# Patient Record
Sex: Female | Born: 1935 | Race: White | Hispanic: No | Smoking: Never smoker
Health system: Southern US, Community
[De-identification: ages and names within clinical notes are randomized; demographics above are authoritative.]

## PROBLEM LIST (undated history)

## (undated) DIAGNOSIS — R42 Dizziness and giddiness: Secondary | ICD-10-CM

## (undated) DIAGNOSIS — I2510 Atherosclerotic heart disease of native coronary artery without angina pectoris: Secondary | ICD-10-CM

## (undated) DIAGNOSIS — D649 Anemia, unspecified: Secondary | ICD-10-CM

## (undated) DIAGNOSIS — F32A Depression, unspecified: Secondary | ICD-10-CM

## (undated) DIAGNOSIS — I10 Essential (primary) hypertension: Secondary | ICD-10-CM

## (undated) DIAGNOSIS — I4891 Unspecified atrial fibrillation: Secondary | ICD-10-CM

## (undated) DIAGNOSIS — E785 Hyperlipidemia, unspecified: Secondary | ICD-10-CM

## (undated) DIAGNOSIS — R569 Unspecified convulsions: Secondary | ICD-10-CM

## (undated) DIAGNOSIS — F0390 Unspecified dementia without behavioral disturbance: Secondary | ICD-10-CM

## (undated) DIAGNOSIS — I639 Cerebral infarction, unspecified: Secondary | ICD-10-CM

## (undated) DIAGNOSIS — F329 Major depressive disorder, single episode, unspecified: Secondary | ICD-10-CM

## (undated) DIAGNOSIS — M1990 Unspecified osteoarthritis, unspecified site: Secondary | ICD-10-CM

## (undated) DIAGNOSIS — G459 Transient cerebral ischemic attack, unspecified: Secondary | ICD-10-CM

## (undated) HISTORY — DX: Major depressive disorder, single episode, unspecified: F32.9

## (undated) HISTORY — DX: Depression, unspecified: F32.A

## (undated) HISTORY — DX: Transient cerebral ischemic attack, unspecified: G45.9

## (undated) HISTORY — PX: APPENDECTOMY: SHX54

## (undated) HISTORY — PX: BACK SURGERY: SHX140

## (undated) HISTORY — DX: Unspecified convulsions: R56.9

## (undated) HISTORY — DX: Unspecified osteoarthritis, unspecified site: M19.90

## (undated) HISTORY — PX: ABDOMINAL HYSTERECTOMY: SHX81

---

## 2004-01-29 HISTORY — PX: COLONOSCOPY: SHX174

## 2011-02-08 DIAGNOSIS — I639 Cerebral infarction, unspecified: Secondary | ICD-10-CM

## 2011-02-08 HISTORY — DX: Cerebral infarction, unspecified: I63.9

## 2011-05-23 ENCOUNTER — Encounter (HOSPITAL_COMMUNITY): Payer: Self-pay | Admitting: *Deleted

## 2011-05-23 ENCOUNTER — Emergency Department (HOSPITAL_COMMUNITY): Payer: Medicare Other

## 2011-05-23 ENCOUNTER — Inpatient Hospital Stay (HOSPITAL_COMMUNITY)
Admission: EM | Admit: 2011-05-23 | Discharge: 2011-05-26 | DRG: 065 | Disposition: A | Discharge status: Skilled Nursing Facility | Payer: Medicare Other | Attending: Internal Medicine | Admitting: Internal Medicine

## 2011-05-23 DIAGNOSIS — Z7901 Long term (current) use of anticoagulants: Secondary | ICD-10-CM

## 2011-05-23 DIAGNOSIS — I10 Essential (primary) hypertension: Secondary | ICD-10-CM | POA: Diagnosis present

## 2011-05-23 DIAGNOSIS — E119 Type 2 diabetes mellitus without complications: Secondary | ICD-10-CM | POA: Diagnosis present

## 2011-05-23 DIAGNOSIS — I6340 Cerebral infarction due to embolism of unspecified cerebral artery: Principal | ICD-10-CM | POA: Diagnosis present

## 2011-05-23 DIAGNOSIS — D649 Anemia, unspecified: Secondary | ICD-10-CM | POA: Diagnosis present

## 2011-05-23 DIAGNOSIS — F0390 Unspecified dementia without behavioral disturbance: Secondary | ICD-10-CM | POA: Diagnosis present

## 2011-05-23 DIAGNOSIS — R29810 Facial weakness: Secondary | ICD-10-CM | POA: Diagnosis present

## 2011-05-23 DIAGNOSIS — E785 Hyperlipidemia, unspecified: Secondary | ICD-10-CM | POA: Diagnosis present

## 2011-05-23 DIAGNOSIS — I639 Cerebral infarction, unspecified: Secondary | ICD-10-CM | POA: Diagnosis present

## 2011-05-23 DIAGNOSIS — I2510 Atherosclerotic heart disease of native coronary artery without angina pectoris: Secondary | ICD-10-CM | POA: Diagnosis present

## 2011-05-23 DIAGNOSIS — Z79899 Other long term (current) drug therapy: Secondary | ICD-10-CM

## 2011-05-23 DIAGNOSIS — I4891 Unspecified atrial fibrillation: Secondary | ICD-10-CM | POA: Diagnosis present

## 2011-05-23 DIAGNOSIS — G459 Transient cerebral ischemic attack, unspecified: Secondary | ICD-10-CM

## 2011-05-23 DIAGNOSIS — E871 Hypo-osmolality and hyponatremia: Secondary | ICD-10-CM | POA: Diagnosis present

## 2011-05-23 DIAGNOSIS — I480 Paroxysmal atrial fibrillation: Secondary | ICD-10-CM | POA: Diagnosis present

## 2011-05-23 DIAGNOSIS — Z794 Long term (current) use of insulin: Secondary | ICD-10-CM

## 2011-05-23 DIAGNOSIS — Z7982 Long term (current) use of aspirin: Secondary | ICD-10-CM

## 2011-05-23 DIAGNOSIS — Z87440 Personal history of urinary (tract) infections: Secondary | ICD-10-CM

## 2011-05-23 DIAGNOSIS — I69959 Hemiplegia and hemiparesis following unspecified cerebrovascular disease affecting unspecified side: Secondary | ICD-10-CM

## 2011-05-23 HISTORY — DX: Unspecified atrial fibrillation: I48.91

## 2011-05-23 HISTORY — DX: Cerebral infarction, unspecified: I63.9

## 2011-05-23 HISTORY — DX: Anemia, unspecified: D64.9

## 2011-05-23 HISTORY — DX: Essential (primary) hypertension: I10

## 2011-05-23 HISTORY — DX: Hyperlipidemia, unspecified: E78.5

## 2011-05-23 HISTORY — DX: Atherosclerotic heart disease of native coronary artery without angina pectoris: I25.10

## 2011-05-23 LAB — COMPREHENSIVE METABOLIC PANEL
Alkaline Phosphatase: 85 U/L (ref 39–117)
CO2: 19 mEq/L (ref 19–32)
Chloride: 101 mEq/L (ref 96–112)
GFR calc Af Amer: 55 mL/min — ABNORMAL LOW (ref >90)
Potassium: 4 mEq/L (ref 3.5–5.1)
Sodium: 134 mEq/L — ABNORMAL LOW (ref 135–145)

## 2011-05-23 LAB — DIFFERENTIAL
Basophils Absolute: 0 10*3/uL (ref 0.0–0.1)
Basophils Relative: 0 % (ref 0–1)
Lymphocytes Relative: 22 % (ref 12–46)
Neutro Abs: 5.2 10*3/uL (ref 1.7–7.7)
Neutrophils Relative %: 71 % (ref 43–77)

## 2011-05-23 LAB — URINALYSIS, ROUTINE W REFLEX MICROSCOPIC
Glucose, UA: NEGATIVE mg/dL
Leukocytes, UA: NEGATIVE
Protein, ur: NEGATIVE mg/dL
Urobilinogen, UA: 0.2 mg/dL (ref 0.0–1.0)

## 2011-05-23 LAB — APTT: aPTT: 33 seconds (ref 24–37)

## 2011-05-23 LAB — CBC
HCT: 31.6 % — ABNORMAL LOW (ref 36.0–46.0)
Platelets: 222 10*3/uL (ref 150–400)
RDW: 14.5 % (ref 11.5–15.5)
WBC: 7.3 10*3/uL (ref 4.0–10.5)

## 2011-05-23 LAB — CK TOTAL AND CKMB (NOT AT ARMC)
CK, MB: 1.8 ng/mL (ref 0.3–4.0)
Total CK: 74 U/L (ref 7–177)

## 2011-05-23 LAB — PROTIME-INR
INR: 1.61 — ABNORMAL HIGH (ref 0.00–1.49)
Prothrombin Time: 19.4 seconds — ABNORMAL HIGH (ref 11.6–15.2)

## 2011-05-23 LAB — RAPID URINE DRUG SCREEN, HOSP PERFORMED: Amphetamines: NOT DETECTED

## 2011-05-23 LAB — GLUCOSE, CAPILLARY: Glucose-Capillary: 137 mg/dL — ABNORMAL HIGH (ref 70–99)

## 2011-05-23 LAB — TROPONIN I: Troponin I: <0.3 ng/mL (ref <0.30)

## 2011-05-23 MED ORDER — ONDANSETRON HCL 4 MG/2ML IJ SOLN
4.0000 mg | Freq: Once | INTRAMUSCULAR | Status: AC
  Administered 2011-05-23: 4 mg via INTRAVENOUS
  Filled 2011-05-23: qty 2

## 2011-05-23 NOTE — ED Notes (Signed)
Patient transported to CT 

## 2011-05-23 NOTE — ED Notes (Signed)
Patient returned from CT scan via stretcher.

## 2011-05-23 NOTE — ED Notes (Signed)
Difficulty with  Speech at home.  Onset 12 n

## 2011-05-23 NOTE — ED Provider Notes (Signed)
History    This chart was scribed for Hilario Quarry, MD, MD by Smitty Pluck. The patient was seen in room APA02 and the patient's care was started at 6:46PM.   CSN: [id number]  Arrival date & time 05/23/11  1758   First MD Initiated Contact with Patient 05/23/11 1834      No chief complaint on file.   (Consider location/radiation/quality/duration/timing/severity/associated sxs/prior treatment) The history is provided by the patient.   Helen [name] is a 78 y.o. female who presents to the Emergency Department complaining of possible TIA onset today around 2:00PM. Family reports that her right hand started to get numb and she had difficulty speaking. She had stroke in January 2013 (left side). Was at [facility]. She was just released from rehab 2 days ago. Home health nurse came today around 4:30PM and recommended that she should come to ED. She was diagnosed with UTI last week. She has hx of UTI. Pt is diabetic. Pt was normal until today. She reports that she has mild headache. Pt was ambulatory today.   Past Medical History  Diagnosis Date  . Stroke   . Atrial fibrillation   . Diabetes mellitus   . Hypertension   . Coronary artery disease     Past Surgical History  Procedure Date  . Appendectomy   . Abdominal hysterectomy     History reviewed. No pertinent family history.  History  Substance Use Topics  . Smoking status: Never Smoker   . Smokeless tobacco: Not on file  . Alcohol Use: No    OB History    Grav Para Term Preterm Abortions TAB SAB Ect Mult Living                  Review of Systems  All other systems reviewed and are negative.  10 Systems reviewed and all are negative for acute change except as noted in the HPI.    Allergies  Ciprofloxacin and Niacin and related  Home Medications  No current outpatient prescriptions on file.  BP 167/92  Pulse 70  Temp(Src) 98.9 F (37.2 C) (Oral)  Resp 20  Ht 5\' 4"  (1.626 m)  Wt 162 lb (73.483 kg)  BMI  27.81 kg/m2  SpO2 94%  Physical Exam  Nursing note and vitals reviewed. Constitutional: She is oriented to person, place, and time. She appears well-developed and well-nourished. No distress.  HENT:  Head: Normocephalic and atraumatic.  Cardiovascular: Normal rate, regular rhythm and normal heart sounds.   Pulmonary/Chest: Effort normal and breath sounds normal. No respiratory distress.  Abdominal: Soft. She exhibits no distension.  Neurological: She is alert and oriented to person, place, and time. No cranial nerve deficit.       No palmar drift   Skin: Skin is warm and dry.  Psychiatric: She has a normal mood and affect. Her behavior is normal.    ED Course  Procedures (including critical care time) DIAGNOSTIC STUDIES: Oxygen Saturation is 94% on [place], adequate by my interpretation.    COORDINATION OF CARE: 6:50PM EDP discusses pt ED treatment with pt and pt family  9:10PM EDP ordered medication: zofran 4 mg    Labs Reviewed  PROTIME-INR - Abnormal; Notable for the following:    Prothrombin Time 19.4 (*)    INR 1.61 (*)    All other components within normal limits  CBC - Abnormal; Notable for the following:    RBC 3.73 (*)    Hemoglobin 10.7 (*)    HCT 31.6 (*)  All other components within normal limits  COMPREHENSIVE METABOLIC PANEL - Abnormal; Notable for the following:    Sodium 134 (*)    Glucose, Bld 181 (*)    Total Bilirubin 0.2 (*)    GFR calc non Af Amer 47 (*)    GFR calc Af Amer 55 (*)    All other components within normal limits  GLUCOSE, CAPILLARY - Abnormal; Notable for the following:    Glucose-Capillary 137 (*)    All other components within normal limits  APTT  DIFFERENTIAL  CK TOTAL AND CKMB  TROPONIN I  URINE RAPID DRUG SCREEN (HOSP PERFORMED)  URINALYSIS, ROUTINE W REFLEX MICROSCOPIC   Ct Head Wo Contrast  05/23/2011  *RADIOLOGY REPORT*  Clinical Data: Right hand numbness.  Difficulty speaking. Confusion.  CT HEAD WITHOUT CONTRAST   Technique:  Contiguous axial images were obtained from the base of the skull through the vertex without contrast.  Comparison: None.  Findings: There is cortical atrophy with extensive hypoattenuation in the subcortical and periventricular deep white matter.  No evidence of acute infarction, hemorrhage, mass lesion, mass effect, midline shift or abnormal extra-axial fluid collection.  No hydrocephalus or pneumocephalus.  The calvarium is intact.  IMPRESSION: No acute finding.  Atrophy and chronic microvascular ischemic change.  Original Report Authenticated By: Bernadene Bell. Maricela Curet, M.D.     No diagnosis found.    MDM  Patient with normal exam here. CT of the head is negative. Patient is reported had a recent previous workup at outlying hospital for stroke. I discussed the patient's care with Dr. Rito Ehrlich on. She'll be admitted tonight for further evaluation an MRI in the morning.  Hilario Quarry, MD 05/23/11 2236

## 2011-05-23 NOTE — ED Notes (Signed)
Patient medicated for continued nausea and dry heaves.  Patient states she has a mild headache.

## 2011-05-23 NOTE — ED Notes (Signed)
Patient vomited small amount of clear liquid

## 2011-05-23 NOTE — ED Notes (Signed)
Patient given ice water per request and ok'd by Dr. Rosalia Hammers.

## 2011-05-24 ENCOUNTER — Inpatient Hospital Stay (HOSPITAL_COMMUNITY): Payer: Medicare Other

## 2011-05-24 ENCOUNTER — Encounter (HOSPITAL_COMMUNITY): Payer: Self-pay | Admitting: Internal Medicine

## 2011-05-24 ENCOUNTER — Inpatient Hospital Stay (HOSPITAL_COMMUNITY)
Admit: 2011-05-24 | Discharge: 2011-05-24 | Disposition: A | Discharge status: Home / Self Care | Payer: Medicare Other | Attending: Internal Medicine | Admitting: Internal Medicine

## 2011-05-24 DIAGNOSIS — Z7901 Long term (current) use of anticoagulants: Secondary | ICD-10-CM

## 2011-05-24 DIAGNOSIS — E119 Type 2 diabetes mellitus without complications: Secondary | ICD-10-CM | POA: Diagnosis present

## 2011-05-24 DIAGNOSIS — G459 Transient cerebral ischemic attack, unspecified: Secondary | ICD-10-CM

## 2011-05-24 DIAGNOSIS — I480 Paroxysmal atrial fibrillation: Secondary | ICD-10-CM | POA: Diagnosis present

## 2011-05-24 DIAGNOSIS — F0390 Unspecified dementia without behavioral disturbance: Secondary | ICD-10-CM | POA: Diagnosis present

## 2011-05-24 DIAGNOSIS — I10 Essential (primary) hypertension: Secondary | ICD-10-CM | POA: Diagnosis present

## 2011-05-24 DIAGNOSIS — I359 Nonrheumatic aortic valve disorder, unspecified: Secondary | ICD-10-CM

## 2011-05-24 DIAGNOSIS — D649 Anemia, unspecified: Secondary | ICD-10-CM | POA: Diagnosis present

## 2011-05-24 HISTORY — DX: Transient cerebral ischemic attack, unspecified: G45.9

## 2011-05-24 LAB — RETICULOCYTES
RBC.: 3.33 MIL/uL — ABNORMAL LOW (ref 3.87–5.11)
Retic Ct Pct: 1.2 % (ref 0.4–3.1)

## 2011-05-24 LAB — GLUCOSE, CAPILLARY
Glucose-Capillary: 119 mg/dL — ABNORMAL HIGH (ref 70–99)
Glucose-Capillary: 137 mg/dL — ABNORMAL HIGH (ref 70–99)
Glucose-Capillary: 149 mg/dL — ABNORMAL HIGH (ref 70–99)

## 2011-05-24 LAB — LIPID PANEL
Cholesterol: 223 mg/dL — ABNORMAL HIGH (ref 0–200)
Triglycerides: 156 mg/dL — ABNORMAL HIGH (ref <150)

## 2011-05-24 LAB — IRON AND TIBC
Saturation Ratios: 29 % (ref 20–55)
UIBC: 219 ug/dL (ref 125–400)

## 2011-05-24 LAB — HEMOGLOBIN A1C: Hgb A1c MFr Bld: 6.5 % — ABNORMAL HIGH (ref <5.7)

## 2011-05-24 MED ORDER — INSULIN ASPART 100 UNIT/ML ~~LOC~~ SOLN
0.0000 [IU] | Freq: Three times a day (TID) | SUBCUTANEOUS | Status: DC
  Administered 2011-05-24 – 2011-05-25 (×2): 2 [IU] via SUBCUTANEOUS
  Administered 2011-05-26: 3 [IU] via SUBCUTANEOUS

## 2011-05-24 MED ORDER — FUROSEMIDE 20 MG PO TABS
20.0000 mg | ORAL_TABLET | Freq: Every day | ORAL | Status: DC
  Administered 2011-05-24 – 2011-05-26 (×3): 20 mg via ORAL
  Filled 2011-05-24 (×3): qty 1

## 2011-05-24 MED ORDER — SODIUM CHLORIDE 0.9 % IJ SOLN
3.0000 mL | Freq: Two times a day (BID) | INTRAMUSCULAR | Status: DC
  Administered 2011-05-24 – 2011-05-26 (×5): 3 mL via INTRAVENOUS
  Filled 2011-05-24 (×6): qty 3

## 2011-05-24 MED ORDER — LEVETIRACETAM 500 MG PO TABS
500.0000 mg | ORAL_TABLET | Freq: Two times a day (BID) | ORAL | Status: DC
  Administered 2011-05-24 – 2011-05-26 (×6): 500 mg via ORAL
  Filled 2011-05-24 (×6): qty 1

## 2011-05-24 MED ORDER — INSULIN ASPART 100 UNIT/ML ~~LOC~~ SOLN: 0.0000 [IU] | Freq: Every day | SUBCUTANEOUS | Status: DC

## 2011-05-24 MED ORDER — ATORVASTATIN CALCIUM 10 MG PO TABS
10.0000 mg | ORAL_TABLET | Freq: Every day | ORAL | Status: DC
  Administered 2011-05-24 – 2011-05-25 (×2): 10 mg via ORAL
  Filled 2011-05-24 (×2): qty 1

## 2011-05-24 MED ORDER — HYDRALAZINE HCL 25 MG PO TABS
50.0000 mg | ORAL_TABLET | Freq: Three times a day (TID) | ORAL | Status: DC
  Administered 2011-05-24: 50 mg via ORAL
  Filled 2011-05-24 (×2): qty 2

## 2011-05-24 MED ORDER — WARFARIN - PHARMACIST DOSING INPATIENT
Freq: Every day | Status: DC
  Administered 2011-05-24: 18:00:00

## 2011-05-24 MED ORDER — WARFARIN SODIUM 7.5 MG PO TABS: 7.5000 mg | ORAL_TABLET | Freq: Once | ORAL | Status: DC

## 2011-05-24 MED ORDER — METOPROLOL TARTRATE 25 MG PO TABS
25.0000 mg | ORAL_TABLET | Freq: Two times a day (BID) | ORAL | Status: DC
  Administered 2011-05-24: 25 mg via ORAL
  Filled 2011-05-24 (×2): qty 1

## 2011-05-24 MED ORDER — WARFARIN SODIUM 5 MG PO TABS
5.0000 mg | ORAL_TABLET | Freq: Once | ORAL | Status: AC
  Administered 2011-05-24: 5 mg via ORAL
  Filled 2011-05-24: qty 1

## 2011-05-24 MED ORDER — ASPIRIN 325 MG PO TABS
325.0000 mg | ORAL_TABLET | Freq: Every day | ORAL | Status: DC
  Administered 2011-05-24 – 2011-05-25 (×2): 325 mg via ORAL
  Filled 2011-05-24 (×2): qty 1

## 2011-05-24 MED ORDER — ESCITALOPRAM OXALATE 10 MG PO TABS
5.0000 mg | ORAL_TABLET | Freq: Every day | ORAL | Status: DC
  Administered 2011-05-24 – 2011-05-26 (×3): 5 mg via ORAL
  Filled 2011-05-24 (×3): qty 1

## 2011-05-24 MED ORDER — DONEPEZIL HCL 5 MG PO TABS
10.0000 mg | ORAL_TABLET | Freq: Every day | ORAL | Status: DC
  Administered 2011-05-24 – 2011-05-25 (×2): 10 mg via ORAL
  Filled 2011-05-24 (×2): qty 2

## 2011-05-24 MED ORDER — INSULIN GLARGINE 100 UNIT/ML ~~LOC~~ SOLN
10.0000 [IU] | Freq: Every day | SUBCUTANEOUS | Status: DC
  Administered 2011-05-24 – 2011-05-25 (×2): 10 [IU] via SUBCUTANEOUS

## 2011-05-24 MED ORDER — LISINOPRIL 10 MG PO TABS
40.0000 mg | ORAL_TABLET | Freq: Every day | ORAL | Status: DC
Start: 2011-05-24 — End: 2011-05-24
  Filled 2011-05-24: qty 4

## 2011-05-24 MED ORDER — POTASSIUM CHLORIDE CRYS ER 20 MEQ PO TBCR
20.0000 meq | EXTENDED_RELEASE_TABLET | Freq: Every day | ORAL | Status: DC
  Administered 2011-05-24 – 2011-05-26 (×3): 20 meq via ORAL
  Filled 2011-05-24 (×3): qty 1

## 2011-05-24 MED ORDER — FOLIC ACID 1 MG PO TABS
1.0000 mg | ORAL_TABLET | Freq: Every day | ORAL | Status: DC
  Administered 2011-05-24 – 2011-05-26 (×3): 1 mg via ORAL
  Filled 2011-05-24 (×3): qty 1

## 2011-05-24 MED ORDER — ACETAMINOPHEN 325 MG PO TABS: 650.0000 mg | ORAL_TABLET | ORAL | Status: DC | PRN

## 2011-05-24 NOTE — Evaluation (Signed)
Occupational Therapy Evaluation Patient Details Name: Helen [name] MRN: [id number] DOB: December 07, 1924 Today's Date: 05/24/2011 OT Eval [id number] Problem List:  Patient Active Problem List  Diagnoses  . TIA (transient ischemic attack)  . Dementia  . DM type 2 (diabetes mellitus, type 2)  . HTN (hypertension)  . Paroxysmal atrial fibrillation  . Anticoagulant long-term use  . Anemia    Past Medical History:  Past Medical History  Diagnosis Date  . Stroke   . Atrial fibrillation   . Diabetes mellitus   . Hypertension   . Coronary artery disease    Past Surgical History:  Past Surgical History  Procedure Date  . Appendectomy   . Abdominal hysterectomy     OT Assessment/Plan/Recommendation OT Assessment Clinical Impression Statement: A:  Patient presents with decreased ability to process information, complete BADLs, and decreased safety due to CVA vs TIA. OT Recommendation/Assessment: Patient will need skilled OT in the acute care venue OT Problem List: Decreased activity tolerance;Impaired balance (sitting and/or standing);Decreased coordination;Decreased cognition;Decreased knowledge of precautions;Impaired UE functional use;Impaired tone OT Therapy Diagnosis : Apraxia OT Plan OT Frequency: Min 2X/week OT Treatment/Interventions: Self-care/ADL training;Therapeutic exercise;Neuromuscular education;Therapeutic activities;Patient/family education;Balance training OT Recommendation Recommendations for Other Services: Speech consult Follow Up Recommendations: Inpatient Rehab Equipment Recommended: Defer to next venue Individuals Consulted Consulted and Agree with Results and Recommendations: Patient;Family member/caregiver Family Member Consulted: son present for beginning of treatment session OT Goals Acute Rehab OT Goals OT Goal Formulation: With patient Time For Goal Achievement: 2 weeks ADL Goals Pt Will Perform Grooming: with set-up;Sitting at sink ADL Goal: Grooming -  Progress: Goal set today Pt Will Perform Upper Body Dressing: with supervision;Sitting, chair ADL Goal: Upper Body Dressing - Progress: Goal set today Pt Will Perform Lower Body Dressing: with set-up ADL Goal: Lower Body Dressing - Progress: Goal set today Miscellaneous OT Goals Miscellaneous OT Goal #1: Patient will improve GMC in BUE from fair to good for increased ability to complete BADLs independently and safely. OT Goal: Miscellaneous Goal #1 - Progress: Goal set today  OT Evaluation Precautions/Restrictions  Precautions Precautions: Fall Restrictions Weight Bearing Restrictions: No Prior Functioning Home Living Lives With: Son Available Help at Discharge: Family Type of Home: House Home Access: Stairs to enter Secretary/administrator of Steps: 5 Entrance Stairs-Rails: Right Home Layout: One level Firefighter: Standard Home Adaptive Equipment: Walker - rolling Prior Function Level of Independence: Needs assistance Able to Take Stairs?: Yes Driving: No Vocation: Retired Comments: Helen [name] was discharged from rehabilitation at [facility] on Saturday at [facility] level for ADLs, according to their records.    ADL ADL Grooming: Performed;Supervision/safety Grooming Details (indicate cue type and reason): OTR/L handed comb to patient.  She was able to comb hair with vg to remain on task.  She repeately closed her eyes and drifted off to sleep while combing her hair. Where Assessed - Grooming: Sitting, chair Lower Body Dressing: Performed;Supervision/safety Lower Body Dressing Details (indicate cue type and reason): Patient doffed R slipper sock with SBA for safety.  When prompted to don the sock, she began to put it on her left foot, which already had a sock on it.  I cued her to place the sock on her right foot and she was then able to do so with SBA for safety. Where Assessed - Lower Body Dressing: Sitting, chair ADL Comments: Patient very lethargic during evaluation.  I  requested that she wash her face, however she declined doing so. Vision/Perception  Vision - History Baseline Vision: Wears  glasses all the time Cognition Cognition Arousal/Alertness: Awake/alert Overall Cognitive Status: Appears within functional limits for tasks assessed Orientation Level: Oriented X4 Cognition - Other Comments: history of dementia.  When asked to state her name and the current month she was able to do so.  When asked our current location, her answer came out very jumbled.  I gave her 2 choices ([place] or [facility]), she was then able to clearly state [place] [facility]. Sensation/Coordination Sensation Light Touch: Appears Intact Coordination Gross Motor Movements are Fluid and Coordinated: No (Helen [name] had difficulty smoothly reaching out in front of) Fine Motor Movements are Fluid and Coordinated: No Coordination and Movement Description: Patient had difficulty smoothly reaching out in front of her body and touching various body parts when directed by therapist.  Dysdiadochokinesia present L>R with rapid supination-pronation assessment. Finger Nose Finger Test: very slow, unable to correctly reach to therapist's finger, as her eyes were closed.  When prompted to keep eyes open and look for target of therapists fingers, she was still unable to do so accurately. Extremity Assessment RUE Assessment RUE Assessment: Exceptions to [facility] RUE AROM (degrees) RUE Overall AROM Comments: Bilateral AROM at times was [facility] and at times decreased to 50%, may be due to fatigue or difficulty processing information, exact reason unclear. RUE Strength RUE Overall Strength Comments: bilateral grip is equal and WFL. RUE Tone RUE Tone Comments: BUE Brunnstrom stage V. LUE Assessment LUE Assessment: Not tested Mobility  Bed Mobility Bed Mobility: Yes Rolling Right: 7: Independent Rolling Left: 7: Independent Sitting - Scoot to Edge of Bed: 6: Modified independent  (Device/Increase time) Transfers Sit to Stand: 6: Modified independent (Device/Increase time) Stand to Sit: 7: Independent Exercises   End of Session OT - End of Session Activity Tolerance: Patient limited by fatigue Patient left: in chair Nurse Communication: Other (comment) (Notified RN that patient was in chair and drifting to sleep.) General Behavior During Session: Lethargic Cognition: Impaired, at baseline   Shirlean Mylar, OTR/L  05/24/2011, 12:02 PM

## 2011-05-24 NOTE — H&P (Signed)
Helen [name] is an 78 y.o. female.    PCP: Arelia Sneddon, MD, MD   Chief Complaint: Difficulty in speech  HPI: This is 78 year old, white female with a past medical history of dementia, hypertension, diabetes, atrial fibrillation, recent stroke, who was in her usual state of health earlier today when she apparently had the numbness in the right hand and had difficulty speaking. Patient apparently, had stroke in January and was admitted at [facility]. She went to rehabilitation and was released from rehabilitation about 2 days ago. Home health nurse came to see the patient today, and when the symptoms were reported to her she recommended that the patient come in to the emergency department. Patient was apparently treated for a urinary tract infection last week. She reported to the emergency department that she had a mild headache. Currently, patient does not answer many questions. She is cooperative with examination but doesn't want to speak. It is could this could be because of her previous stroke, although not completely aware of her baseline. Hence history is limited as no family is available either. Pt was ambulatory today.  Home Medications: Prior to Admission medications   Medication Sig Start Date End Date Taking? Authorizing Provider  aspirin EC 81 MG tablet Take 81 mg by mouth every morning. At 700   Yes Historical Provider, MD  B Complex-C (SUPER B COMPLEX) TABS Take 1 tablet by mouth every morning. At 700   Yes Historical Provider, MD  calcium carbonate (OS-CAL) 600 MG TABS Take 600 mg by mouth 2 (two) times daily with a meal.   Yes Historical Provider, MD  donepezil (ARICEPT) 10 MG tablet Take 10 mg by mouth.   Yes Historical Provider, MD  escitalopram (LEXAPRO) 5 MG tablet Take 5 mg by mouth every morning.   Yes Historical Provider, MD  folic acid (FOLVITE) 1 MG tablet Take 1 mg by mouth every morning. At 700   Yes Historical Provider, MD  furosemide (LASIX) 20 MG  tablet Take 20 mg by mouth every morning. At 700   Yes Historical Provider, MD  hydrALAZINE (APRESOLINE) 50 MG tablet Take 50 mg by mouth every 8 (eight) hours.   Yes Historical Provider, MD  insulin glargine (LANTUS SOLOSTAR) 100 UNIT/ML injection Inject 20 Units into the skin at bedtime as needed. **If blood sugars are less than 200--DO NOT GIVE INJECTION**   Yes Historical Provider, MD  levETIRAcetam (KEPPRA) 500 MG tablet Take 500 mg by mouth 2 (two) times daily.   Yes Historical Provider, MD  lisinopril (PRINIVIL,ZESTRIL) 40 MG tablet Take 40 mg by mouth every morning. At 700   Yes Historical Provider, MD  metoprolol tartrate (LOPRESSOR) 25 MG tablet Take 25 mg by mouth 2 (two) times daily.   Yes Historical Provider, MD  Multiple Vitamins-Minerals (CENTRUM SILVER ULTRA WOMENS PO) Take 1 tablet by mouth every morning. At 700   Yes Historical Provider, MD  oxybutynin (DITROPAN-XL) 10 MG 24 hr tablet Take 10 mg by mouth every morning. At 700   Yes Historical Provider, MD  potassium chloride SA (K-DUR,KLOR-CON) 20 MEQ tablet Take 20 mEq by mouth every morning. At 700   Yes Historical Provider, MD  sulfamethoxazole-trimethoprim (BACTRIM DS,SEPTRA DS) 800-160 MG per tablet Take 1 tablet by mouth 2 (two) times daily.   Yes Historical Provider, MD  warfarin (COUMADIN) 2.5 MG tablet Take 2.5 mg by mouth daily. Take one tablet in addition to Coumadin 4mg  on Thursdays and Saturdays   Yes Historical Provider, MD  warfarin (COUMADIN) 3 MG tablet Take 3 mg by mouth See admin instructions. Take one tablet in addition to Coumadin 4mg  for  A total of 7mg  on Mondays, Tuesdays, Wednesdays, Fridays, and Sundays of each week.   Yes Historical Provider, MD  warfarin (COUMADIN) 4 MG tablet Take 4 mg by mouth See admin instructions. Take one tablet by mouth in addition to Coumadin 2.5mg  for a total of 6.5 mg on Thursdays and Saturdays.   Yes Historical Provider, MD  zolpidem (AMBIEN) 5 MG tablet Take 5 mg by mouth at  bedtime.   Yes Historical Provider, MD    Allergies:  Allergies  Allergen Reactions  . Ciprofloxacin     Patient states that she may have had a stroke due to this medication  . Niacin And Related Hives    Past Medical History: Past Medical History  Diagnosis Date  . Stroke   . Atrial fibrillation   . Diabetes mellitus   . Hypertension   . Coronary artery disease     Past Surgical History  Procedure Date  . Appendectomy   . Abdominal hysterectomy     Social History:  reports that she has never smoked. She does not have any smokeless tobacco history on file. She reports that she does not drink alcohol or use illicit drugs.  Family History: History reviewed. No pertinent family history.  Review of Systems - unobtainable from patient due to mental status  Physical Examination Blood pressure 149/70, pulse 80, temperature 99.3 F (37.4 C), temperature source Oral, resp. rate 18, height 5\' 5"  (1.651 m), weight 73.3 kg (161 lb 9.6 oz), SpO2 96.00%.  General appearance: alert, cooperative and no distress Head: Normocephalic, without obvious abnormality, atraumatic Eyes: conjunctivae/corneas clear. PERRL, EOM's intact. Fundi benign. Throat: lips, mucosa, and tongue normal; teeth and gums normal Neck: no adenopathy, no carotid bruit, no JVD, supple, symmetrical, trachea midline and thyroid not enlarged, symmetric, no tenderness/mass/nodules Resp: clear to auscultation bilaterally Cardio: regular rate and rhythm, S1, S2 normal, no murmur, click, rub or gallop GI: soft, non-tender; bowel sounds normal; no masses,  no organomegaly Extremities: extremities normal, atraumatic, no cyanosis or edema Pulses: 2+ and symmetric Skin: Skin color, texture, turgor normal. No rashes or lesions Lymph nodes: Cervical, supraclavicular, and axillary nodes normal. Neurologic: Crania nerves appear to be intact. However, the patient refused to cooperate with part of the examination. She does have  equal strength bilaterally. Reflexes were brisk on the left side. No obvious pronator drift was identified. Exam is limited.  Laboratory Data: Results for orders placed during the hospital encounter of 05/23/11 (from the past 48 hour(s))  GLUCOSE, CAPILLARY     Status: Abnormal   Collection Time   05/23/11  7:06 PM      Component Value Range Comment   Glucose-Capillary 137 (*) 70 - 99 (mg/dL)   URINE RAPID DRUG SCREEN (HOSP PERFORMED)     Status: Normal   Collection Time   05/23/11  7:24 PM      Component Value Range Comment   Opiates NONE DETECTED  NONE DETECTED     Cocaine NONE DETECTED  NONE DETECTED     Benzodiazepines NONE DETECTED  NONE DETECTED     Amphetamines NONE DETECTED  NONE DETECTED     Tetrahydrocannabinol NONE DETECTED  NONE DETECTED     Barbiturates NONE DETECTED  NONE DETECTED    URINALYSIS, ROUTINE W REFLEX MICROSCOPIC     Status: Normal   Collection Time   05/23/11  7:24 PM      Component Value Range Comment   Color, Urine YELLOW  YELLOW     APPearance CLEAR  CLEAR     Specific Gravity, Urine 1.010  1.005 - 1.030     pH 7.0  5.0 - 8.0     Glucose, UA NEGATIVE  NEGATIVE (mg/dL)    Hgb urine dipstick NEGATIVE  NEGATIVE     Bilirubin Urine NEGATIVE  NEGATIVE     Ketones, ur NEGATIVE  NEGATIVE (mg/dL)    Protein, ur NEGATIVE  NEGATIVE (mg/dL)    Urobilinogen, UA 0.2  0.0 - 1.0 (mg/dL)    Nitrite NEGATIVE  NEGATIVE     Leukocytes, UA NEGATIVE  NEGATIVE  MICROSCOPIC NOT DONE ON URINES WITH NEGATIVE PROTEIN, BLOOD, LEUKOCYTES, NITRITE, OR GLUCOSE <1000 mg/dL.  PROTIME-INR     Status: Abnormal   Collection Time   05/23/11  7:55 PM      Component Value Range Comment   Prothrombin Time 19.4 (*) 11.6 - 15.2 (seconds)    INR 1.61 (*) 0.00 - 1.49    APTT     Status: Normal   Collection Time   05/23/11  7:55 PM      Component Value Range Comment   aPTT 33  24 - 37 (seconds)   CBC     Status: Abnormal   Collection Time   05/23/11  7:55 PM      Component Value Range  Comment   WBC 7.3  4.0 - 10.5 (K/uL)    RBC 3.73 (*) 3.87 - 5.11 (MIL/uL)    Hemoglobin 10.7 (*) 12.0 - 15.0 (g/dL)    HCT 16.1 (*) 09.6 - 46.0 (%)    MCV 84.7  78.0 - 100.0 (fL)    MCH 28.7  26.0 - 34.0 (pg)    MCHC 33.9  30.0 - 36.0 (g/dL)    RDW 04.5  40.9 - 81.1 (%)    Platelets 222  150 - 400 (K/uL)   DIFFERENTIAL     Status: Normal   Collection Time   05/23/11  7:55 PM      Component Value Range Comment   Neutrophils Relative 71  43 - 77 (%)    Neutro Abs 5.2  1.7 - 7.7 (K/uL)    Lymphocytes Relative 22  12 - 46 (%)    Lymphs Abs 1.6  0.7 - 4.0 (K/uL)    Monocytes Relative 6  3 - 12 (%)    Monocytes Absolute 0.5  0.1 - 1.0 (K/uL)    Eosinophils Relative 1  0 - 5 (%)    Eosinophils Absolute 0.1  0.0 - 0.7 (K/uL)    Basophils Relative 0  0 - 1 (%)    Basophils Absolute 0.0  0.0 - 0.1 (K/uL)   COMPREHENSIVE METABOLIC PANEL     Status: Abnormal   Collection Time   05/23/11  7:55 PM      Component Value Range Comment   Sodium 134 (*) 135 - 145 (mEq/L)    Potassium 4.0  3.5 - 5.1 (mEq/L)    Chloride 101  96 - 112 (mEq/L)    CO2 19  19 - 32 (mEq/L)    Glucose, Bld 181 (*) 70 - 99 (mg/dL)    BUN 19  6 - 23 (mg/dL)    Creatinine, Ser 9.14  0.50 - 1.10 (mg/dL)    Calcium 9.6  8.4 - 10.5 (mg/dL)    Total Protein 7.1  6.0 - 8.3 (g/dL)  Albumin 3.6  3.5 - 5.2 (g/dL)    AST 18  0 - 37 (U/L)    ALT 13  0 - 35 (U/L)    Alkaline Phosphatase 85  39 - 117 (U/L)    Total Bilirubin 0.2 (*) 0.3 - 1.2 (mg/dL)    GFR calc non Af Amer 47 (*) >90 (mL/min)    GFR calc Af Amer 55 (*) >90 (mL/min)   CK TOTAL AND CKMB     Status: Normal   Collection Time   05/23/11  7:55 PM      Component Value Range Comment   Total CK 74  7 - 177 (U/L)    CK, MB 1.8  0.3 - 4.0 (ng/mL)    Relative Index RELATIVE INDEX IS INVALID  0.0 - 2.5    TROPONIN I     Status: Normal   Collection Time   05/23/11  7:55 PM      Component Value Range Comment   Troponin I <0.30  <0.30 (ng/mL)     Radiology  Reports: Ct Head Wo Contrast  05/23/2011  *RADIOLOGY REPORT*  Clinical Data: Right hand numbness.  Difficulty speaking. Confusion.  CT HEAD WITHOUT CONTRAST  Technique:  Contiguous axial images were obtained from the base of the skull through the vertex without contrast.  Comparison: None.  Findings: There is cortical atrophy with extensive hypoattenuation in the subcortical and periventricular deep white matter.  No evidence of acute infarction, hemorrhage, mass lesion, mass effect, midline shift or abnormal extra-axial fluid collection.  No hydrocephalus or pneumocephalus.  The calvarium is intact.  IMPRESSION: No acute finding.  Atrophy and chronic microvascular ischemic change.  Original Report Authenticated By: Bernadene Bell. Maricela Curet, M.D.    Electrocardiogram: Sinus rhythm at 75 beats per minute. Left axis deviation. Normal Intervals. No definite Q waves. No concerning ST changes. Poor R wave progression. Nonspecific T wave, changes.  Assessment/Plan  Active Problems:  TIA (transient ischemic attack)  Dementia  DM type 2 (diabetes mellitus, type 2)  HTN (hypertension)  Paroxysmal atrial fibrillation  Anticoagulant long-term use  Anemia   #1 TIA versus stroke: We'll get MRI of her brain tomorrow. We'll get records from [facility]. For now continue with warfarin, as well as aspirin. Physical therapy and occupational therapy will be asked to see the patient. Speech therapy will also be involved. Swallow screen will be obtained.  #2 diabetes, type II: We'll get HbA1c. We'll put her on a sliding scale. It is unclear how frequently she takes Lantus.  #3 hypertension: Monitor blood pressures closely.  #4 history of paroxysmal A. fib: She is currently in sinus rhythm. We'll continue with warfarin for now.  #5 anemia: We'll check an anemia panel.  #6 dementia: Continue with Aricept.  #7 possible history of seizures as she is on Keppra: Will get EEG. Continue with Keppra.  CODE  STATUS to be clarified with family. She's a full code for now.  Further management decisions will depend on results of further testing and patient's response to treatment  [facility]  [facility] Pager [phone]  05/24/2011, 12:26 AM

## 2011-05-24 NOTE — Progress Notes (Signed)
ANTICOAGULATION CONSULT NOTE - Initial Consult  Pharmacy Consult for warfarin Indication: afib anticoagulant prophylaxis  Allergies  Allergen Reactions  . Ciprofloxacin     Patient states that she may have had a stroke due to this medication  . Niacin And Related Hives    Patient Measurements: Height: 5\' 5"  (165.1 cm) Weight: 161 lb 9.6 oz (73.3 kg) IBW/kg (Calculated) : 57   Vital Signs: Temp: 99.3 F (37.4 C) (04/15 2330) Temp src: Oral (04/15 2330) BP: 149/70 mmHg (04/15 2330) Pulse Rate: 80  (04/15 2330)  Labs:  [facility] 05/23/11 1955  HGB 10.7*  HCT 31.6*  PLT 222  APTT 33  LABPROT 19.4*  INR 1.61*  HEPARINUNFRC --  CREATININE 1.05  CKTOTAL 74  CKMB 1.8  TROPONINI <0.30   Estimated Creatinine Clearance: 40 ml/min (by C-G formula based on Cr of 1.05).  Medical History: Past Medical History  Diagnosis Date  . Stroke   . Atrial fibrillation   . Diabetes mellitus   . Hypertension   . Coronary artery disease     Medications:     By history, patient takes warfarin 7mg  daily on Mon/Tue/Wed/Fri/Sun  And 6.5mg  daily on Thur/Sat.  Patient's last dose was Sunday.  Assessment:    Anticoagulation coverage for afib  Goal of Therapy:     Continue with initial dose warfarin in-house tonight.  Will follow daily INR/PT to keep INR range 2-3.  Note that with advanced age, INR changes may be delayed until 2nd-3rd day post dose.   Plan:  1.  One time dose 7.5mg  now. 2.  Daily INR   Helen [name], [name] 05/24/2011,12:56 AM

## 2011-05-24 NOTE — Progress Notes (Signed)
SLP Cancellation Note  Treatment cancelled today due to patient receiving procedure or test.  Helen [name] 05/24/2011, 7:28 PM

## 2011-05-24 NOTE — Progress Notes (Signed)
*  PRELIMINARY RESULTS* Echocardiogram 2D Echocardiogram has been performed.  Helen [name] 05/24/2011, 5:17 PM

## 2011-05-24 NOTE — Consult Note (Signed)
ANTICOAGULATION CONSULT NOTE - Initial Consult  Pharmacy Consult for Warfarin Indication: atrial fibrillation  Allergies  Allergen Reactions  . Ciprofloxacin     Patient states that she may have had a stroke due to this medication  . Niacin And Related Hives   Patient Measurements: Height: 5\' 5"  (165.1 cm) Weight: 161 lb 9.6 oz (73.3 kg) IBW/kg (Calculated) : 57   Vital Signs: Temp: 98.9 F (37.2 C) (04/16 0500) Temp src: Oral (04/16 0500) BP: 102/64 mmHg (04/16 0956) Pulse Rate: 58  (04/16 0956)  Labs:  Basename 05/24/11 0500 05/23/11 1955  HGB -- 10.7*  HCT -- 31.6*  PLT -- 222  APTT -- 33  LABPROT 20.8* 19.4*  INR 1.76* 1.61*  HEPARINUNFRC -- --  CREATININE -- 1.05  CKTOTAL -- 74  CKMB -- 1.8  TROPONINI -- <0.30   Estimated Creatinine Clearance: 40 ml/min (by C-G formula based on Cr of 1.05).  Medical History: Past Medical History  Diagnosis Date  . Stroke   . Atrial fibrillation   . Diabetes mellitus   . Hypertension   . Coronary artery disease    Medications:  Prescriptions prior to admission  Medication Sig Dispense Refill  . aspirin EC 81 MG tablet Take 81 mg by mouth every morning. At 700      . B Complex-C (SUPER B COMPLEX) TABS Take 1 tablet by mouth every morning. At 700      . calcium carbonate (OS-CAL) 600 MG TABS Take 600 mg by mouth 2 (two) times daily with a meal.      . donepezil (ARICEPT) 10 MG tablet Take 10 mg by mouth.      . escitalopram (LEXAPRO) 5 MG tablet Take 5 mg by mouth every morning.      . folic acid (FOLVITE) 1 MG tablet Take 1 mg by mouth every morning. At 700      . furosemide (LASIX) 20 MG tablet Take 20 mg by mouth every morning. At 700      . hydrALAZINE (APRESOLINE) 50 MG tablet Take 50 mg by mouth every 8 (eight) hours.      . insulin glargine (LANTUS SOLOSTAR) 100 UNIT/ML injection Inject 20 Units into the skin at bedtime as needed. **If blood sugars are less than 200--DO NOT GIVE INJECTION**      . levETIRAcetam  (KEPPRA) 500 MG tablet Take 500 mg by mouth 2 (two) times daily.      Marland Kitchen lisinopril (PRINIVIL,ZESTRIL) 40 MG tablet Take 40 mg by mouth every morning. At 700      . metoprolol tartrate (LOPRESSOR) 25 MG tablet Take 25 mg by mouth 2 (two) times daily.      . Multiple Vitamins-Minerals (CENTRUM SILVER ULTRA WOMENS PO) Take 1 tablet by mouth every morning. At 700      . oxybutynin (DITROPAN-XL) 10 MG 24 hr tablet Take 10 mg by mouth every morning. At 700      . potassium chloride SA (K-DUR,KLOR-CON) 20 MEQ tablet Take 20 mEq by mouth every morning. At 700      . sulfamethoxazole-trimethoprim (BACTRIM DS,SEPTRA DS) 800-160 MG per tablet Take 1 tablet by mouth 2 (two) times daily.      Marland Kitchen warfarin (COUMADIN) 2.5 MG tablet Take 2.5 mg by mouth daily. Take one tablet in addition to Coumadin 4mg  on Thursdays and Saturdays      . warfarin (COUMADIN) 3 MG tablet Take 3 mg by mouth See admin instructions. Take one tablet in addition to Coumadin  4mg  for  A total of 7mg  on Mondays, Tuesdays, Wednesdays, Fridays, and Sundays of each week.      . warfarin (COUMADIN) 4 MG tablet Take 4 mg by mouth See admin instructions. Take one tablet by mouth in addition to Coumadin 2.5mg  for a total of 6.5 mg on Thursdays and Saturdays.      Marland Kitchen zolpidem (AMBIEN) 5 MG tablet Take 5 mg by mouth at bedtime.       Assessment: INR below target Complicated home dosing regimen  Goal of Therapy:  INR 2-3   Plan: Coumadin 5mg  today x 1 INR daily Simplify regimen if possible  Valrie Hart A 05/24/2011,11:11 AM

## 2011-05-24 NOTE — Progress Notes (Signed)
Subjective: Admitted last night with stroke like symptoms.    Patient recently was released from rehabilitation after being at [facility] in [place] with a stroke. Patient was living with a son who works during the day. Son states they had been unpacking boxes when the patient started complaining of right hand numbness. He attributed this to her carpal tunnel syndrome. He became more concerned when she started developing slurred speech and right sided facial weakness. He checked her blood sugar that time and it was 128. Home health nurse came and checked her INR is 1.5. Patient was then brought to the ER for further evaluation.  Patient is currently sitting in chair with no complaints. She answers questions appropriately denies chest pain denies fever denies shortness of breath. Eating lunch with no problems  Objective: Vital signs in last 24 hours: Filed Vitals:   05/23/11 2330 05/24/11 0130 05/24/11 0500 05/24/11 0956  BP: 149/70 133/67 95/60 102/64  Pulse: 80 88 73 58  Temp: 99.3 F (37.4 C) 98.5 F (36.9 C) 98.9 F (37.2 C)   TempSrc: Oral Oral Oral   Resp: 18 18 18    Height: 5\' 5"  (1.651 m)     Weight: 73.3 kg (161 lb 9.6 oz)     SpO2: 96% 98% 98% 96%   Weight change:   Intake/Output Summary (Last 24 hours) at 05/24/11 1153 Last data filed at 05/24/11 0900  Gross per 24 hour  Intake    180 ml  Output      0 ml  Net    180 ml    Physical Exam: General: Awake,sleepy, No acute distress. HEENT: EOMI. Neck: Supple CV: appeared to be NSR Lungs: Clear to ascultation bilaterally, no wheezing Abdomen: Soft, Nontender, Nondistended, +bowel sounds. Ext: Good pulses. Trace edema, moves all 4 extremities with no focal deficit, generalized weakness   Lab Results:  [facility] 05/23/11 1955  NA 134*  K 4.0  CL 101  CO2 19  GLUCOSE 181*  BUN 19  CREATININE 1.05  CALCIUM 9.6  MG --  PHOS --    Basename 05/23/11 1955  AST 18  ALT 13  ALKPHOS 85  BILITOT 0.2*   PROT 7.1  ALBUMIN 3.6   No results found for this basename: LIPASE:2,AMYLASE:2 in the last 72 hours  Basename 05/23/11 1955  WBC 7.3  NEUTROABS 5.2  HGB 10.7*  HCT 31.6*  MCV 84.7  PLT 222    Basename 05/23/11 1955  CKTOTAL 74  CKMB 1.8  CKMBINDEX --  TROPONINI <0.30   No components found with this basename: POCBNP:3 No results found for this basename: DDIMER:2 in the last 72 hours No results found for this basename: HGBA1C:2 in the last 72 hours  Basename 05/24/11 0500  CHOL 223*  HDL 44  LDLCALC 148*  TRIG 156*  CHOLHDL 5.1  LDLDIRECT --   No results found for this basename: TSH,T4TOTAL,FREET3,T3FREE,THYROIDAB in the last 72 hours  Basename 05/24/11 0500  VITAMINB12 --  FOLATE --  FERRITIN --  TIBC --  IRON --  RETICCTPCT 1.2    Micro Results: No results found for this or any previous visit (from the past 240 hour(s)).  Studies/Results: Ct Head Wo Contrast  05/23/2011  *RADIOLOGY REPORT*  Clinical Data: Right hand numbness.  Difficulty speaking. Confusion.  CT HEAD WITHOUT CONTRAST  Technique:  Contiguous axial images were obtained from the base of the skull through the vertex without contrast.  Comparison: None.  Findings: There is cortical atrophy with extensive hypoattenuation  in the subcortical and periventricular deep white matter.  No evidence of acute infarction, hemorrhage, mass lesion, mass effect, midline shift or abnormal extra-axial fluid collection.  No hydrocephalus or pneumocephalus.  The calvarium is intact.  IMPRESSION: No acute finding.  Atrophy and chronic microvascular ischemic change.  Original Report Authenticated By: Bernadene Bell. Maricela Curet, M.D.    Medications: I have reviewed the patient's current medications. Scheduled Meds:   . aspirin  325 mg Oral Daily  . donepezil  10 mg Oral QHS  . escitalopram  5 mg Oral Daily  . folic acid  1 mg Oral Daily  . furosemide  20 mg Oral Daily  . insulin aspart  0-15 Units Subcutaneous TID WC  .  insulin aspart  0-5 Units Subcutaneous QHS  . levETIRAcetam  500 mg Oral BID  . ondansetron  4 mg Intravenous Once  . potassium chloride SA  20 mEq Oral Q breakfast  . sodium chloride  3 mL Intravenous Q12H  . warfarin  5 mg Oral ONCE-1800  . warfarin  7.5 mg Oral Once  . Warfarin - Pharmacist Dosing Inpatient   Does not apply q1800  . DISCONTD: hydrALAZINE  50 mg Oral Q8H  . DISCONTD: lisinopril  40 mg Oral Daily  . DISCONTD: metoprolol tartrate  25 mg Oral BID   Continuous Infusions:  PRN Meds:.acetaminophen  Assessment/Plan:   TIA (transient ischemic attack) with recent R frontal and occipital lob strokes-  ASA/coumdain, swallow eval ok, await MRI, PT eval completed Recent UTI but U/A clean here  ? History of seizure- continue with keppra   Dementia- ? Baseline, lives with son who works during the day   DM type 2 (diabetes mellitus, type 2)- SSI plus lantus (start at a lower dose)- patient is eating well   HTN (hypertension)- currently hypotensive, holding medications- watch for BP increase  HLD- statin started   Paroxysmal atrial fibrillation- sinus, on coumdain   Anticoagulant long-term use- pharmacy for coumadin goal of 2-3   Anemia- stable  Hyponatremia- corrects with BS  Family updated via phone Code status to be address when they arrive at hospital.    LOS: 1 day  Tarique Loveall, DO 05/24/2011, 11:53 AM

## 2011-05-24 NOTE — Evaluation (Signed)
Physical Therapy Evaluation Patient Details Name: Helen [name] MRN: [id number] DOB: 10-May-1924 Today's Date: 05/24/2011  Problem List:  Patient Active Problem List  Diagnoses  . TIA (transient ischemic attack)  . Dementia  . DM type 2 (diabetes mellitus, type 2)  . HTN (hypertension)  . Paroxysmal atrial fibrillation  . Anticoagulant long-term use  . Anemia    Past Medical History:  Past Medical History  Diagnosis Date  . Stroke   . Atrial fibrillation   . Diabetes mellitus   . Hypertension   . Coronary artery disease    Past Surgical History:  Past Surgical History  Procedure Date  . Appendectomy   . Abdominal hysterectomy     PT Assessment/Plan/Recommendation PT Assessment Clinical Impression Statement: Pt with B LE weakness and decreased activity tolerance who will benefit from acute skilled therapy and SNF for short period of stay. PT Recommendation/Assessment: Patient will need skilled PT in the acute care venue PT Problem List: Decreased strength;Decreased activity tolerance;Decreased balance PT Therapy Diagnosis : Difficulty walking;Generalized weakness PT Plan PT Frequency: Min 3X/week PT Treatment/Interventions: Gait training;Therapeutic activities;Therapeutic exercise PT Recommendation Follow Up Recommendations: Skilled nursing facility Equipment Recommended: Defer to next venue PT Goals  Acute Rehab PT Goals PT Goal Formulation: With patient Time For Goal Achievement: 7 days Pt will go Sit to Stand: Independently PT Goal: Sit to Stand - Progress: Goal set today Pt will go Stand to Sit: Independently PT Goal: Stand to Sit - Progress: Goal set today Pt will Transfer Bed to Chair/Chair to Bed: with modified independence PT Transfer Goal: Bed to Chair/Chair to Bed - Progress: Goal set today Pt will Ambulate: 51 - 150 feet;with modified independence;with rolling walker PT Goal: Ambulate - Progress: Goal set today Pt will Go Up / Down Stairs: 3-5  stairs;with min assist PT Goal: Up/Down Stairs - Progress: Goal set today  PT Evaluation Precautions/Restrictions  Precautions Precautions: Fall Restrictions Weight Bearing Restrictions: No Prior Functioning  Home Living Lives With: Son Available Help at Discharge: Family Type of Home: House Home Access: Stairs to enter Secretary/administrator of Steps: 5 Entrance Stairs-Rails: Right Home Layout: One level Firefighter: Standard Home Adaptive Equipment: Walker - rolling Prior Function Level of Independence: Independent with assistive device(s) Able to Take Stairs?: Yes Driving: No Vocation: Retired Producer, television/film/video: Awake/alert Overall Cognitive Status: Appears within functional limits for tasks assessed Orientation Level: Oriented X4 Sensation/Coordination Sensation Light Touch: Appears Intact Coordination Gross Motor Movements are Fluid and Coordinated: Yes Fine Motor Movements are Fluid and Coordinated: Not tested Extremity Assessment RUE Assessment RUE Assessment: Not tested LUE Assessment LUE Assessment: Not tested RLE Assessment RLE Assessment: Exceptions to [facility] RLE Strength RLE Overall Strength: Deficits (3/5) LLE Assessment LLE Assessment: Exceptions to [facility] LLE Strength LLE Overall Strength: Deficits (3/5) LLE Overall Strength Comments: LE are weak B   Mobility (including Balance) Bed Mobility Bed Mobility: Yes Rolling Right: 7: Independent Rolling Left: 7: Independent Sitting - Scoot to Edge of Bed: 6: Modified independent (Device/Increase time) Transfers Transfers: Yes Sit to Stand: 6: Modified independent (Device/Increase time) Stand to Sit: 7: Independent Stand Pivot Transfers: 5: Supervision Ambulation/Gait Ambulation/Gait: Yes Ambulation/Gait Assistance: 5: Supervision Ambulation Distance (Feet): 60 Feet Assistive device: Rolling walker Gait Pattern: Trunk flexed Gait velocity: slow Stairs: No  Balance Balance  Assessed: No Exercise    End of Session PT - End of Session Equipment Utilized During Treatment: Gait belt Activity Tolerance: Patient tolerated treatment well Patient left: in chair;with family/visitor present Nurse Communication:  Mobility status for transfers;Mobility status for ambulation General Behavior During Session: [facility] for tasks performed Cognition: [facility] for tasks performed  Lash Matulich,CINDY 05/24/2011, 9:37 AM

## 2011-05-25 ENCOUNTER — Encounter (HOSPITAL_COMMUNITY): Payer: Self-pay | Admitting: Internal Medicine

## 2011-05-25 DIAGNOSIS — I639 Cerebral infarction, unspecified: Secondary | ICD-10-CM | POA: Diagnosis present

## 2011-05-25 DIAGNOSIS — E785 Hyperlipidemia, unspecified: Secondary | ICD-10-CM

## 2011-05-25 DIAGNOSIS — E871 Hypo-osmolality and hyponatremia: Secondary | ICD-10-CM | POA: Diagnosis present

## 2011-05-25 DIAGNOSIS — D649 Anemia, unspecified: Secondary | ICD-10-CM

## 2011-05-25 HISTORY — DX: Anemia, unspecified: D64.9

## 2011-05-25 HISTORY — DX: Hyperlipidemia, unspecified: E78.5

## 2011-05-25 LAB — BASIC METABOLIC PANEL
BUN: 19 mg/dL (ref 6–23)
Calcium: 9.2 mg/dL (ref 8.4–10.5)
Chloride: 104 mEq/L (ref 96–112)
Creatinine, Ser: 1.08 mg/dL (ref 0.50–1.10)
GFR calc Af Amer: 53 mL/min — ABNORMAL LOW (ref >90)
GFR calc non Af Amer: 46 mL/min — ABNORMAL LOW (ref >90)

## 2011-05-25 LAB — CBC
MCHC: 32.7 g/dL (ref 30.0–36.0)
Platelets: 192 10*3/uL (ref 150–400)
RDW: 14.6 % (ref 11.5–15.5)
WBC: 5.4 10*3/uL (ref 4.0–10.5)

## 2011-05-25 LAB — GLUCOSE, CAPILLARY
Glucose-Capillary: 98 mg/dL (ref 70–99)
Glucose-Capillary: 109 mg/dL — ABNORMAL HIGH (ref 70–99)

## 2011-05-25 LAB — PROTIME-INR: INR: 1.74 — ABNORMAL HIGH (ref 0.00–1.49)

## 2011-05-25 MED ORDER — PANTOPRAZOLE SODIUM 40 MG PO TBEC: 40.0000 mg | DELAYED_RELEASE_TABLET | Freq: Every day | ORAL | Status: DC

## 2011-05-25 MED ORDER — WARFARIN SODIUM 7.5 MG PO TABS
7.5000 mg | ORAL_TABLET | Freq: Once | ORAL | Status: AC
  Administered 2011-05-25: 7.5 mg via ORAL
  Filled 2011-05-25: qty 1

## 2011-05-25 MED ORDER — PANTOPRAZOLE SODIUM 40 MG PO TBEC
40.0000 mg | DELAYED_RELEASE_TABLET | Freq: Every day | ORAL | Status: DC
  Administered 2011-05-26: 40 mg via ORAL
  Filled 2011-05-25 (×2): qty 1

## 2011-05-25 NOTE — Clinical Social Work Psychosocial (Signed)
Clinical Social Work Department BRIEF PSYCHOSOCIAL ASSESSMENT 05/25/2011  Patient:  Helen [name],Helen [name]     Account Number:  [id number]     Admit date:  05/23/2011  Clinical Social Worker:  Andres Shad  Date/Time:  05/25/2011 02:41 PM  Referred by:  Physician  Date Referred:  05/25/2011 Referred for  SNF Placement   Other Referral:   Family reporting they cannot care for patient at home, thus needs placement   Interview type:  Family Other interview type:   Physican report  Care managment  Chart Review  Patient unable to participate at this time due to confusion    PSYCHOSOCIAL DATA Living Status:  FAMILY Admitted from facility:   Level of care:   Primary support name:  Ronnie Primary support relationship to patient:  CHILD, ADULT Degree of support available:   Very supportive, lives with patient    CURRENT CONCERNS Current Concerns  Post-Acute Placement   Other Concerns:    SOCIAL WORK ASSESSMENT / PLAN Received referral from physician.  Reports due to medical needs and level of care, family at this time cannot care of patient in the home, thus are seeking placement in [facility].  With permission, referred patient to SNF locally and will follow up with bed offers   Assessment/plan status:  Psychosocial Support/Ongoing Assessment of Needs Other assessment/ plan:   none at this time   Information/referral to community resources:   Referral for SNF    PATIENT'S/FAMILY'S RESPONSE TO PLAN OF CARE: All agreeable to SNF placement at time of DC.  Choices being [facility] and [facility].    Ashley Jacobs, MSW LCSW [phone]

## 2011-05-25 NOTE — Progress Notes (Signed)
Occupational Therapy Treatment Patient Details Name: Helen [name] MRN: [id number] DOB: Feb 03, 1925 Today's Date: 05/25/2011  OT Assessment/Plan OT Assessment/Plan OT Frequency: Min 2X/week OT Goals Miscellaneous OT Goals Miscellaneous OT Goal #1: Patient will improve GMC in BUE from fair to good for increased ability to complete BADLs independently and safely. OT Goal: Miscellaneous Goal #1 - Progress: Progressing toward goals  OT Treatment Precautions/Restrictions  Precautions Precautions: Fall Restrictions Weight Bearing Restrictions: No Coordination Gross Motor Movements are Fluid and Coordinated: No Fine Motor Movements are Fluid and Coordinated: No Coordination and Movement Description: Patient had difficulty grasping covers and pulling them back when in bed. ADL ADL Grooming: Performed;Brushing hair;Teeth care;Wash/dry hands;Set up;Supervision/safety Where Assessed - Grooming: Standing at sink Toilet Transfer: Modified independent Toilet Transfer Method: Stand pivot Acupuncturist: Doctor, general practice - Hygiene: Minimal assistance Where Assessed - Toileting Hygiene: Standing Mobility  Bed Mobility Rolling Right: 7: Independent Rolling Left: 7: Independent Right Sidelying to Sit: 7: Independent Sitting - Scoot to Edge of Bed: 7: Independent Transfers Sit to Stand: 5: Supervision Stand to Sit: 4: Min assist Exercises    End of Session OT - End of Session Equipment Utilized During Treatment: Gait belt Activity Tolerance: Patient limited by fatigue Patient left: in chair;with call bell in reach;with family/visitor present Nurse Communication: Other (comment) (patient up in chair) General Behavior During Session: Lethargic Cognition: WFL for tasks performed  Helen [name], COTA/L  05/25/2011, 4:33 PM

## 2011-05-25 NOTE — Procedures (Signed)
[name], [name]                ACCOUNT NO.:  [id number]  MEDICAL RECORD NO.:  [id number]  LOCATION:  A214                          FACILITY:  [facility]  PHYSICIAN:  Sinclair Alligood A. Gerilyn Pilgrim, M.D. DATE OF BIRTH:  [birth year]  DATE OF PROCEDURE:  05/24/2011 DATE OF DISCHARGE:                             EEG INTERPRETATION   INDICATION:  This is a lady who presents with a spell suspicious for seizure activity.  She is 78 year old.  MEDICATIONS:  Tylenol, aspirin, donepezil, Lexapro, Lipitor, Lasix, folic acid, insulin, Coumadin.  ANALYSIS:  A 16-channel recording is conducted for approximately 20 minutes.  There is a well-formed posterior dominant rhythm of 8 Hz with attenuates with eye opening.  Throughout most of the recording, sleep activities are observed.  Both K complexes, sleep spindles, and slow wave sleep are seen.  There is no focal or lateralized slowing.  Photic stimulation does not result any significant changes in the background activity.  IMPRESSION:  Normal recording for age.  There is no epileptiform activity observed.     Macgregor Aeschliman A. Gerilyn Pilgrim, M.D.     KAD/MEDQ  D:  05/25/2011  T:  05/25/2011  Job:  [id number]

## 2011-05-25 NOTE — Clinical Social Work Placement (Signed)
Clinical Social Work Department CLINICAL SOCIAL WORK PLACEMENT NOTE 05/25/2011  Patient:  Helen [name],Helen [name]  Account Number:  [id number] Admit date:  05/23/2011  Clinical Social Worker:  Ashley Jacobs, LCSW  Date/time:  05/25/2011 02:45 PM  Clinical Social Work is seeking post-discharge placement for this patient at the following level of care:   SKILLED NURSING   (*CSW will update this form in Epic as items are completed)   05/25/2011  Patient/family provided with [facility] Department of Clinical Social Work's list of facilities offering this level of care within the geographic area requested by the patient (or if unable, by the patient's family).  05/25/2011  Patient/family informed of their freedom to choose among providers that offer the needed level of care, that participate in Medicare, Medicaid or managed care program needed by the patient, have an available bed and are willing to accept the patient.  05/25/2011  Patient/family informed of [facility]' ownership interest in [facility], as well as of the fact that they are under no obligation to receive care at this facility.  PASARR submitted to [employer] on 05/25/2011 PASARR number received from [employer] on   FL2 transmitted to all facilities in geographic area requested by pt/family on  05/25/2011 FL2 transmitted to all facilities within larger geographic area on   Patient informed that his/her managed care company has contracts with or will negotiate with  certain facilities, including the following:     Patient/family informed of bed offers received:   Patient chooses bed at  Physician recommends and patient chooses bed at    Patient to be transferred to  on   Patient to be transferred to facility by   The following physician request were entered in Epic:   Additional Comments:  Ashley Jacobs, MSW LCSW [phone]

## 2011-05-25 NOTE — Progress Notes (Signed)
Physical Therapy Treatment Patient Details Name: Helen [name] MRN: [id number] DOB: Jul 17, 1924 Today's Date: 05/25/2011  PT Assessment/Plan  Patient able to complete bed and EOB exercises without fatiguing and good strength,however during gait and sit<>stands patient became fatigued easily. Stairs not appropriate today will attempt tomorrow PT Goals     PT Treatment Precautions/Restrictions  Precautions Precautions: Fall Restrictions Weight Bearing Restrictions: No Mobility (including Balance) Bed Mobility Right Sidelying to Sit: 6: Modified independent (Device/Increase time) Sitting - Scoot to Edge of Bed: 6: Modified independent (Device/Increase time) Transfers Transfers: Yes Sit to Stand: 6: Modified independent (Device/Increase time) Stand to Sit: 5: Supervision Stand to Sit Details: verbal cues to reach back before sitting Ambulation/Gait Ambulation/Gait: Yes Ambulation/Gait Assistance: 5: Supervision Ambulation/Gait Assistance Details (indicate cue type and reason): verbal cues to keep RW closer Ambulation Distance (Feet): 30 Feet (patient became fatigued) Assistive device: Rolling walker Gait Pattern: Trunk flexed;Decreased stance time - right;Decreased step length - left Gait velocity: slow Stairs: No Wheelchair Mobility Wheelchair Mobility: No    Exercise  General Exercises - Lower Extremity Ankle Circles/Pumps: Both;15 reps Quad Sets: Both;10 reps Gluteal Sets: 10 reps Long Arc Quad: Both;10 reps Heel Slides: 10 reps;Both Hip ABduction/ADduction: Both;10 reps Straight Leg Raises: 10 reps;Both Heel Raises: Both;10 reps Mini-Sqauts: 10 reps;Both Other Exercises Other Exercises: sit <>stand x5 End of Session    Lewis Keats ATKINSO 05/25/2011, 11:18 AM

## 2011-05-25 NOTE — Consult Note (Signed)
ANTICOAGULATION CONSULT NOTE   Pharmacy Consult for Warfarin Indication: atrial fibrillation  Allergies  Allergen Reactions  . Ciprofloxacin     Patient states that she may have had a stroke due to this medication  . Niacin And Related Hives   Patient Measurements: Height: 5\' 5"  (165.1 cm) Weight: 161 lb 9.6 oz (73.3 kg) IBW/kg (Calculated) : 57   Vital Signs: Temp: 97.8 F (36.6 C) (04/17 0554) BP: 147/73 mmHg (04/17 0554) Pulse Rate: 53  (04/17 0554)  Labs:  Basename 05/25/11 0510 05/24/11 0500 05/23/11 1955  HGB -- -- 10.7*  HCT -- -- 31.6*  PLT -- -- 222  APTT -- -- 33  LABPROT 20.7* 20.8* 19.4*  INR 1.74* 1.76* 1.61*  HEPARINUNFRC -- -- --  CREATININE -- -- 1.05  CKTOTAL -- -- 74  CKMB -- -- 1.8  TROPONINI -- -- <0.30   Estimated Creatinine Clearance: 40 ml/min (by C-G formula based on Cr of 1.05).  Medical History: Past Medical History  Diagnosis Date  . Stroke   . Atrial fibrillation   . Diabetes mellitus   . Hypertension   . Coronary artery disease    Medications:  Prescriptions prior to admission  Medication Sig Dispense Refill  . aspirin EC 81 MG tablet Take 81 mg by mouth every morning. At 700      . B Complex-C (SUPER B COMPLEX) TABS Take 1 tablet by mouth every morning. At 700      . calcium carbonate (OS-CAL) 600 MG TABS Take 600 mg by mouth 2 (two) times daily with a meal.      . donepezil (ARICEPT) 10 MG tablet Take 10 mg by mouth.      . escitalopram (LEXAPRO) 5 MG tablet Take 5 mg by mouth every morning.      . folic acid (FOLVITE) 1 MG tablet Take 1 mg by mouth every morning. At 700      . furosemide (LASIX) 20 MG tablet Take 20 mg by mouth every morning. At 700      . hydrALAZINE (APRESOLINE) 50 MG tablet Take 50 mg by mouth every 8 (eight) hours.      . insulin glargine (LANTUS SOLOSTAR) 100 UNIT/ML injection Inject 20 Units into the skin at bedtime as needed. **If blood sugars are less than 200--DO NOT GIVE INJECTION**      .  levETIRAcetam (KEPPRA) 500 MG tablet Take 500 mg by mouth 2 (two) times daily.      Marland Kitchen lisinopril (PRINIVIL,ZESTRIL) 40 MG tablet Take 40 mg by mouth every morning. At 700      . metoprolol tartrate (LOPRESSOR) 25 MG tablet Take 25 mg by mouth 2 (two) times daily.      . Multiple Vitamins-Minerals (CENTRUM SILVER ULTRA WOMENS PO) Take 1 tablet by mouth every morning. At 700      . oxybutynin (DITROPAN-XL) 10 MG 24 hr tablet Take 10 mg by mouth every morning. At 700      . potassium chloride SA (K-DUR,KLOR-CON) 20 MEQ tablet Take 20 mEq by mouth every morning. At 700      . sulfamethoxazole-trimethoprim (BACTRIM DS,SEPTRA DS) 800-160 MG per tablet Take 1 tablet by mouth 2 (two) times daily.      Marland Kitchen warfarin (COUMADIN) 2.5 MG tablet Take 2.5 mg by mouth daily. Take one tablet in addition to Coumadin 4mg  on Thursdays and Saturdays      . warfarin (COUMADIN) 3 MG tablet Take 3 mg by mouth See admin instructions. Take  one tablet in addition to Coumadin 4mg  for  A total of 7mg  on Mondays, Tuesdays, Wednesdays, Fridays, and Sundays of each week.      . warfarin (COUMADIN) 4 MG tablet Take 4 mg by mouth See admin instructions. Take one tablet by mouth in addition to Coumadin 2.5mg  for a total of 6.5 mg on Thursdays and Saturdays.      Marland Kitchen zolpidem (AMBIEN) 5 MG tablet Take 5 mg by mouth at bedtime.       Assessment: INR below target Complicated home dosing regimen  Goal of Therapy:  INR 2-3   Plan: Coumadin 7.5mg  today x 1 INR daily Simplify regimen if possible  Margo Aye, Carlissa Pesola A 05/25/2011,10:25 AM

## 2011-05-25 NOTE — Progress Notes (Signed)
Subjective: The patient has no complaints of headaches, difficulty speaking, or difficulty swallowing. She acknowledges some weakness of her left hand but says that it is no worse than usual.  Objective: Vital signs in last 24 hours: Filed Vitals:   05/25/11 0554 05/25/11 0830 05/25/11 1500 05/25/11 1600  BP: 147/73  125/65 112/67  Pulse: 53  60 62  Temp: 97.8 F (36.6 C)  98.3 F (36.8 C) 97.8 F (36.6 C)  TempSrc:   Oral Oral  Resp: 20  20 20   Height:      Weight:      SpO2: 100% 97% 99% 98%    Intake/Output Summary (Last 24 hours) at 05/25/11 1750 Last data filed at 05/25/11 1730  Gross per 24 hour  Intake   1080 ml  Output   2575 ml  Net  -1495 ml    Weight change:   Physical exam: Lungs: Clear to auscultation bilaterally. Heart: S1, S2, with a soft systolic murmur. Abdomen: Positive bowel sounds, soft, nontender, nondistended. Extremities: No pedal edema. Neurologic: There is a mild left-sided facial droop. No dysarthria or aphasia. Handgrip is 5 over 5 symmetric. Sensation is grossly intact.  Lab Results: Basic Metabolic Panel:  Basename 05/25/11 0510 05/23/11 1955  NA 136 134*  K 4.2 4.0  CL 104 101  CO2 24 19  GLUCOSE 99 181*  BUN 19 19  CREATININE 1.08 1.05  CALCIUM 9.2 9.6  MG -- --  PHOS -- --   Liver Function Tests:  Basename 05/23/11 1955  AST 18  ALT 13  ALKPHOS 85  BILITOT 0.2*  PROT 7.1  ALBUMIN 3.6   No results found for this basename: LIPASE:2,AMYLASE:2 in the last 72 hours No results found for this basename: AMMONIA:2 in the last 72 hours CBC:  Basename 05/25/11 0510 05/23/11 1955  WBC 5.4 7.3  NEUTROABS -- 5.2  HGB 9.9* 10.7*  HCT 30.3* 31.6*  MCV 88.1 84.7  PLT 192 222   Cardiac Enzymes:  Basename 05/23/11 1955  CKTOTAL 74  CKMB 1.8  CKMBINDEX --  TROPONINI <0.30   BNP: No results found for this basename: PROBNP:3 in the last 72 hours D-Dimer: No results found for this basename: DDIMER:2 in the last 72  hours CBG:  Basename 05/25/11 1151 05/25/11 0807 05/24/11 2105 05/24/11 1715 05/24/11 1122 05/24/11 0758  GLUCAP 109* 98 149* 120* 137* 119*   Hemoglobin A1C:  Basename 05/24/11 0037  HGBA1C 6.5*   Fasting Lipid Panel:  Basename 05/24/11 0500  CHOL 223*  HDL 44  LDLCALC 148*  TRIG 156*  CHOLHDL 5.1  LDLDIRECT --   Thyroid Function Tests: No results found for this basename: TSH,T4TOTAL,FREET4,T3FREE,THYROIDAB in the last 72 hours Anemia Panel:  Basename 05/24/11 0500  VITAMINB12 388  FOLATE >20.0  FERRITIN 15  TIBC 307  IRON 88  RETICCTPCT 1.2   Coagulation:  Basename 05/25/11 0510 05/24/11 0500  LABPROT 20.7* 20.8*  INR 1.74* 1.76*   Urine Drug Screen: Drugs of Abuse     Component Value Date/Time   LABOPIA NONE DETECTED 05/23/2011 1924   COCAINSCRNUR NONE DETECTED 05/23/2011 1924   LABBENZ NONE DETECTED 05/23/2011 1924   AMPHETMU NONE DETECTED 05/23/2011 1924   THCU NONE DETECTED 05/23/2011 1924   LABBARB NONE DETECTED 05/23/2011 1924    Alcohol Level: No results found for this basename: ETH:2 in the last 72 hours Urinalysis:  Basename 05/23/11 1924  COLORURINE YELLOW  LABSPEC 1.010  PHURINE 7.0  GLUCOSEU NEGATIVE  HGBUR NEGATIVE  BILIRUBINUR NEGATIVE  KETONESUR NEGATIVE  PROTEINUR NEGATIVE  UROBILINOGEN 0.2  NITRITE NEGATIVE  LEUKOCYTESUR NEGATIVE   Misc. Labs:   Micro: No results found for this or any previous visit (from the past 240 hour(s)).  Studies/Results: Ct Head Wo Contrast  05/23/2011  *RADIOLOGY REPORT*  Clinical Data: Right hand numbness.  Difficulty speaking. Confusion.  CT HEAD WITHOUT CONTRAST  Technique:  Contiguous axial images were obtained from the base of the skull through the vertex without contrast.  Comparison: None.  Findings: There is cortical atrophy with extensive hypoattenuation in the subcortical and periventricular deep white matter.  No evidence of acute infarction, hemorrhage, mass lesion, mass effect, midline shift  or abnormal extra-axial fluid collection.  No hydrocephalus or pneumocephalus.  The calvarium is intact.  IMPRESSION: No acute finding.  Atrophy and chronic microvascular ischemic change.  Original Report Authenticated By: Bernadene Bell. Maricela Curet, M.D.   Mr Maxine Glenn Head Wo Contrast  05/24/2011  *RADIOLOGY REPORT*  Clinical Data:  78 year old female with right hand numbness, confusion, difficulty speaking.  Comparison: Head CT 05/23/2011.  MRI HEAD WITHOUT CONTRAST  Technique: Multiplanar, multiecho pulse sequences of the brain and surrounding structures were obtained according to standard protocol without intravenous contrast.  Findings: Subtle cortical restricted diffusion in the right superior frontal gyrus pre motor area (series 3 image 19, 18). Associated T2 and FLAIR hyperintensity, which is then inseparable from confluent cerebral white matter signal hyperintensity, also present in the left hemisphere.  No mass effect or definite hemorrhage at this site.  Diffusion elsewhere is within normal limits. Major intracranial vascular flow voids are preserved.  No ventriculomegaly. No midline shift, mass effect, or evidence of mass lesion.  Negative pituitary.  Negative cervicomedullary junction and visualized cervical spine except for bulky degenerative ligamentous hypertrophy about the odontoid.  Widespread cerebral white matter signal abnormality as previously described.  Additional widespread pontine white matter signal hyperintensity.  Moderate for age deep gray matter nuclei T2 heterogeneity.  The cerebellum remains within normal limits.  Visualized bone marrow signal is within normal limits.  Visualized orbit soft tissues are within normal limits.  Visualized paranasal sinuses and mastoids are clear.  Negative scalp soft tissues.  IMPRESSION: 1.  Small cortical acute to subacute infarct in the right frontal lobe premotor area.  No mass effect or definite hemorrhage. 2.  Underlying advanced white matter disease,  nonspecific but most commonly due to small vessel disease. 3.  MRA findings are below.  MRA HEAD WITHOUT CONTRAST  Technique: Angiographic images of the Circle of Willis were obtained using MRA technique without  intravenous contrast.  Findings: Study is intermittently degraded by motion artifact despite repeated imaging attempts.  Antegrade flow in codominant and mildly ectatic distal vertebral arteries.  Patent vertebrobasilar junction.  Normal PICA vessels. No basilar artery stenosis.  Superior cerebellar artery and PCA origins are within normal limits.  Posterior communicating arteries are diminutive or absent.  Bilateral PCA branches are within normal limits.  Antegrade flow in both ICA siphons.  Tortuous distal cervical left ICA.  No ICA siphon stenosis.  Patent carotid termini.  Dominant left ACA A1 segment.  Normal left ACA and bilateral MCA origins. Normal anterior communicating artery.  Grossly normal visualized ACA branches.  Bilateral MCA M1 branches are patent without stenosis.  Grossly normal visualized bilateral MCA branches.  IMPRESSION: Degraded by motion despite repeated imaging attempts. No intracranial stenosis or major circle of Willis branch occlusion identified.  Original Report Authenticated By: Harley Hallmark, M.D.  Mri Brain Without Contrast  05/24/2011  *RADIOLOGY REPORT*  Clinical Data:  78 year old female with right hand numbness, confusion, difficulty speaking.  Comparison: Head CT 05/23/2011.  MRI HEAD WITHOUT CONTRAST  Technique: Multiplanar, multiecho pulse sequences of the brain and surrounding structures were obtained according to standard protocol without intravenous contrast.  Findings: Subtle cortical restricted diffusion in the right superior frontal gyrus pre motor area (series 3 image 19, 18). Associated T2 and FLAIR hyperintensity, which is then inseparable from confluent cerebral white matter signal hyperintensity, also present in the left hemisphere.  No mass effect or  definite hemorrhage at this site.  Diffusion elsewhere is within normal limits. Major intracranial vascular flow voids are preserved.  No ventriculomegaly. No midline shift, mass effect, or evidence of mass lesion.  Negative pituitary.  Negative cervicomedullary junction and visualized cervical spine except for bulky degenerative ligamentous hypertrophy about the odontoid.  Widespread cerebral white matter signal abnormality as previously described.  Additional widespread pontine white matter signal hyperintensity.  Moderate for age deep gray matter nuclei T2 heterogeneity.  The cerebellum remains within normal limits.  Visualized bone marrow signal is within normal limits.  Visualized orbit soft tissues are within normal limits.  Visualized paranasal sinuses and mastoids are clear.  Negative scalp soft tissues.  IMPRESSION: 1.  Small cortical acute to subacute infarct in the right frontal lobe premotor area.  No mass effect or definite hemorrhage. 2.  Underlying advanced white matter disease, nonspecific but most commonly due to small vessel disease. 3.  MRA findings are below.  MRA HEAD WITHOUT CONTRAST  Technique: Angiographic images of the Circle of Willis were obtained using MRA technique without  intravenous contrast.  Findings: Study is intermittently degraded by motion artifact despite repeated imaging attempts.  Antegrade flow in codominant and mildly ectatic distal vertebral arteries.  Patent vertebrobasilar junction.  Normal PICA vessels. No basilar artery stenosis.  Superior cerebellar artery and PCA origins are within normal limits.  Posterior communicating arteries are diminutive or absent.  Bilateral PCA branches are within normal limits.  Antegrade flow in both ICA siphons.  Tortuous distal cervical left ICA.  No ICA siphon stenosis.  Patent carotid termini.  Dominant left ACA A1 segment.  Normal left ACA and bilateral MCA origins. Normal anterior communicating artery.  Grossly normal visualized ACA  branches.  Bilateral MCA M1 branches are patent without stenosis.  Grossly normal visualized bilateral MCA branches.  IMPRESSION: Degraded by motion despite repeated imaging attempts. No intracranial stenosis or major circle of Willis branch occlusion identified.  Original Report Authenticated By: Harley Hallmark, M.D.   US Carotid Duplex Bilateral  05/24/2011  *RADIOLOGY REPORT*  Clinical Data: TIA, history of hypertension and diabetes, now with left-sided weakness  BILATERAL CAROTID DUPLEX ULTRASOUND  Technique: Wallace Cullens scale imaging, color Doppler and duplex ultrasound was performed of bilateral carotid and vertebral arteries in the neck.  Comparison:  Head CT - 05/23/2011; Brain MRI - earlier same day  Criteria:  Quantification of carotid stenosis is based on velocity parameters that correlate the residual internal carotid diameter with NASCET-based stenosis levels, using the diameter of the distal internal carotid lumen as the denominator for stenosis measurement.  The following velocity measurements were obtained:                   PEAK SYSTOLIC/END DIASTOLIC RIGHT ICA:                        104/20cm/sec CCA:  92/13cm/sec SYSTOLIC ICA/CCA RATIO:     1.14 DIASTOLIC ICA/CCA RATIO:    1.55 ECA:                        87cm/sec  LEFT ICA:                        81/14cm/sec CCA:                        104/17cm/sec SYSTOLIC ICA/CCA RATIO:     0.87 DIASTOLIC ICA/CCA RATIO:    1.49 ECA:                        70cm/sec  Findings:  RIGHT CAROTID ARTERY: There is a minimal amount of eccentric echogenic plaque within the right carotid bulb extending to involve the origin of the right internal carotid artery which does not result in elevated peak systolic velocities to suggest hemodynamic significance.  RIGHT VERTEBRAL ARTERY:  Antegrade flow  LEFT CAROTID ARTERY: There is a minimal amount of eccentric echogenic plaque within the carotid bulb extending to involve the origin of the left internal carotid  artery, not resulting in elevated peak systolic velocities to suggest hemodynamic significance.  LEFT VERTEBRAL ARTERY:  Antegrade flow.  IMPRESSION: Minimal amount of echogenic atherosclerotic plaque within the bilateral carotid bulbs extending to the origin of the bilateral internal carotid arteries, not resulting in hemodynamically significant stenosis.  Original Report Authenticated By: Waynard Reeds, M.D.    Echo: Study Conclusions  - Left ventricle: The cavity size was normal. There was mild asymmetric hypertrophy of the septum. Systolic function was normal. The estimated ejection fraction was in the range of 55% to 60%. Wall motion was normal; there were no regional wall motion abnormalities. - Aortic valve: Mildly calcified annulus. Trileaflet; mildly thickened leaflets. Mild regurgitation. - Left atrium: The atrium was mildly dilated. - Atrial septum: No defect or patent foramen ovale was identified. - Pulmonary arteries: PA peak pressure: 33mm Hg (S). Transthoracic echocardiography. M-mode, complete 2D, spectral Doppler, and color Doppler. Height: Height: 165.1cm. Height: 65in. Weight: Weight: 73kg. Weight: 160.7lb. Body mass index: BMI: 26.8kg/m^2. Body surface area: BSA: 1.87m^2. Patient status: Inpatient. Location: Bedside.     Medications: I have reviewed the patient's current medications.  Assessment: Active Problems:  Dementia  DM type 2 (diabetes mellitus, type 2)  HTN (hypertension)  Paroxysmal atrial fibrillation  Anticoagulant long-term use  Acute embolic stroke  Hyperlipidemia  Anemia  Hyponatremia   1. Small cortical acute to subacute infarct/stroke per MRI of the brain. This is in the setting of a recent right frontal stroke a few months ago. This may be a new stroke or the previous stroke. She has no significant dysarthria, dysphagia, or unilateral weakness currently. She may have had a TIA yesterday. EEG negative for epileptiform activity. She does  have somewhat of an unsteady gait and would benefit from short-term skilled nursing facility for rehabilitation again. The 2-D echocardiogram reveals preserved LV function and no significant valvular abnormalities. Carotid ultrasound reveals no ICA stenosis.  Chronic paroxysmal atrial fibrillation. Her rate is controlled. Her INR is still subtherapeutic but better. We'll continue adjustments in warfarin therapy per pharmacy.  Hyponatremia, resolved with gentle IV fluids.  Anemia. Likely in part dilutional from IV fluids, but cannot rule out subtle GI bleeding. Iron studies noted to be normal While she is on Coumadin, we'll  add Protonix and stop aspirin.  Hyperlipidemia. A statin has been started.  Type 2 diabetes mellitus. Currently stable.  Hypertension. Currently stable off of antihypertensive medications.    Plan:  1. Add Protonix prophylactically. 2. Continue aspirin because of the worsening anemia. 3. Continue Coumadin dosing per pharmacy. 4. Skilled nursing facility placement requested as discussed with her son. The patient is in agreement.    LOS: 2 days   Melvin Marmo 05/25/2011, 5:50 PM

## 2011-05-26 ENCOUNTER — Inpatient Hospital Stay
Admission: RE | Admit: 2011-05-26 | Discharge: 2011-07-06 | Disposition: A | Discharge status: Home / Self Care | Payer: Self-pay | Source: Ambulatory Visit | Attending: Internal Medicine | Admitting: Internal Medicine

## 2011-05-26 DIAGNOSIS — M25569 Pain in unspecified knee: Secondary | ICD-10-CM

## 2011-05-26 DIAGNOSIS — R52 Pain, unspecified: Secondary | ICD-10-CM

## 2011-05-26 DIAGNOSIS — M79606 Pain in leg, unspecified: Secondary | ICD-10-CM

## 2011-05-26 DIAGNOSIS — T148XXA Other injury of unspecified body region, initial encounter: Principal | ICD-10-CM

## 2011-05-26 LAB — BASIC METABOLIC PANEL
BUN: 19 mg/dL (ref 6–23)
CO2: 23 mEq/L (ref 19–32)
Calcium: 9.3 mg/dL (ref 8.4–10.5)
GFR calc non Af Amer: 55 mL/min — ABNORMAL LOW (ref >90)
Glucose, Bld: 103 mg/dL — ABNORMAL HIGH (ref 70–99)

## 2011-05-26 LAB — CBC
HCT: 31 % — ABNORMAL LOW (ref 36.0–46.0)
MCHC: 33.2 g/dL (ref 30.0–36.0)
MCV: 86.6 fL (ref 78.0–100.0)
Platelets: 201 10*3/uL (ref 150–400)
RDW: 14.2 % (ref 11.5–15.5)
WBC: 5.3 10*3/uL (ref 4.0–10.5)

## 2011-05-26 LAB — GLUCOSE, CAPILLARY: Glucose-Capillary: 112 mg/dL — ABNORMAL HIGH (ref 70–99)

## 2011-05-26 MED ORDER — ENOXAPARIN SODIUM 120 MG/0.8ML ~~LOC~~ SOLN: 120.0000 mg | SUBCUTANEOUS | Status: DC

## 2011-05-26 MED ORDER — FERROUS SULFATE 325 (65 FE) MG PO TABS: 325.0000 mg | ORAL_TABLET | Freq: Every day | ORAL | Status: DC

## 2011-05-26 MED ORDER — ENOXAPARIN SODIUM 120 MG/0.8ML ~~LOC~~ SOLN
120.0000 mg | SUBCUTANEOUS | Status: DC
  Administered 2011-05-26: 120 mg via SUBCUTANEOUS
  Filled 2011-05-26 (×2): qty 0.8

## 2011-05-26 MED ORDER — PANTOPRAZOLE SODIUM 40 MG PO TBEC: 40.0000 mg | DELAYED_RELEASE_TABLET | Freq: Every day | ORAL | Status: DC

## 2011-05-26 MED ORDER — WARFARIN SODIUM 7.5 MG PO TABS: 7.5000 mg | ORAL_TABLET | Freq: Every day | ORAL | Status: DC

## 2011-05-26 MED ORDER — WARFARIN SODIUM 7.5 MG PO TABS: 7.5000 mg | ORAL_TABLET | Freq: Once | ORAL | Status: DC

## 2011-05-26 MED ORDER — FLEET ENEMA 7-19 GM/118ML RE ENEM: 1.0000 | ENEMA | Freq: Once | RECTAL | Status: DC

## 2011-05-26 MED ORDER — METOPROLOL TARTRATE 12.5 MG HALF TABLET: 12.5000 mg | ORAL_TABLET | Freq: Two times a day (BID) | ORAL | Status: DC

## 2011-05-26 MED ORDER — LISINOPRIL 10 MG PO TABS: 10.0000 mg | ORAL_TABLET | ORAL | Status: DC

## 2011-05-26 MED ORDER — ATORVASTATIN CALCIUM 10 MG PO TABS: 10.0000 mg | ORAL_TABLET | Freq: Every day | ORAL | Status: DC

## 2011-05-26 NOTE — Progress Notes (Signed)
Clinical Social Work:    Following for patient to be placed in SNF.  [facility] has a bed they can offer and also take patient today if medically stable.  Will discuss with MD with regards to patient medical status.  Will speak with son Christen Bame about the bed offer, with [facility] being his first choice.  Will follow.  Ashley Jacobs, MSW LCSW [phone]

## 2011-05-26 NOTE — Progress Notes (Signed)
Clinical Social Work:  Patient is medically stable for discharge with bed at [facility].  Family was contacted via phone and is agreeable to dc to SNF.  Patient will be transporting by hospital staff.  DC paperwork completed and faxed to facility.  No other needs at this time.  DC patient to SNF.  CSW signed off.  Honest Vanleer Nail, MSW LCSW

## 2011-05-26 NOTE — Progress Notes (Signed)
Discharge SUmmary: pt a/o.vss. Saline lock removed. Up with assistance. No complaints. Report called to nurse at [facility]. Nurse verbalized understanding of report. Pt to be transferred via wheelchair.

## 2011-05-26 NOTE — Progress Notes (Signed)
Pt impacted orders given to disimpact pt an give Fleets enema. Pt requested to take enema at later time.

## 2011-05-26 NOTE — Progress Notes (Signed)
Pt does not want fleets enema at this time.

## 2011-05-26 NOTE — Discharge Summary (Signed)
Physician Discharge Summary  Helen [name] MRN: [id number] DOB/AGE: 09-Feb-1924 78 y.o.  PCP: Arelia Sneddon, MD, MD   Admit date: 05/23/2011 Discharge date: 05/26/2011  Discharge Diagnoses:  1. Dysarthria or expressive aphasia, possibly secondary to a TIA. 2. Small cortical acute to subacute stroke in the right frontal lobe per MRI of the brain in the setting of recent frontal lobe strokes in January 2013. Thought to be embolic in nature. 3. Chronic paroxysmal atrial fibrillation, on Coumadin therapy. The patient's INR was subtherapeutic on admission and actually decreased during the hospitalization to 1.49 at the time of discharge. She was discharged on bridging Lovenox. 4. Mild residual left-sided hemiparesis. 5. Type 2 diabetes mellitus, insulin requiring. Her hemoglobin A1c was 6.5. 6. Hypertension with low normal blood pressures during hospitalizations. Her hypertensive medication doses have been decreased. 7. Hyperlipidemia. Statin therapy started. Her fasting lipid profile revealed a total cholesterol of 223, triglycerides of 156, HDL cholesterol 44, and LDL cholesterol 148. 8. Normocytic anemia. The patient's anemia profile revealed a total iron of 88, TIBC of 307, low-normal ferritin of 15, folate of greater than 20, and vitamin B12 of 388. 9. Chronic dementia. 10. Recent treatment of urinary tract infection.    Medication List  As of 05/26/2011  1:02 PM   STOP taking these medications         aspirin EC 81 MG tablet      hydrALAZINE 50 MG tablet      sulfamethoxazole-trimethoprim 800-160 MG per tablet      warfarin 3 MG tablet      warfarin 4 MG tablet      zolpidem 5 MG tablet         TAKE these medications         atorvastatin 10 MG tablet   Commonly known as: LIPITOR   Take 1 tablet (10 mg total) by mouth daily at 6 PM.      calcium carbonate 600 MG Tabs   Commonly known as: OS-CAL   Take 600 mg by mouth 2 (two) times daily with a meal.      CENTRUM  SILVER ULTRA WOMENS PO   Take 1 tablet by mouth every morning. At 700      donepezil 10 MG tablet   Commonly known as: ARICEPT   Take 10 mg by mouth.      enoxaparin 120 MG/0.8ML injection   Commonly known as: LOVENOX   Inject 0.8 mLs (120 mg total) into the skin daily. GIVE UNTIL INR IS AT LEAST 1.8-2.0; THEN STOP.      escitalopram 5 MG tablet   Commonly known as: LEXAPRO   Take 5 mg by mouth every morning.      ferrous sulfate 325 (65 FE) MG tablet   Take 1 tablet (325 mg total) by mouth daily with breakfast.      folic acid 1 MG tablet   Commonly known as: FOLVITE   Take 1 mg by mouth every morning. At 700      furosemide 20 MG tablet   Commonly known as: LASIX   Take 20 mg by mouth every morning. At 700      LANTUS SOLOSTAR 100 UNIT/ML injection   Generic drug: insulin glargine   Inject 20 Units into the skin at bedtime as needed. **If blood sugars are less than 200--DO NOT GIVE INJECTION**      levETIRAcetam 500 MG tablet   Commonly known as: KEPPRA   Take 500 mg by mouth 2 (  two) times daily.      lisinopril 10 MG tablet   Commonly known as: PRINIVIL,ZESTRIL   Take 1 tablet (10 mg total) by mouth every morning. At 700      metoprolol tartrate 12.5 mg Tabs   Commonly known as: LOPRESSOR   Take 0.5 tablets (12.5 mg total) by mouth 2 (two) times daily.      oxybutynin 10 MG 24 hr tablet   Commonly known as: DITROPAN-XL   Take 10 mg by mouth every morning. At 700      pantoprazole 40 MG tablet   Commonly known as: PROTONIX   Take 1 tablet (40 mg total) by mouth daily at 12 noon.      potassium chloride SA 20 MEQ tablet   Commonly known as: K-DUR,KLOR-CON   Take 20 mEq by mouth every morning. At 700      Super B Complex Tabs   Take 1 tablet by mouth every morning. At 700      warfarin 7.5 MG tablet   Commonly known as: COUMADIN   Take 1 tablet (7.5 mg total) by mouth daily.            Discharge Condition: Improved.  Disposition: Skilled nursing  facility [facility]).   Consults: None   Significant Diagnostic Studies: Ct Head Wo Contrast  05/23/2011  *RADIOLOGY REPORT*  Clinical Data: Right hand numbness.  Difficulty speaking. Confusion.  CT HEAD WITHOUT CONTRAST  Technique:  Contiguous axial images were obtained from the base of the skull through the vertex without contrast.  Comparison: None.  Findings: There is cortical atrophy with extensive hypoattenuation in the subcortical and periventricular deep white matter.  No evidence of acute infarction, hemorrhage, mass lesion, mass effect, midline shift or abnormal extra-axial fluid collection.  No hydrocephalus or pneumocephalus.  The calvarium is intact.  IMPRESSION: No acute finding.  Atrophy and chronic microvascular ischemic change.  Original Report Authenticated By: Bernadene Bell. Maricela Curet, M.D.   Mr Maxine Glenn Head Wo Contrast  05/24/2011  *RADIOLOGY REPORT*  Clinical Data:  78 year old female with right hand numbness, confusion, difficulty speaking.  Comparison: Head CT 05/23/2011.  MRI HEAD WITHOUT CONTRAST  Technique: Multiplanar, multiecho pulse sequences of the brain and surrounding structures were obtained according to standard protocol without intravenous contrast.  Findings: Subtle cortical restricted diffusion in the right superior frontal gyrus pre motor area (series 3 image 19, 18). Associated T2 and FLAIR hyperintensity, which is then inseparable from confluent cerebral white matter signal hyperintensity, also present in the left hemisphere.  No mass effect or definite hemorrhage at this site.  Diffusion elsewhere is within normal limits. Major intracranial vascular flow voids are preserved.  No ventriculomegaly. No midline shift, mass effect, or evidence of mass lesion.  Negative pituitary.  Negative cervicomedullary junction and visualized cervical spine except for bulky degenerative ligamentous hypertrophy about the odontoid.  Widespread cerebral white matter signal abnormality as  previously described.  Additional widespread pontine white matter signal hyperintensity.  Moderate for age deep gray matter nuclei T2 heterogeneity.  The cerebellum remains within normal limits.  Visualized bone marrow signal is within normal limits.  Visualized orbit soft tissues are within normal limits.  Visualized paranasal sinuses and mastoids are clear.  Negative scalp soft tissues.  IMPRESSION: 1.  Small cortical acute to subacute infarct in the right frontal lobe premotor area.  No mass effect or definite hemorrhage. 2.  Underlying advanced white matter disease, nonspecific but most commonly due to small vessel disease. 3.  MRA findings are below.  MRA HEAD WITHOUT CONTRAST  Technique: Angiographic images of the Circle of Willis were obtained using MRA technique without  intravenous contrast.  Findings: Study is intermittently degraded by motion artifact despite repeated imaging attempts.  Antegrade flow in codominant and mildly ectatic distal vertebral arteries.  Patent vertebrobasilar junction.  Normal PICA vessels. No basilar artery stenosis.  Superior cerebellar artery and PCA origins are within normal limits.  Posterior communicating arteries are diminutive or absent.  Bilateral PCA branches are within normal limits.  Antegrade flow in both ICA siphons.  Tortuous distal cervical left ICA.  No ICA siphon stenosis.  Patent carotid termini.  Dominant left ACA A1 segment.  Normal left ACA and bilateral MCA origins. Normal anterior communicating artery.  Grossly normal visualized ACA branches.  Bilateral MCA M1 branches are patent without stenosis.  Grossly normal visualized bilateral MCA branches.  IMPRESSION: Degraded by motion despite repeated imaging attempts. No intracranial stenosis or major circle of Willis branch occlusion identified.  Original Report Authenticated By: Harley Hallmark, M.D.   Mri Brain Without Contrast  05/24/2011  *RADIOLOGY REPORT*  Clinical Data:  78 year old female with right  hand numbness, confusion, difficulty speaking.  Comparison: Head CT 05/23/2011.  MRI HEAD WITHOUT CONTRAST  Technique: Multiplanar, multiecho pulse sequences of the brain and surrounding structures were obtained according to standard protocol without intravenous contrast.  Findings: Subtle cortical restricted diffusion in the right superior frontal gyrus pre motor area (series 3 image 19, 18). Associated T2 and FLAIR hyperintensity, which is then inseparable from confluent cerebral white matter signal hyperintensity, also present in the left hemisphere.  No mass effect or definite hemorrhage at this site.  Diffusion elsewhere is within normal limits. Major intracranial vascular flow voids are preserved.  No ventriculomegaly. No midline shift, mass effect, or evidence of mass lesion.  Negative pituitary.  Negative cervicomedullary junction and visualized cervical spine except for bulky degenerative ligamentous hypertrophy about the odontoid.  Widespread cerebral white matter signal abnormality as previously described.  Additional widespread pontine white matter signal hyperintensity.  Moderate for age deep gray matter nuclei T2 heterogeneity.  The cerebellum remains within normal limits.  Visualized bone marrow signal is within normal limits.  Visualized orbit soft tissues are within normal limits.  Visualized paranasal sinuses and mastoids are clear.  Negative scalp soft tissues.  IMPRESSION: 1.  Small cortical acute to subacute infarct in the right frontal lobe premotor area.  No mass effect or definite hemorrhage. 2.  Underlying advanced white matter disease, nonspecific but most commonly due to small vessel disease. 3.  MRA findings are below.  MRA HEAD WITHOUT CONTRAST  Technique: Angiographic images of the Circle of Willis were obtained using MRA technique without  intravenous contrast.  Findings: Study is intermittently degraded by motion artifact despite repeated imaging attempts.  Antegrade flow in  codominant and mildly ectatic distal vertebral arteries.  Patent vertebrobasilar junction.  Normal PICA vessels. No basilar artery stenosis.  Superior cerebellar artery and PCA origins are within normal limits.  Posterior communicating arteries are diminutive or absent.  Bilateral PCA branches are within normal limits.  Antegrade flow in both ICA siphons.  Tortuous distal cervical left ICA.  No ICA siphon stenosis.  Patent carotid termini.  Dominant left ACA A1 segment.  Normal left ACA and bilateral MCA origins. Normal anterior communicating artery.  Grossly normal visualized ACA branches.  Bilateral MCA M1 branches are patent without stenosis.  Grossly normal visualized bilateral MCA branches.  IMPRESSION: Degraded  by motion despite repeated imaging attempts. No intracranial stenosis or major circle of Willis branch occlusion identified.  Original Report Authenticated By: Harley Hallmark, M.D.   US Carotid Duplex Bilateral  05/24/2011  *RADIOLOGY REPORT*  Clinical Data: TIA, history of hypertension and diabetes, now with left-sided weakness  BILATERAL CAROTID DUPLEX ULTRASOUND  Technique: Wallace Cullens scale imaging, color Doppler and duplex ultrasound was performed of bilateral carotid and vertebral arteries in the neck.  Comparison:  Head CT - 05/23/2011; Brain MRI - earlier same day  Criteria:  Quantification of carotid stenosis is based on velocity parameters that correlate the residual internal carotid diameter with NASCET-based stenosis levels, using the diameter of the distal internal carotid lumen as the denominator for stenosis measurement.  The following velocity measurements were obtained:                   PEAK SYSTOLIC/END DIASTOLIC RIGHT ICA:                        104/20cm/sec CCA:                        92/13cm/sec SYSTOLIC ICA/CCA RATIO:     1.14 DIASTOLIC ICA/CCA RATIO:    1.55 ECA:                        87cm/sec  LEFT ICA:                        81/14cm/sec CCA:                        104/17cm/sec  SYSTOLIC ICA/CCA RATIO:     0.87 DIASTOLIC ICA/CCA RATIO:    1.49 ECA:                        70cm/sec  Findings:  RIGHT CAROTID ARTERY: There is a minimal amount of eccentric echogenic plaque within the right carotid bulb extending to involve the origin of the right internal carotid artery which does not result in elevated peak systolic velocities to suggest hemodynamic significance.  RIGHT VERTEBRAL ARTERY:  Antegrade flow  LEFT CAROTID ARTERY: There is a minimal amount of eccentric echogenic plaque within the carotid bulb extending to involve the origin of the left internal carotid artery, not resulting in elevated peak systolic velocities to suggest hemodynamic significance.  LEFT VERTEBRAL ARTERY:  Antegrade flow.  IMPRESSION: Minimal amount of echogenic atherosclerotic plaque within the bilateral carotid bulbs extending to the origin of the bilateral internal carotid arteries, not resulting in hemodynamically significant stenosis.  Original Report Authenticated By: Waynard Reeds, M.D.   ECHO:Study Conclusions  - Left ventricle: The cavity size was normal. There was mild asymmetric hypertrophy of the septum. Systolic function was normal. The estimated ejection fraction was in the range of 55% to 60%. Wall motion was normal; there were no regional wall motion abnormalities. - Aortic valve: Mildly calcified annulus. Trileaflet; mildly thickened leaflets. Mild regurgitation. - Left atrium: The atrium was mildly dilated. - Atrial septum: No defect or patent foramen ovale was identified. - Pulmonary arteries: PA peak pressure: 33mm Hg (S). Transthoracic echocardiography. M-mode, complete 2D, spectral Doppler, and color Doppler. Height: Height: 165.1cm. Height: 65in. Weight: Weight: 73kg. Weight: 160.7lb. Body mass index: BMI: 26.8kg/m^2. Body surface area: BSA: 1.23m^2. Patient status: Inpatient. Location: Bedside.  EEG INTERPRETATION  INDICATION: This is a lady who presents with a spell  suspicious for  seizure activity. She is 78 year old.  MEDICATIONS: Tylenol, aspirin, donepezil, Lexapro, Lipitor, Lasix,  folic acid, insulin, Coumadin.  ANALYSIS: A 16-channel recording is conducted for approximately 20  minutes. There is a well-formed posterior dominant rhythm of 8 Hz with  attenuates with eye opening. Throughout most of the recording, sleep  activities are observed. Both K complexes, sleep spindles, and slow  wave sleep are seen. There is no focal or lateralized slowing. Photic  stimulation does not result any significant changes in the background  activity.  IMPRESSION: Normal recording for age. There is no epileptiform  activity observed.    Microbiology: No results found for this or any previous visit (from the past 240 hour(s)).   Labs: Results for orders placed during the hospital encounter of 05/23/11 (from the past 48 hour(s))  GLUCOSE, CAPILLARY     Status: Abnormal   Collection Time   05/24/11  5:15 PM      Component Value Range Comment   Glucose-Capillary 120 (*) 70 - 99 (mg/dL)    Comment 1 Documented in Chart      Comment 2 Notify RN     GLUCOSE, CAPILLARY     Status: Abnormal   Collection Time   05/24/11  9:05 PM      Component Value Range Comment   Glucose-Capillary 149 (*) 70 - 99 (mg/dL)   PROTIME-INR     Status: Abnormal   Collection Time   05/25/11  5:10 AM      Component Value Range Comment   Prothrombin Time 20.7 (*) 11.6 - 15.2 (seconds)    INR 1.74 (*) 0.00 - 1.49    BASIC METABOLIC PANEL     Status: Abnormal   Collection Time   05/25/11  5:10 AM      Component Value Range Comment   Sodium 136  135 - 145 (mEq/L)    Potassium 4.2  3.5 - 5.1 (mEq/L)    Chloride 104  96 - 112 (mEq/L)    CO2 24  19 - 32 (mEq/L)    Glucose, Bld 99  70 - 99 (mg/dL)    BUN 19  6 - 23 (mg/dL)    Creatinine, Ser 1.61  0.50 - 1.10 (mg/dL)    Calcium 9.2  8.4 - 10.5 (mg/dL)    GFR calc non Af Amer 46 (*) >90 (mL/min)    GFR calc Af Amer 53 (*) >90  (mL/min)   CBC     Status: Abnormal   Collection Time   05/25/11  5:10 AM      Component Value Range Comment   WBC 5.4  4.0 - 10.5 (K/uL)    RBC 3.44 (*) 3.87 - 5.11 (MIL/uL)    Hemoglobin 9.9 (*) 12.0 - 15.0 (g/dL)    HCT 09.6 (*) 04.5 - 46.0 (%)    MCV 88.1  78.0 - 100.0 (fL)    MCH 28.8  26.0 - 34.0 (pg)    MCHC 32.7  30.0 - 36.0 (g/dL)    RDW 40.9  81.1 - 91.4 (%)    Platelets 192  150 - 400 (K/uL)   GLUCOSE, CAPILLARY     Status: Normal   Collection Time   05/25/11  8:07 AM      Component Value Range Comment   Glucose-Capillary 98  70 - 99 (mg/dL)    Comment 1 Documented in Chart  Comment 2 Notify RN     GLUCOSE, CAPILLARY     Status: Abnormal   Collection Time   05/25/11 11:51 AM      Component Value Range Comment   Glucose-Capillary 109 (*) 70 - 99 (mg/dL)    Comment 1 Documented in Chart      Comment 2 Notify RN     GLUCOSE, CAPILLARY     Status: Abnormal   Collection Time   05/25/11  4:24 PM      Component Value Range Comment   Glucose-Capillary 137 (*) 70 - 99 (mg/dL)    Comment 1 Documented in Chart      Comment 2 Notify RN     GLUCOSE, CAPILLARY     Status: Abnormal   Collection Time   05/25/11  9:20 PM      Component Value Range Comment   Glucose-Capillary 144 (*) 70 - 99 (mg/dL)    Comment 1 Documented in Chart      Comment 2 Notify RN     PROTIME-INR     Status: Abnormal   Collection Time   05/26/11  4:28 AM      Component Value Range Comment   Prothrombin Time 18.3 (*) 11.6 - 15.2 (seconds)    INR 1.49  0.00 - 1.49    BASIC METABOLIC PANEL     Status: Abnormal   Collection Time   05/26/11  4:28 AM      Component Value Range Comment   Sodium 136  135 - 145 (mEq/L)    Potassium 3.8  3.5 - 5.1 (mEq/L)    Chloride 102  96 - 112 (mEq/L)    CO2 23  19 - 32 (mEq/L)    Glucose, Bld 103 (*) 70 - 99 (mg/dL)    BUN 19  6 - 23 (mg/dL)    Creatinine, Ser 1.61  0.50 - 1.10 (mg/dL)    Calcium 9.3  8.4 - 10.5 (mg/dL)    GFR calc non Af Amer 55 (*) >90 (mL/min)     GFR calc Af Amer 64 (*) >90 (mL/min)   CBC     Status: Abnormal   Collection Time   05/26/11  4:28 AM      Component Value Range Comment   WBC 5.3  4.0 - 10.5 (K/uL)    RBC 3.58 (*) 3.87 - 5.11 (MIL/uL)    Hemoglobin 10.3 (*) 12.0 - 15.0 (g/dL)    HCT 09.6 (*) 04.5 - 46.0 (%)    MCV 86.6  78.0 - 100.0 (fL)    MCH 28.8  26.0 - 34.0 (pg)    MCHC 33.2  30.0 - 36.0 (g/dL)    RDW 40.9  81.1 - 91.4 (%)    Platelets 201  150 - 400 (K/uL)   GLUCOSE, CAPILLARY     Status: Abnormal   Collection Time   05/26/11  8:01 AM      Component Value Range Comment   Glucose-Capillary 112 (*) 70 - 99 (mg/dL)    Comment 1 Documented in Chart      Comment 2 Notify RN     GLUCOSE, CAPILLARY     Status: Abnormal   Collection Time   05/26/11 11:58 AM      Component Value Range Comment   Glucose-Capillary 189 (*) 70 - 99 (mg/dL)    Comment 1 Documented in Chart      Comment 2 Notify RN        HPI : The patient is an  78 year old woman with a history significant for dementia, hypertension, type 2 diabetes mellitus, chronic atrial fibrillation, and a recent stroke. She presented to the emergency department on April 15th 2013 with a chief complaint of difficulty speaking and transient numbness in her right hand. She had a recent stroke in January of this year and was treated at [facility]. She was discharged to a rehabilitation facility and then discharged to home after several weeks. A home health nurse came by her home to evaluate her. When the patient told her of her symptoms, she recommended that the patient come to the emergency department for further evaluation. The patient had also been started on treatment with Bactrim for a urinary tract infection. In the emergency department, the patient was afebrile and hemodynamically stable. Her lab data were significant for a venous glucose of 181, negative urine drug screen, negative urinalysis, INR 1.61, hemoglobin of 10.7, normal cardiac enzymes, and a  serum sodium of 134. Her EKG revealed sinus rhythm, left axis deviation, heart rate of 75 beats per minute, and nonspecific T-wave changes. CT scan of her head revealed no acute findings. She was admitted for further evaluation and management.  HOSPITAL COURSE: The patient was restarted on Coumadin and aspirin. The pharmacy staff was consulted to assist with INR monitoring and Coumadin dosing. Her antihypertensive medications were withheld given that her blood pressure was normal and bordering on the low-normal side. All of her other medications including Keppra was continued. Sliding scale NovoLog and Lantus were adjusted based on her capillary blood glucose. For further evaluation and management, a number of studies were ordered. The MRI of her brain revealed an acute versus subacute small right frontal stroke. It was unclear whether or not this was a new stroke or the same stroke from several months ago. Her 2-D echocardiogram revealed preserved LV function with no significant valvular abnormality. Her carotid ultrasound revealed no significant ICA stenosis. The results of the fasting lipid pulse bowel were dictated above. Based on the findings, she was started on Lipitor. The EEG revealed no epileptiform activity. Her vitamin B12 level was within normal limits. Her hemoglobin A1c was 6.5. The results of her anemia panel were dictated above. Because her low-normal ferritin level, she was started on ferrous sulfate upon discharge. Her urinalysis was essentially normal.   The patient was evaluated by both the physical therapist and occupational therapist. It was recommended that the patient return to short-term skilled nursing facility for ongoing rehabilitation. This was discussed with the patient and her son. They were both in agreement.  During the hospitalization, the patient remained hemodynamically stable and afebrile. Her INR fell to 1.49 upon discharge. Therefore, she was discharged on bridging  Lovenox, given that it was believed that her strokes were embolic in nature from chronic paroxysmal atrial fibrillation. She also has anemia and because of the decrease in her hemoglobin, she was empirically started on Protonix. Aspirin was subsequently discontinued. She had no evidence of dysarthria or dysphasia or aphasia. She did have mild left-sided weakness. She did have some unsteadiness with her gait.   Discharge Exam: Blood pressure 111/68, pulse 83, temperature 97.5 F (36.4 C), temperature source Oral, resp. rate 20, height 5\' 5"  (1.651 m), weight 73.3 kg (161 lb 9.6 oz), SpO2 100.00%.  Lungs: Clear to auscultation bilaterally. Heart: S1, S2, with a soft systolic murmur. Abdomen: Positive bowel sounds, soft, nontender, nondistended. Neurologic: She is alert and oriented to herself in the hospital. She has a mild left-sided facial  droop. She has 5 minus to 4+ over 5 left upper left lower extremity strength. Otherwise her strength on the right is 5 over 5.   Discharge Orders    Future Orders Please Complete By Expires   Diet - low sodium heart healthy      Diet Carb Modified      Increase activity slowly         Total discharge time: 40 minutes.  Signed: Scot Shiraishi 05/26/2011, 1:02 PM

## 2011-05-26 NOTE — Consult Note (Addendum)
ANTICOAGULATION CONSULT NOTE   Pharmacy Consult for Warfarin and Lovenox Indication: atrial fibrillation  Allergies  Allergen Reactions  . Ciprofloxacin     Patient states that she may have had a stroke due to this medication  . Niacin And Related Hives   Patient Measurements: Height: 5\' 5"  (165.1 cm) Weight: 161 lb 9.6 oz (73.3 kg) IBW/kg (Calculated) : 57   Vital Signs: Temp: 97.5 F (36.4 C) (04/18 0515) Temp src: Oral (04/18 0515) BP: 111/68 mmHg (04/18 0515) Pulse Rate: 83  (04/18 0904)  Labs:  Basename 05/26/11 0428 05/25/11 0510 05/24/11 0500 05/23/11 1955  HGB 10.3* 9.9* -- --  HCT 31.0* 30.3* -- 31.6*  PLT 201 192 -- 222  APTT -- -- -- 33  LABPROT 18.3* 20.7* 20.8* --  INR 1.49 1.74* 1.76* --  HEPARINUNFRC -- -- -- --  CREATININE 0.93 1.08 -- 1.05  CKTOTAL -- -- -- 74  CKMB -- -- -- 1.8  TROPONINI -- -- -- <0.30   Estimated Creatinine Clearance: 45.1 ml/min (by C-G formula based on Cr of 0.93).  Medical History: Past Medical History  Diagnosis Date  . Stroke   . Atrial fibrillation   . Diabetes mellitus   . Hypertension   . Coronary artery disease   . Hyperlipidemia 05/25/2011  . Anemia 05/25/2011   Medications:  Prescriptions prior to admission  Medication Sig Dispense Refill  . aspirin EC 81 MG tablet Take 81 mg by mouth every morning. At 700      . B Complex-C (SUPER B COMPLEX) TABS Take 1 tablet by mouth every morning. At 700      . calcium carbonate (OS-CAL) 600 MG TABS Take 600 mg by mouth 2 (two) times daily with a meal.      . donepezil (ARICEPT) 10 MG tablet Take 10 mg by mouth.      . escitalopram (LEXAPRO) 5 MG tablet Take 5 mg by mouth every morning.      . folic acid (FOLVITE) 1 MG tablet Take 1 mg by mouth every morning. At 700      . furosemide (LASIX) 20 MG tablet Take 20 mg by mouth every morning. At 700      . hydrALAZINE (APRESOLINE) 50 MG tablet Take 50 mg by mouth every 8 (eight) hours.      . insulin glargine (LANTUS SOLOSTAR)  100 UNIT/ML injection Inject 20 Units into the skin at bedtime as needed. **If blood sugars are less than 200--DO NOT GIVE INJECTION**      . levETIRAcetam (KEPPRA) 500 MG tablet Take 500 mg by mouth 2 (two) times daily.      Marland Kitchen lisinopril (PRINIVIL,ZESTRIL) 40 MG tablet Take 40 mg by mouth every morning. At 700      . metoprolol tartrate (LOPRESSOR) 25 MG tablet Take 25 mg by mouth 2 (two) times daily.      . Multiple Vitamins-Minerals (CENTRUM SILVER ULTRA WOMENS PO) Take 1 tablet by mouth every morning. At 700      . oxybutynin (DITROPAN-XL) 10 MG 24 hr tablet Take 10 mg by mouth every morning. At 700      . potassium chloride SA (K-DUR,KLOR-CON) 20 MEQ tablet Take 20 mEq by mouth every morning. At 700      . sulfamethoxazole-trimethoprim (BACTRIM DS,SEPTRA DS) 800-160 MG per tablet Take 1 tablet by mouth 2 (two) times daily.      Marland Kitchen warfarin (COUMADIN) 2.5 MG tablet Take 2.5 mg by mouth daily. Take one tablet in  addition to Coumadin 4mg  on Thursdays and Saturdays      . warfarin (COUMADIN) 3 MG tablet Take 3 mg by mouth See admin instructions. Take one tablet in addition to Coumadin 4mg  for  A total of 7mg  on Mondays, Tuesdays, Wednesdays, Fridays, and Sundays of each week.      . warfarin (COUMADIN) 4 MG tablet Take 4 mg by mouth See admin instructions. Take one tablet by mouth in addition to Coumadin 2.5mg  for a total of 6.5 mg on Thursdays and Saturdays.      Marland Kitchen zolpidem (AMBIEN) 5 MG tablet Take 5 mg by mouth at bedtime.       Assessment: INR below target and trending down Complicated home dosing regimen  Goal of Therapy:  INR 2-3 Full dose Lovenox until INR therapeutic   Plan: Coumadin 7.5mg  today x 1 INR daily Simplify regimen if possible Lovenox 1.5mg /Kg SQ daily for now  Helen [name] A 05/26/2011,12:49 PM

## 2011-05-26 NOTE — Progress Notes (Signed)
UR Chart Review Completed  

## 2011-05-28 LAB — GLUCOSE, CAPILLARY
Glucose-Capillary: 220 mg/dL — ABNORMAL HIGH (ref 70–99)
Glucose-Capillary: 212 mg/dL — ABNORMAL HIGH (ref 70–99)

## 2011-05-29 LAB — GLUCOSE, CAPILLARY
Glucose-Capillary: 196 mg/dL — ABNORMAL HIGH (ref 70–99)
Glucose-Capillary: 167 mg/dL — ABNORMAL HIGH (ref 70–99)
Glucose-Capillary: 175 mg/dL — ABNORMAL HIGH (ref 70–99)
Glucose-Capillary: 194 mg/dL — ABNORMAL HIGH (ref 70–99)

## 2011-05-30 LAB — GLUCOSE, CAPILLARY
Comment 1: 316241
Comment 1: 342731
Comment 2: 316241
Glucose-Capillary: 77 mg/dL (ref 70–99)
Glucose-Capillary: 184 mg/dL — ABNORMAL HIGH (ref 70–99)

## 2011-05-31 LAB — GLUCOSE, CAPILLARY: Glucose-Capillary: 188 mg/dL — ABNORMAL HIGH (ref 70–99)

## 2011-06-01 ENCOUNTER — Ambulatory Visit (HOSPITAL_COMMUNITY)
Admit: 2011-06-01 | Discharge: 2011-06-01 | Disposition: A | Discharge status: Home / Self Care | Payer: Medicare Other | Attending: Internal Medicine | Admitting: Internal Medicine

## 2011-06-01 DIAGNOSIS — M25519 Pain in unspecified shoulder: Secondary | ICD-10-CM | POA: Insufficient documentation

## 2011-06-01 LAB — GLUCOSE, CAPILLARY: Glucose-Capillary: 109 mg/dL — ABNORMAL HIGH (ref 70–99)

## 2011-06-02 LAB — GLUCOSE, CAPILLARY
Glucose-Capillary: 230 mg/dL — ABNORMAL HIGH (ref 70–99)
Glucose-Capillary: 189 mg/dL — ABNORMAL HIGH (ref 70–99)

## 2011-06-03 LAB — GLUCOSE, CAPILLARY: Glucose-Capillary: 250 mg/dL — ABNORMAL HIGH (ref 70–99)

## 2011-06-04 LAB — GLUCOSE, CAPILLARY: Glucose-Capillary: 198 mg/dL — ABNORMAL HIGH (ref 70–99)

## 2011-06-06 LAB — GLUCOSE, CAPILLARY
Glucose-Capillary: 107 mg/dL — ABNORMAL HIGH (ref 70–99)
Glucose-Capillary: 171 mg/dL — ABNORMAL HIGH (ref 70–99)

## 2011-06-07 LAB — GLUCOSE, CAPILLARY: Glucose-Capillary: 189 mg/dL — ABNORMAL HIGH (ref 70–99)

## 2011-06-08 LAB — GLUCOSE, CAPILLARY: Glucose-Capillary: 110 mg/dL — ABNORMAL HIGH (ref 70–99)

## 2011-06-09 LAB — PROTIME-INR

## 2011-06-10 ENCOUNTER — Ambulatory Visit (HOSPITAL_COMMUNITY)
Admit: 2011-06-10 | Discharge: 2011-06-10 | Disposition: A | Discharge status: Home / Self Care | Payer: Medicare Other | Attending: Internal Medicine | Admitting: Internal Medicine

## 2011-06-10 DIAGNOSIS — M79609 Pain in unspecified limb: Secondary | ICD-10-CM | POA: Insufficient documentation

## 2011-06-13 LAB — GLUCOSE, CAPILLARY
Glucose-Capillary: 110 mg/dL — ABNORMAL HIGH (ref 70–99)
Glucose-Capillary: 88 mg/dL (ref 70–99)

## 2011-06-14 LAB — GLUCOSE, CAPILLARY
Glucose-Capillary: 112 mg/dL — ABNORMAL HIGH (ref 70–99)
Glucose-Capillary: 171 mg/dL — ABNORMAL HIGH (ref 70–99)

## 2011-06-15 ENCOUNTER — Ambulatory Visit (HOSPITAL_COMMUNITY)
Admit: 2011-06-15 | Discharge: 2011-06-15 | Disposition: A | Discharge status: Home / Self Care | Payer: Medicare Other | Attending: Internal Medicine | Admitting: Internal Medicine

## 2011-06-15 DIAGNOSIS — S7000XA Contusion of unspecified hip, initial encounter: Secondary | ICD-10-CM | POA: Insufficient documentation

## 2011-06-15 DIAGNOSIS — W19XXXA Unspecified fall, initial encounter: Secondary | ICD-10-CM | POA: Insufficient documentation

## 2011-06-15 DIAGNOSIS — M25559 Pain in unspecified hip: Secondary | ICD-10-CM | POA: Insufficient documentation

## 2011-06-16 ENCOUNTER — Ambulatory Visit (INDEPENDENT_AMBULATORY_CARE_PROVIDER_SITE_OTHER): Payer: Medicare Other | Admitting: Family Medicine

## 2011-06-16 ENCOUNTER — Encounter: Payer: Self-pay | Admitting: Family Medicine

## 2011-06-16 LAB — GLUCOSE, CAPILLARY: Glucose-Capillary: 211 mg/dL — ABNORMAL HIGH (ref 70–99)

## 2011-06-16 NOTE — Progress Notes (Signed)
Patient ID: Alayha Babineaux, female   DOB: 1925/01/22, 78 y.o.   MRN: [id number] Pt brought in by her son Mr.Shuff and daughter in law, [facility]. She is under the care by the Senior care physician Dr. Leanord Hawking. They wanted to come in to establish with a new primary doctor she will be moving in with her son and Samara Snide she is previously from [facility]. He did bring some records with her from her recent hospitalizations. She is in the [facility] secondary to embolic stroke and atrial fibrillation. They are unaware of when her discharge date is. I advised him since she is currently in a nursing home and I cannot become her primary doctor until after she is released. They will call back for an appointment time for a new patient after they have a discharge date. They would not be billed for this visit.Note we discussed this with the [facility] as well.

## 2011-06-18 LAB — GLUCOSE, CAPILLARY
Glucose-Capillary: 115 mg/dL — ABNORMAL HIGH (ref 70–99)
Glucose-Capillary: 208 mg/dL — ABNORMAL HIGH (ref 70–99)

## 2011-06-19 LAB — GLUCOSE, CAPILLARY: Glucose-Capillary: 239 mg/dL — ABNORMAL HIGH (ref 70–99)

## 2011-06-21 LAB — GLUCOSE, CAPILLARY: Glucose-Capillary: 222 mg/dL — ABNORMAL HIGH (ref 70–99)

## 2011-06-22 LAB — GLUCOSE, CAPILLARY: Glucose-Capillary: 177 mg/dL — ABNORMAL HIGH (ref 70–99)

## 2011-06-23 LAB — GLUCOSE, CAPILLARY
Glucose-Capillary: 93 mg/dL (ref 70–99)
Glucose-Capillary: 281 mg/dL — ABNORMAL HIGH (ref 70–99)

## 2011-06-26 LAB — GLUCOSE, CAPILLARY: Glucose-Capillary: 210 mg/dL — ABNORMAL HIGH (ref 70–99)

## 2011-06-27 LAB — GLUCOSE, CAPILLARY
Glucose-Capillary: 96 mg/dL (ref 70–99)
Glucose-Capillary: 190 mg/dL — ABNORMAL HIGH (ref 70–99)

## 2011-06-28 LAB — GLUCOSE, CAPILLARY
Glucose-Capillary: 119 mg/dL — ABNORMAL HIGH (ref 70–99)
Glucose-Capillary: 203 mg/dL — ABNORMAL HIGH (ref 70–99)

## 2011-06-29 LAB — GLUCOSE, CAPILLARY: Glucose-Capillary: 119 mg/dL — ABNORMAL HIGH (ref 70–99)

## 2011-07-02 LAB — GLUCOSE, CAPILLARY
Glucose-Capillary: 186 mg/dL — ABNORMAL HIGH (ref 70–99)
Glucose-Capillary: 123 mg/dL — ABNORMAL HIGH (ref 70–99)
Glucose-Capillary: 265 mg/dL — ABNORMAL HIGH (ref 70–99)

## 2011-07-03 LAB — GLUCOSE, CAPILLARY
Glucose-Capillary: 100 mg/dL — ABNORMAL HIGH (ref 70–99)
Glucose-Capillary: 231 mg/dL — ABNORMAL HIGH (ref 70–99)

## 2011-07-04 LAB — GLUCOSE, CAPILLARY: Glucose-Capillary: 89 mg/dL (ref 70–99)

## 2011-07-05 LAB — GLUCOSE, CAPILLARY: Glucose-Capillary: 82 mg/dL (ref 70–99)

## 2011-07-06 ENCOUNTER — Ambulatory Visit (INDEPENDENT_AMBULATORY_CARE_PROVIDER_SITE_OTHER): Payer: Medicare Other | Admitting: Orthopedic Surgery

## 2011-07-06 ENCOUNTER — Encounter: Payer: Self-pay | Admitting: Orthopedic Surgery

## 2011-07-06 VITALS — BP 120/50 | Ht 65.0 in | Wt 165.0 lb

## 2011-07-06 DIAGNOSIS — M719 Bursopathy, unspecified: Secondary | ICD-10-CM

## 2011-07-06 DIAGNOSIS — M67919 Unspecified disorder of synovium and tendon, unspecified shoulder: Secondary | ICD-10-CM | POA: Insufficient documentation

## 2011-07-06 LAB — GLUCOSE, CAPILLARY: Glucose-Capillary: 93 mg/dL (ref 70–99)

## 2011-07-06 NOTE — Patient Instructions (Signed)
You have received a steroid shot. 15% of patients experience increased pain at the injection site with in the next 24 hours. This is best treated with ice and tylenol extra strength 2 tabs every 8 hours. If you are still having pain please call the office.    

## 2011-07-06 NOTE — Progress Notes (Signed)
  Subjective:    Helen [name] is a 78 y.o. female  The following portions of the patient's history were reviewed and updated as appropriate: allergies, current medications, past family history, past medical history, past social history, past surgical history and problem list.   Review of Systems A comprehensive review of systems was negative.   Objective:    BP 122/72  Ht 4\' 11"  (1.499 m)  Wt 61.236 kg (135 lb)  BMI 27.27 kg/m2        Vital signs are stable as recorded  General appearance is normal  The patient is alert and oriented x3  The patient's mood and affect are normal  Gait assessment: ROLLING WALKER  The cardiovascular exam reveals normal pulses and temperature without edema swelling.  The lymphatic system is negative for palpable lymph nodes  The sensory exam is normal.  There are no pathologic reflexes.  Balance is normal.   Exam of the LEFT SHOULDER  Inspection POSTERIOR JOINT TENDERNESS, GREATER TUB TENDERNESS AND ALTERAL DELTOID TENDERNESS Range of motion AROM = 100 FE Stability NORMAL  Strength WEAK ROT CUFF Skin NORMAL + IMPINGEMENT       Assessment:    Left shoulder pain    Plan:    Shoulder injection. See procedure note.   Subacromial Shoulder Injection Procedure Note  Pre-operative Diagnosis: left RC Syndrome  Post-operative Diagnosis: same  Indications: pain   Anesthesia: ethyl chloride   Procedure Details   Verbal consent was obtained for the procedure. The shoulder was prepped withalcohol and the skin was anesthetized. A 20 gauge needle was advanced into the subacromial space through posterior approach without difficulty  The space was then injected with 3 ml 1% lidocaine and 1 ml of depomedrol. The injection site was cleansed with isopropyl alcohol and a dressing was applied.  Complications:  None; patient tolerated the procedure well.

## 2011-07-07 ENCOUNTER — Ambulatory Visit: Payer: Medicare Other | Admitting: Family Medicine

## 2011-07-15 ENCOUNTER — Ambulatory Visit: Payer: PRIVATE HEALTH INSURANCE | Admitting: Family Medicine

## 2011-07-15 ENCOUNTER — Ambulatory Visit (INDEPENDENT_AMBULATORY_CARE_PROVIDER_SITE_OTHER): Payer: Medicare Other | Admitting: Family Medicine

## 2011-07-15 ENCOUNTER — Encounter: Payer: Self-pay | Admitting: Family Medicine

## 2011-07-15 VITALS — BP 122/72 | HR 52 | Resp 18 | Wt 166.1 lb

## 2011-07-15 DIAGNOSIS — I4891 Unspecified atrial fibrillation: Secondary | ICD-10-CM

## 2011-07-15 DIAGNOSIS — F0390 Unspecified dementia without behavioral disturbance: Secondary | ICD-10-CM

## 2011-07-15 DIAGNOSIS — E119 Type 2 diabetes mellitus without complications: Secondary | ICD-10-CM

## 2011-07-15 DIAGNOSIS — I10 Essential (primary) hypertension: Secondary | ICD-10-CM

## 2011-07-15 DIAGNOSIS — G40909 Epilepsy, unspecified, not intractable, without status epilepticus: Secondary | ICD-10-CM

## 2011-07-15 DIAGNOSIS — I639 Cerebral infarction, unspecified: Secondary | ICD-10-CM

## 2011-07-15 DIAGNOSIS — I480 Paroxysmal atrial fibrillation: Secondary | ICD-10-CM

## 2011-07-15 DIAGNOSIS — I6340 Cerebral infarction due to embolism of unspecified cerebral artery: Secondary | ICD-10-CM

## 2011-07-15 DIAGNOSIS — D649 Anemia, unspecified: Secondary | ICD-10-CM

## 2011-07-15 DIAGNOSIS — E785 Hyperlipidemia, unspecified: Secondary | ICD-10-CM

## 2011-07-15 NOTE — Patient Instructions (Signed)
Continue current meds No lasix No Insulin  Take your blood sugar - fasting and 2 hours after meal  I will set up your therapy and check on the coumadin levels Given 10 units of insulin if blood sugars are 300 or more  F/U 4 weeks

## 2011-07-17 ENCOUNTER — Encounter: Payer: Self-pay | Admitting: Family Medicine

## 2011-07-17 DIAGNOSIS — G40909 Epilepsy, unspecified, not intractable, without status epilepticus: Secondary | ICD-10-CM | POA: Insufficient documentation

## 2011-07-17 NOTE — Progress Notes (Signed)
  Subjective:    Patient ID: Helen [name], female    DOB: Apr 22, 1924, 78 y.o.   MRN: [id number]  HPI Pt presents to establish care, medications and history reviewed Recently discharged from [facility] after recurrent stroke, she has left hemiparesis, mostly lower ext. History of multiple strokes, in Jan seen at [facility], concern she had underlying seizure as well therefore started on Keppra, no seizure activity since. Prior to [facility] she was in another rehab facility in [place] for stroke. She needs home PT set up.  History of A fib, recently established with Dr. Clydell Hakim at [facility], son does her INR at home [facility]in Coupe [facility] [phone]? She is now living with son- home is equip  Review of Systems  GEN- denies fatigue, fever, weight loss,+weakness, recent illness HEENT- denies eye drainage, change in vision, nasal discharge, CVS- denies chest pain, palpitations RESP- denies SOB, cough, wheeze ABD- denies N/V, change in stools, abd pain GU- denies dysuria, hematuria, dribbling, incontinence MSK- + joint pain, muscle aches, injury Neuro- denies headache, dizziness, syncope, seizure activity       Objective:   Physical Exam GEN- NAD, alert and oriented x3, sitting in wheelchair HEENT- PERRL, EOMI, non injected sclera, pink conjunctiva, MMM, oropharynx clear Neck- Supple,  CVS- RRR, no murmur RESP-CTAB ABD-NABS,soft, NT,ND EXT- No edema Pulses- Radial, DP- 2+ Neuro- unable to walk without assistance, drags left foot, CNII-XII grossly in tact, normal speech Psych-normal mood and affect       Assessment & Plan:

## 2011-07-17 NOTE — Assessment & Plan Note (Signed)
Cholesterol sub-optimal, I will obtain records from her cardiologist, with her age and other co-morbidites he may not have goal for aggressive therapy

## 2011-07-17 NOTE — Assessment & Plan Note (Signed)
Blood pressure at goal, no change to meds 

## 2011-07-17 NOTE — Assessment & Plan Note (Signed)
Followed by cardiology, on coumadin, I will need to check into the self coumadin monitoring, I have not had another pt with this

## 2011-07-17 NOTE — Assessment & Plan Note (Addendum)
Continue aricept, mentation and thought process appropriate today

## 2011-07-17 NOTE — Assessment & Plan Note (Addendum)
She uses CBG > 250 before giving any lantus, I believe this was started in the nursing home, A1C was 6.5% 2 months ago, I dont think that she needs lantus, the family is very concerned about her blood sugars being high, more in the evening, she will need another A1C by our recheck, I will likley D/C lantus all together, consider low dose glipizide daily, avoid metformin based on age and renal status

## 2011-07-17 NOTE — Assessment & Plan Note (Signed)
Normocytic anemia, no evidence of active bleed

## 2011-07-26 ENCOUNTER — Telehealth: Payer: Self-pay | Admitting: Family Medicine

## 2011-07-26 MED ORDER — LISINOPRIL 10 MG PO TABS: 10.0000 mg | ORAL_TABLET | ORAL | Status: DC

## 2011-07-26 MED ORDER — TRAMADOL HCL 50 MG PO TABS: 50.0000 mg | ORAL_TABLET | Freq: Two times a day (BID) | ORAL | Status: DC | PRN

## 2011-07-26 NOTE — Telephone Encounter (Signed)
I received a message from [place] pt PT, she is doing well in PT but he states she has had a couple of falls and was complaining about LLE pain, he states she had a positive Homan's sign and minimal swelling in legs and a bruise on RUE from a previous fall. I called both numbers listed in chart for pt, will await call back to see what is needed

## 2011-07-26 NOTE — Addendum Note (Signed)
Addended by: Milinda Antis F on: 07/26/2011 05:35 PM   Modules accepted: Orders

## 2011-07-26 NOTE — Telephone Encounter (Signed)
I spoke with pt son, the leg pain is not new, she slid to the floor the other day felt her knee was giving out, she does not have any swelling, the past has been there at many months but the tylenol is not helping. They are asking for a stronger med.  Since she is on coumadin, less likley she will have DVT, will give ultram if no improvement she will be sent for imaging, son voiced understanding

## 2011-07-28 ENCOUNTER — Ambulatory Visit (HOSPITAL_COMMUNITY): Payer: Medicare Other | Attending: Family Medicine

## 2011-07-28 ENCOUNTER — Ambulatory Visit (INDEPENDENT_AMBULATORY_CARE_PROVIDER_SITE_OTHER): Payer: Medicare Other | Admitting: Family Medicine

## 2011-07-28 ENCOUNTER — Encounter: Payer: Self-pay | Admitting: Family Medicine

## 2011-07-28 VITALS — BP 128/72 | HR 54 | Resp 18 | Ht 65.0 in | Wt 166.0 lb

## 2011-07-28 DIAGNOSIS — M79609 Pain in unspecified limb: Secondary | ICD-10-CM

## 2011-07-28 DIAGNOSIS — M25561 Pain in right knee: Secondary | ICD-10-CM

## 2011-07-28 DIAGNOSIS — L8990 Pressure ulcer of unspecified site, unspecified stage: Secondary | ICD-10-CM

## 2011-07-28 DIAGNOSIS — M79606 Pain in leg, unspecified: Secondary | ICD-10-CM

## 2011-07-28 DIAGNOSIS — W19XXXA Unspecified fall, initial encounter: Secondary | ICD-10-CM | POA: Insufficient documentation

## 2011-07-28 DIAGNOSIS — M1710 Unilateral primary osteoarthritis, unspecified knee: Secondary | ICD-10-CM

## 2011-07-28 DIAGNOSIS — M25562 Pain in left knee: Secondary | ICD-10-CM | POA: Insufficient documentation

## 2011-07-28 DIAGNOSIS — IMO0002 Reserved for concepts with insufficient information to code with codable children: Secondary | ICD-10-CM

## 2011-07-28 DIAGNOSIS — M179 Osteoarthritis of knee, unspecified: Secondary | ICD-10-CM

## 2011-07-28 DIAGNOSIS — L8991 Pressure ulcer of unspecified site, stage 1: Secondary | ICD-10-CM

## 2011-07-28 DIAGNOSIS — R21 Rash and other nonspecific skin eruption: Secondary | ICD-10-CM | POA: Insufficient documentation

## 2011-07-28 DIAGNOSIS — Z7901 Long term (current) use of anticoagulants: Secondary | ICD-10-CM

## 2011-07-28 DIAGNOSIS — M25569 Pain in unspecified knee: Secondary | ICD-10-CM

## 2011-07-28 DIAGNOSIS — Z5181 Encounter for therapeutic drug level monitoring: Secondary | ICD-10-CM

## 2011-07-28 LAB — PROTIME-INR: Prothrombin Time: 26.1 seconds — ABNORMAL HIGH (ref 11.6–15.2)

## 2011-07-28 MED ORDER — POTASSIUM CHLORIDE ER 10 MEQ PO TBCR: 20.0000 meq | EXTENDED_RELEASE_TABLET | Freq: Every day | ORAL | Status: DC

## 2011-07-28 MED ORDER — CLOTRIMAZOLE-BETAMETHASONE 1-0.05 % EX CREA: TOPICAL_CREAM | CUTANEOUS | Status: DC

## 2011-07-28 MED ORDER — RESTORE BARRIER CREA: TOPICAL_CREAM | Status: DC

## 2011-07-28 NOTE — Assessment & Plan Note (Signed)
I'm concerned for an early stage I decubitus ulcer with that erythema noted at the sacrum or she has more pressure. I've advised him to use barrier cream and to keep her dry appear

## 2011-07-28 NOTE — Assessment & Plan Note (Signed)
X-rays does show osteoarthritis worse in the right knee compared to the left. I will send her to orthopedics who she's seen for her shoulder for possible knee injections. She will continue the tramadol.

## 2011-07-28 NOTE — Assessment & Plan Note (Addendum)
Unclear cause of rash she does is sit in depends. Discussed hygiene. She has a few vesicular lesions on her buttocks she has had shingles vaccine but will have the family watches to make sure it does not spread. I will start her on Lotrisone cream for the rash

## 2011-07-28 NOTE — Assessment & Plan Note (Signed)
Her falls appear to be related around her knees giving out and not confusion are medication induced. We will continue to monitor this. She is being followed by physical therapy.

## 2011-07-28 NOTE — Assessment & Plan Note (Signed)
She had increased complaint of left calf pain over the past few days. She is on Coumadin per previous INRs worse subtherapeutic. Venous ultrasound was done of the lower extremities there was no evidence of DVT

## 2011-07-28 NOTE — Progress Notes (Signed)
  Subjective:    Patient ID: Helen [name], female    DOB: [birth year], 78 y.o.   MRN: [id number]  HPI Patient presents status post recurrent fall. She states that her knees have been giving out on her when she stands and she dislikes the floor. Denies any injury to the head. Denies any seizure activity. See chart for previous notes regarding this as well as speaking with her physical therapist. She was started on tramadol which has helped her pain and she is able to sleep better. Her son was also told by the nurse that she had a rash on her buttocks. Her Coumadin level was checked last week and was supratherapeutic. He's been unable to check this with the machine at home today her cardiologist is following the Coumadin level.    Review of Systems    GEN- denies fatigue, fever, weight loss,+weakness, recent illness HEENT- denies eye drainage, change in vision, nasal discharge, CVS- denies chest pain, palpitations RESP- denies SOB, cough, wheeze ABD- denies N/V, change in stools, abd pain GU- denies dysuria, hematuria, dribbling, incontinence MSK- + joint pain, muscle aches, injury Neuro- denies headache, dizziness, syncope, seizure activity    Objective:   Physical Exam GEN- NAD, alert and oriented x3, sitting in wheelchair CVS- RRR, no murmur RESP-CTAB ABD-NABS,soft, NT,ND EXT- No edema Pulses- Radial, DP- 2+ MSK- Right knee small effusion, minimal crepitus, fair ROM, no pain with palpation, Left knee- no effusion,no bony abnormality, good ROM Hip- neg IR/ER Skin- erythema at sacrum extending into buttocks crease, Right buttocks raised erythematous maculopapular rash, 4cm above near sacrum erythema with a 3 vesicles        Assessment & Plan:

## 2011-07-28 NOTE — Addendum Note (Signed)
Addended by: Milinda Antis F on: 07/28/2011 06:14 PM   Modules accepted: Orders

## 2011-07-28 NOTE — Patient Instructions (Signed)
Get the xrays done  Ultrasound of leg to be done Please have the coumadin level drawn Continue tramadol Watch the rash on her bottom to make sure it does not spread Lotrisone for the rash Barrier cream for the redness between the cheeks, keep dry, do not stay in wet depends or pads Keep previous f/u appt  Please call cardiology to f/u her coumadin

## 2011-07-29 ENCOUNTER — Telehealth (INDEPENDENT_AMBULATORY_CARE_PROVIDER_SITE_OTHER): Payer: Self-pay | Admitting: *Deleted

## 2011-07-29 NOTE — Telephone Encounter (Signed)
Please let him know her last few sets of labs potassium has been normal. Potassium will not cause her legs to buckle or give out, it can however cause leg cramping. I have refilled the potassium, I do not think this is the cause her her weakness.

## 2011-07-29 NOTE — Telephone Encounter (Signed)
Son aware of advise.

## 2011-07-29 NOTE — Telephone Encounter (Signed)
Ronnie, patient's son, would like to speak with Dr. Jeanice Lim when she gets a min. Aarion Kittrell was just seen yesterday, 07/28/11. Has a question about his mothers potassium. Could this cause weakness in her legs? Ronnie's return phone number is [phone].

## 2011-08-02 ENCOUNTER — Telehealth: Payer: Self-pay | Admitting: Family Medicine

## 2011-08-02 MED ORDER — LISINOPRIL 10 MG PO TABS: ORAL_TABLET | ORAL | Status: DC

## 2011-08-02 NOTE — Addendum Note (Signed)
Addended by: Kandis Fantasia B on: 08/02/2011 02:07 PM   Modules accepted: Orders, Medications

## 2011-08-03 ENCOUNTER — Other Ambulatory Visit: Payer: Self-pay | Admitting: Family Medicine

## 2011-08-03 ENCOUNTER — Telehealth: Payer: Self-pay | Admitting: Family Medicine

## 2011-08-03 NOTE — Telephone Encounter (Signed)
Noted  

## 2011-08-03 NOTE — Telephone Encounter (Signed)
Spoke with pharmacy and approved refills on meds.

## 2011-08-05 ENCOUNTER — Telehealth: Payer: Self-pay | Admitting: Family Medicine

## 2011-08-05 NOTE — Telephone Encounter (Signed)
YES WAS RECEIVED AND PUT ON DRS DESK FOR COMPLETION

## 2011-08-08 ENCOUNTER — Telehealth: Payer: Self-pay | Admitting: Family Medicine

## 2011-08-08 NOTE — Telephone Encounter (Signed)
Verbal order given for pt to continue PT

## 2011-08-15 ENCOUNTER — Encounter: Payer: Self-pay | Admitting: Family Medicine

## 2011-08-15 ENCOUNTER — Ambulatory Visit (INDEPENDENT_AMBULATORY_CARE_PROVIDER_SITE_OTHER): Payer: Medicare Other | Admitting: Family Medicine

## 2011-08-15 VITALS — BP 110/70 | HR 57 | Resp 16 | Ht 65.0 in | Wt 164.1 lb

## 2011-08-15 DIAGNOSIS — L609 Nail disorder, unspecified: Secondary | ICD-10-CM

## 2011-08-15 DIAGNOSIS — M25569 Pain in unspecified knee: Secondary | ICD-10-CM

## 2011-08-15 DIAGNOSIS — M25562 Pain in left knee: Secondary | ICD-10-CM

## 2011-08-15 DIAGNOSIS — E119 Type 2 diabetes mellitus without complications: Secondary | ICD-10-CM

## 2011-08-15 DIAGNOSIS — M25561 Pain in right knee: Secondary | ICD-10-CM

## 2011-08-15 DIAGNOSIS — W19XXXA Unspecified fall, initial encounter: Secondary | ICD-10-CM

## 2011-08-15 DIAGNOSIS — I10 Essential (primary) hypertension: Secondary | ICD-10-CM

## 2011-08-15 NOTE — Progress Notes (Signed)
  Subjective:    Patient ID: Helen [name], female    DOB: 1924-12-17, 78 y.o.   MRN: [id number]  HPI Pt here for intermin visit, doing well no concerns, family member attempted to cut nails and was concerned about left great toe, needs foot doctor. No knee pain recently, to be seen by orthopedics this week Rash on buttocks has resolved Coumadin being managed by cardiology Admitted overnight for weakness and confusion sustained a fall, cleared by hospital no change to meds, now with PT three times a week    Review of Systems      GEN- denies fatigue, fever, weight loss,+weakness, recent illness HEENT- denies eye drainage, change in vision, nasal discharge, CVS- denies chest pain, palpitations RESP- denies SOB, cough, wheeze ABD- denies N/V, change in stools, abd pain GU- denies dysuria, hematuria, dribbling, incontinence MSK- + joint pain, muscle aches, injury Neuro- denies headache, dizziness, syncope, seizure activity       Objective:   Physical Exam  GEN- NAD, alert and oriented x3, sitting in wheelchair, well appearing  CVS- RRR, no murmur RESP-CTAB ABD-NABS,soft, NT,ND EXT- No edema Pulses- Radial, DP- 2+ MSK- no effusion, minimal crepitus, fair ROM, no pain with palpation, Left knee- no effusion,no bony abnormality, good ROM Skin- abrasion to rigtht lateral knee  Nail- thickened great toenails, no signs of cellulitis or infection      Assessment & Plan:

## 2011-08-15 NOTE — Patient Instructions (Addendum)
F/U with Dr. Romeo Apple Continue your current medications Get the labs done before our next visit  I will refer you to foot doctor for nail trimming  F/U 3 months

## 2011-08-16 NOTE — Assessment & Plan Note (Signed)
Pt to see ortho, no change to meds

## 2011-08-16 NOTE — Assessment & Plan Note (Signed)
Rare use of meds, plan for repeat A1C before next visit

## 2011-08-16 NOTE — Assessment & Plan Note (Signed)
Pt increased, she is doing well with this, I have discussed with PT, home has been readied for her saftey

## 2011-08-16 NOTE — Assessment & Plan Note (Signed)
Bp stable on meds

## 2011-08-17 ENCOUNTER — Ambulatory Visit (INDEPENDENT_AMBULATORY_CARE_PROVIDER_SITE_OTHER): Payer: Medicare Other | Admitting: Orthopedic Surgery

## 2011-08-17 ENCOUNTER — Encounter: Payer: Self-pay | Admitting: Orthopedic Surgery

## 2011-08-17 VITALS — BP 126/80 | Ht 65.0 in | Wt 164.0 lb

## 2011-08-17 DIAGNOSIS — M1710 Unilateral primary osteoarthritis, unspecified knee: Secondary | ICD-10-CM

## 2011-08-17 DIAGNOSIS — M159 Polyosteoarthritis, unspecified: Secondary | ICD-10-CM | POA: Insufficient documentation

## 2011-08-17 NOTE — Progress Notes (Signed)
  Subjective:    Patient ID: Helen [name], female    DOB: [birth year], 78 y.o.   MRN: [id number]  Knee Pain  Incident onset: 2 years  There was no injury mechanism. The pain is present in the left knee and right knee. The quality of the pain is described as aching. The pain is at a severity of 2/10. The pain is mild. The pain has been constant since onset. Associated symptoms include muscle weakness. Pertinent negatives include no inability to bear weight, loss of motion, loss of sensation, numbness or tingling.      Review of Systems  Neurological: Negative for tingling and numbness.  Positive findings include snoring, diarrhea, urgency, unsteady gait, and easy bruising     Objective:   Physical Exam  This patient shows normal appearance, she is oriented x3, her mood and affect are, normal. She's been using a wheelchair for the last month, although she uses a walker with wheels with a therapist here at has excellent flexion in both knees. Mild varus deformity. A slight flexion contracture, but her ligaments are stable. The strength in her legs is normal. Her skin is intact. The pulses are good. She has no lymphadenopathy. Sensation is normal are no pathologic reflexes, and there's no coordination issues.  X-rays show bilateral DJD moderate to mild      Assessment & Plan:  Osteoarthritis both knees.  Currently, her pain is controlled with Aspercreme and Tylenol. The weakness, which was of concern, has improved. Continue current management

## 2011-08-17 NOTE — Patient Instructions (Signed)
activities as tolerated 

## 2011-08-25 ENCOUNTER — Encounter: Payer: Self-pay | Admitting: Family Medicine

## 2011-08-25 ENCOUNTER — Ambulatory Visit (INDEPENDENT_AMBULATORY_CARE_PROVIDER_SITE_OTHER): Payer: Medicare Other | Admitting: Family Medicine

## 2011-08-25 VITALS — BP 130/64 | HR 58 | Resp 18 | Ht 65.0 in | Wt 161.0 lb

## 2011-08-25 DIAGNOSIS — R350 Frequency of micturition: Secondary | ICD-10-CM

## 2011-08-25 DIAGNOSIS — N3941 Urge incontinence: Secondary | ICD-10-CM | POA: Insufficient documentation

## 2011-08-25 LAB — POCT URINALYSIS DIPSTICK
Leukocytes, UA: NEGATIVE
Nitrite, UA: NEGATIVE
Protein, UA: NEGATIVE
Urobilinogen, UA: 0.2
pH, UA: 6

## 2011-08-25 MED ORDER — TOLTERODINE TARTRATE ER 2 MG PO CP24: 2.0000 mg | ORAL_CAPSULE | Freq: Every day | ORAL | Status: DC

## 2011-08-25 NOTE — Patient Instructions (Signed)
Stop the vesicare Start the detrol once a day Continue all other meds Please call if this does not help  Keep previous appt

## 2011-08-25 NOTE — Assessment & Plan Note (Signed)
UA negative for infection., she looks very well, I think this is more urge incontinence and overactive bladder. Will try her on new agent- Detrol, she is at max dose of Vescicare, if this does not help, will call urology for intervention

## 2011-08-25 NOTE — Progress Notes (Signed)
  Subjective:    Patient ID: Helen [name], female    DOB: December 31, 1924, 78 y.o.   MRN: [id number]  HPI   Pt presents with worsening nocuturia, history of OAB/urge incontinence  currently on Vesicare, denies abdominal pain, dysuria, change in stools. She is getting up 4-5 x a night, she thinks she was on a higher dose of her Vesicare, she also tried Ditropan and this did not help.She has a similar problem when her Vesicare was previously changed to ditropan.  She has had a few accidents trying to get to the restroom on time. She will follow-up with her urologist in Sept, Dr. Judie Petit at [facility]. She feels overall very well and has no other specific complaints or concerns   Review of Systems     GEN- denies fatigue, fever, weight loss,weakness, recent illness HEENT- denies eye drainage, change in vision, nasal discharge, CVS- denies chest pain, palpitations RESP- denies SOB, cough, wheeze ABD- denies N/V, change in stools, abd pain GU- denies dysuria, hematuria, dribbling, incontinence MSK- denies  joint pain, muscle aches, injury Neuro- denies headache, dizziness, syncope, seizure activity    Objective:   Physical Exam GEN- NAD, alert and oriented x3, well appearing, sitting in wheelchair HEENT-  non injected sclera, pink conjunctiva, MMM, oropharynx clear CVS- RRR, no murmur RESP-CTAB ABD-NABS,soft,NT,ND EXT- No edema Pulses- Radial, DP- 2+  UA-negative       Assessment & Plan:

## 2011-09-09 ENCOUNTER — Other Ambulatory Visit: Payer: Self-pay

## 2011-09-09 MED ORDER — LEVETIRACETAM 500 MG PO TABS: 500.0000 mg | ORAL_TABLET | Freq: Two times a day (BID) | ORAL | Status: DC

## 2011-09-09 MED ORDER — ATORVASTATIN CALCIUM 10 MG PO TABS: 10.0000 mg | ORAL_TABLET | Freq: Every day | ORAL | Status: DC

## 2011-09-09 MED ORDER — METOPROLOL TARTRATE 25 MG PO TABS: ORAL_TABLET | ORAL | Status: DC

## 2011-09-11 ENCOUNTER — Other Ambulatory Visit: Payer: Self-pay | Admitting: Family Medicine

## 2011-11-09 ENCOUNTER — Other Ambulatory Visit: Payer: Self-pay | Admitting: Family Medicine

## 2011-11-21 ENCOUNTER — Ambulatory Visit (INDEPENDENT_AMBULATORY_CARE_PROVIDER_SITE_OTHER): Payer: Medicare Other | Admitting: Family Medicine

## 2011-11-21 ENCOUNTER — Encounter: Payer: Self-pay | Admitting: Family Medicine

## 2011-11-21 VITALS — BP 140/70 | HR 59 | Resp 15 | Ht 65.0 in | Wt 176.0 lb

## 2011-11-21 DIAGNOSIS — F0390 Unspecified dementia without behavioral disturbance: Secondary | ICD-10-CM

## 2011-11-21 DIAGNOSIS — I480 Paroxysmal atrial fibrillation: Secondary | ICD-10-CM

## 2011-11-21 DIAGNOSIS — I10 Essential (primary) hypertension: Secondary | ICD-10-CM

## 2011-11-21 DIAGNOSIS — I4891 Unspecified atrial fibrillation: Secondary | ICD-10-CM

## 2011-11-21 DIAGNOSIS — N3941 Urge incontinence: Secondary | ICD-10-CM

## 2011-11-21 DIAGNOSIS — E119 Type 2 diabetes mellitus without complications: Secondary | ICD-10-CM

## 2011-11-21 MED ORDER — GLIPIZIDE 5 MG PO TABS: 5.0000 mg | ORAL_TABLET | Freq: Every day | ORAL | Status: DC

## 2011-11-21 MED ORDER — ZOLPIDEM TARTRATE 5 MG PO TABS: 5.0000 mg | ORAL_TABLET | Freq: Every day | ORAL | Status: DC

## 2011-11-21 NOTE — Patient Instructions (Addendum)
Get the blood drawn today  Ambien refilled Start glipizide  Stop the lantus  Flu shot given F/U 4 months

## 2011-11-22 LAB — HEMOGLOBIN A1C
Hgb A1c MFr Bld: 7.2 % — ABNORMAL HIGH (ref <5.7)
Mean Plasma Glucose: 160 mg/dL — ABNORMAL HIGH (ref <117)

## 2011-11-22 LAB — BASIC METABOLIC PANEL
Calcium: 9.3 mg/dL (ref 8.4–10.5)
Creat: 0.98 mg/dL (ref 0.50–1.10)
Sodium: 136 mEq/L (ref 135–145)

## 2011-11-23 ENCOUNTER — Encounter: Payer: Self-pay | Admitting: Family Medicine

## 2011-11-23 NOTE — Assessment & Plan Note (Signed)
They are using insulin infrequently, her sugars have been running in 200-300's though, will check A1C, start Glipizide and D/C Lantus

## 2011-11-23 NOTE — Progress Notes (Signed)
  Subjective:    Patient ID: Helen [name], female    DOB: 1924/03/17, 78 y.o.   MRN: [id number]  HPI Pt here to f/u chronic medical problems, no particular concerns, doing well Meds reviewed DM- son has had had to give a few doses of insulin but inconsistently CBG 120-200's, evening 160-300's BP at home 120-160/64-80 Seen by urology changed to [facility] and new bladder med at bedtime, never started ditropan Needs refill on ambien rarely uses   Review of Systems  GEN- denies fatigue, fever, weight loss,weakness, recent illness HEENT- denies eye drainage, change in vision, nasal discharge, CVS- denies chest pain, palpitations RESP- denies SOB, cough, wheeze ABD- denies N/V, change in stools, abd pain GU- denies dysuria, hematuria, dribbling, incontinence MSK- denies joint pain, muscle aches, injury Neuro- denies headache, dizziness, syncope, seizure activity      Objective:   Physical Exam GEN- NAD, alert and oriented x3, well appearing, sitting in wheelchair HEENT-  PERRL,non injected sclera, pink conjunctiva, MMM, oropharynx clear CVS- RRR, no murmur RESP-CTAB ABD-NABS,soft,NT,ND EXT- No edema Pulses- Radial, DP- 2+ Psych-normal affect and mood        Assessment & Plan:

## 2011-11-23 NOTE — Assessment & Plan Note (Signed)
New meds per urology

## 2011-11-23 NOTE — Assessment & Plan Note (Signed)
Doing well no behavioral concerns

## 2011-11-23 NOTE — Assessment & Plan Note (Addendum)
BP recheck looks good, doing well no symptoms, BP does fluctuate at home, goal is to avoid tight control that may cause her to be symptomatic

## 2011-11-23 NOTE — Assessment & Plan Note (Signed)
Doing well INR at goal followed by cardilogy with home monitoring system

## 2011-12-02 ENCOUNTER — Other Ambulatory Visit: Payer: Self-pay | Admitting: Family Medicine

## 2011-12-29 ENCOUNTER — Other Ambulatory Visit: Payer: Self-pay | Admitting: Family Medicine

## 2012-01-02 ENCOUNTER — Other Ambulatory Visit: Payer: Self-pay | Admitting: Family Medicine

## 2012-01-30 ENCOUNTER — Other Ambulatory Visit: Payer: Self-pay | Admitting: Family Medicine

## 2012-01-31 ENCOUNTER — Other Ambulatory Visit: Payer: Self-pay | Admitting: Family Medicine

## 2012-02-07 ENCOUNTER — Ambulatory Visit (HOSPITAL_COMMUNITY)
Admission: RE | Admit: 2012-02-07 | Discharge: 2012-02-07 | Disposition: A | Discharge status: Home / Self Care | Payer: Medicare Other | Source: Ambulatory Visit | Attending: Family Medicine | Admitting: Family Medicine

## 2012-02-07 ENCOUNTER — Ambulatory Visit (INDEPENDENT_AMBULATORY_CARE_PROVIDER_SITE_OTHER): Payer: Medicare Other | Admitting: Family Medicine

## 2012-02-07 ENCOUNTER — Encounter: Payer: Self-pay | Admitting: Family Medicine

## 2012-02-07 VITALS — BP 126/64 | HR 64 | Resp 18 | Ht 65.0 in | Wt 183.1 lb

## 2012-02-07 DIAGNOSIS — R197 Diarrhea, unspecified: Secondary | ICD-10-CM

## 2012-02-07 DIAGNOSIS — L8990 Pressure ulcer of unspecified site, unspecified stage: Secondary | ICD-10-CM

## 2012-02-07 DIAGNOSIS — E119 Type 2 diabetes mellitus without complications: Secondary | ICD-10-CM

## 2012-02-07 DIAGNOSIS — I10 Essential (primary) hypertension: Secondary | ICD-10-CM

## 2012-02-07 DIAGNOSIS — L8991 Pressure ulcer of unspecified site, stage 1: Secondary | ICD-10-CM

## 2012-02-07 LAB — POC HEMOCCULT BLD/STL (OFFICE/1-CARD/DIAGNOSTIC): Fecal Occult Blood, POC: NEGATIVE

## 2012-02-07 MED ORDER — DIPHENOXYLATE-ATROPINE 2.5-0.025 MG PO TABS: 1.0000 | ORAL_TABLET | Freq: Three times a day (TID) | ORAL | Status: DC | PRN

## 2012-02-07 NOTE — Assessment & Plan Note (Addendum)
Concern as some episodes have severe incontinence and pt not able to get to toilet, her symptoms however are not daily and occur a few times a week typically Sunday she had 3 episodes in 1 day but does not feel ill Meds reviewed She did not want to try imodium because it caused constipation Lomotil low dose Stool studies- Culture, C diff, fecal lactoferrin KUB shows no impaction

## 2012-02-07 NOTE — Assessment & Plan Note (Signed)
CBG in evening running a little high, last A1C 7.2%, continue oral meds, prefer her to run a little high then low

## 2012-02-07 NOTE — Assessment & Plan Note (Signed)
Reiterated keeping skin dry, use of barrier cream or desitin at this time, changing pads on regular basis especially with diarrhea

## 2012-02-07 NOTE — Progress Notes (Signed)
  Subjective:    Patient ID: Helen [name], female    DOB: Mar 05, 1924, 78 y.o.   MRN: [id number]  HPI  Diarrhea on and off for past 2 months, has episodes of incontinence and soils clothing and bed at times, no pain, no fever, no N/V, no recent change in meds,. Chronic bactrim therapy for recurrent UTI, feels well. Does not always feel urge to go to restroom. Diarrhea does not occur daily , happens a few times a week  DM- fasting 120-160, after meals 140-300 , no hypoglycemia, oral meds  Review of Systems   GEN- denies fatigue, fever, weight loss,weakness, recent illness HEENT- denies eye drainage, change in vision, nasal discharge, CVS- denies chest pain, palpitations RESP- denies SOB, cough, wheeze ABD- denies N/V, +change in stools, abd pain GU- denies dysuria, hematuria, dribbling, +incontinence MSK- +joint pain, muscle aches, injury Neuro- denies headache, dizziness, syncope, seizure activity      Objective:   Physical Exam GEN- NAD, alert and oriented x3, well appearing, sitting in wheelchair HEENT-  PERRL,non injected sclera, pink conjunctiva, MMM, oropharynx clear CVS- RRR, no murmur RESP-CTAB ABD-NABS,soft,NT,ND Rectum- loose tone, but not flacid, FOBT neg, stool in valut, sensation in tact Skin- erythema and irritation sacrum into gluteal cleft EXT- No edema Pulses- Radial,2+ Neuro- able to stand and walk a few steps, sensation grossly in tact       Assessment & Plan:

## 2012-02-07 NOTE — Patient Instructions (Addendum)
Stool samples to be obtained  KUB of stomach  Start new medication for diarrhea I will call for further instructions Start diarrhea medication once a day for now

## 2012-02-07 NOTE — Assessment & Plan Note (Signed)
Well controlled today.

## 2012-02-09 ENCOUNTER — Other Ambulatory Visit: Payer: Self-pay | Admitting: Family Medicine

## 2012-02-09 ENCOUNTER — Telehealth: Payer: Self-pay | Admitting: Family Medicine

## 2012-02-09 NOTE — Telephone Encounter (Signed)
Son aware that rx is ready for pickup at pharmacy. Stool specimen brought in.

## 2012-02-09 NOTE — Telephone Encounter (Signed)
Call pharmacy and see what can be done Also remind family to bring in sample and to put Desitin on her bottom because of irritation from moisture

## 2012-02-09 NOTE — Addendum Note (Signed)
Addended by: Kandis Fantasia B on: 02/09/2012 01:29 PM   Modules accepted: Orders

## 2012-02-10 LAB — FECAL LACTOFERRIN, QUANT: Lactoferrin: POSITIVE

## 2012-02-10 LAB — CLOSTRIDIUM DIFFICILE EIA: CDIFTX: NEGATIVE

## 2012-02-13 LAB — STOOL CULTURE

## 2012-02-26 ENCOUNTER — Other Ambulatory Visit: Payer: Self-pay | Admitting: Family Medicine

## 2012-03-18 ENCOUNTER — Other Ambulatory Visit: Payer: Self-pay | Admitting: Family Medicine

## 2012-03-26 ENCOUNTER — Ambulatory Visit (INDEPENDENT_AMBULATORY_CARE_PROVIDER_SITE_OTHER): Payer: Medicare Other | Admitting: Family Medicine

## 2012-03-26 ENCOUNTER — Other Ambulatory Visit: Payer: Self-pay | Admitting: Family Medicine

## 2012-03-26 ENCOUNTER — Encounter: Payer: Self-pay | Admitting: Family Medicine

## 2012-03-26 VITALS — BP 130/74 | HR 64 | Resp 16 | Ht 65.0 in | Wt 188.0 lb

## 2012-03-26 DIAGNOSIS — E119 Type 2 diabetes mellitus without complications: Secondary | ICD-10-CM

## 2012-03-26 DIAGNOSIS — R197 Diarrhea, unspecified: Secondary | ICD-10-CM

## 2012-03-26 NOTE — Progress Notes (Signed)
  Subjective:    Patient ID: Helen [name], female    DOB: 1924/02/28, 79 y.o.   MRN: [id number]  HPI  DM- CBG 120-158 fasting, evening 180-260 after meals, highest 300, no hypoglycemia Diarrhea- she noticed diarrhea has been present since she was drinking whole milk, they switched to 2% the past 2 days and no diarrhea, she for the past couple of weeks, had only after breakfast, otherwise no diarrhea and no stool incontinence. Denies abd pain, blood in stool.    Review of Systems  GEN- denies fatigue, fever, weight loss,weakness, recent illness HEENT- denies eye drainage, change in vision, nasal discharge, CVS- denies chest pain, palpitations RESP- denies SOB, cough, wheeze ABD- denies N/V, change in stools, abd pain GU- denies dysuria, hematuria, dribbling, incontinence MSK- denies joint pain, muscle aches, injury Neuro- denies headache, dizziness, syncope, seizure activity      Objective:   Physical Exam GEN- NAD, alert and oriented x3 HEENT- PERRL, EOMI, non injected sclera, pink conjunctiva, MMM, oropharynx clear CVS- RRR, no murmur RESP-CTAB ABD-NABS,soft,NT,ND EXT- No edema Pulses- Radial, DP- 2+        Assessment & Plan:

## 2012-03-26 NOTE — Assessment & Plan Note (Addendum)
Discussed diet as she has had some increasing BS, she feels well right now, will not change any meds, no hypoglycemia, they will call if sugars stay above 200 Discussed eye exam, her son to schedule, she needs new lenses

## 2012-03-26 NOTE — Patient Instructions (Addendum)
Try the 2% milk for the diarrhea Continue all other medications Call if your blood sugars stay above 200 Schedule a Eye Doctor  Appointment  F/U 3 months

## 2012-03-26 NOTE — Assessment & Plan Note (Addendum)
?   Related to whole milk, no previous milk intolerance, as improved will hold on referral to GI, no longer using lomotil, at most 1 loose stool a day. If anything changes send to GI She is fairly well functioning elderly female

## 2012-03-27 ENCOUNTER — Other Ambulatory Visit: Payer: Self-pay

## 2012-03-27 MED ORDER — POTASSIUM CHLORIDE ER 10 MEQ PO TBCR: EXTENDED_RELEASE_TABLET | ORAL | Status: DC

## 2012-03-27 MED ORDER — ATORVASTATIN CALCIUM 10 MG PO TABS: ORAL_TABLET | ORAL | Status: DC

## 2012-03-27 MED ORDER — LISINOPRIL 10 MG PO TABS: ORAL_TABLET | ORAL | Status: DC

## 2012-03-27 MED ORDER — LEVETIRACETAM 500 MG PO TABS: ORAL_TABLET | ORAL | Status: DC

## 2012-03-28 ENCOUNTER — Other Ambulatory Visit: Payer: Self-pay

## 2012-03-28 MED ORDER — POTASSIUM CHLORIDE ER 10 MEQ PO TBCR: EXTENDED_RELEASE_TABLET | ORAL | Status: DC

## 2012-03-28 MED ORDER — LEVETIRACETAM 500 MG PO TABS: ORAL_TABLET | ORAL | Status: DC

## 2012-03-28 MED ORDER — LISINOPRIL 10 MG PO TABS: ORAL_TABLET | ORAL | Status: DC

## 2012-03-28 MED ORDER — ATORVASTATIN CALCIUM 10 MG PO TABS: ORAL_TABLET | ORAL | Status: DC

## 2012-03-29 ENCOUNTER — Other Ambulatory Visit: Payer: Self-pay | Admitting: Family Medicine

## 2012-03-30 ENCOUNTER — Telehealth: Payer: Self-pay | Admitting: Family Medicine

## 2012-03-30 DIAGNOSIS — K529 Noninfective gastroenteritis and colitis, unspecified: Secondary | ICD-10-CM

## 2012-03-30 MED ORDER — DIPHENOXYLATE-ATROPINE 2.5-0.025 MG PO TABS: 1.0000 | ORAL_TABLET | Freq: Four times a day (QID) | ORAL | Status: DC | PRN

## 2012-03-30 NOTE — Telephone Encounter (Signed)
Son aware.

## 2012-03-30 NOTE — Telephone Encounter (Signed)
Please let pt know I will send her to GI to have her diarrhea looked at,  I will put her back on the lomotil to help slow it

## 2012-04-02 ENCOUNTER — Telehealth: Payer: Self-pay | Admitting: Family Medicine

## 2012-04-02 NOTE — Telephone Encounter (Signed)
Spoke with son on 03/30/2012 and addressed concern.

## 2012-04-12 ENCOUNTER — Other Ambulatory Visit: Payer: Self-pay | Admitting: Family Medicine

## 2012-04-17 ENCOUNTER — Ambulatory Visit: Payer: Self-pay | Admitting: Cardiovascular Disease

## 2012-04-17 DIAGNOSIS — Z7901 Long term (current) use of anticoagulants: Secondary | ICD-10-CM

## 2012-04-17 DIAGNOSIS — I480 Paroxysmal atrial fibrillation: Secondary | ICD-10-CM

## 2012-04-23 ENCOUNTER — Encounter: Payer: Self-pay | Admitting: Urgent Care

## 2012-04-23 ENCOUNTER — Ambulatory Visit (INDEPENDENT_AMBULATORY_CARE_PROVIDER_SITE_OTHER): Payer: Medicare Other | Admitting: Urgent Care

## 2012-04-23 VITALS — BP 157/70 | HR 58 | Temp 98.5°F | Ht 65.0 in | Wt 187.0 lb

## 2012-04-23 DIAGNOSIS — K921 Melena: Secondary | ICD-10-CM

## 2012-04-23 DIAGNOSIS — R197 Diarrhea, unspecified: Secondary | ICD-10-CM

## 2012-04-23 DIAGNOSIS — Z7901 Long term (current) use of anticoagulants: Secondary | ICD-10-CM

## 2012-04-23 MED ORDER — PEG 3350-KCL-NA BICARB-NACL 420 G PO SOLR: 4000.0000 mL | ORAL | Status: DC

## 2012-04-23 NOTE — Patient Instructions (Addendum)
Colonoscopy with Dr Jena Gauss Hold iron for 7 days prior to colonoscopy Return stool specimen to the lab as soon as possible. Please get your labs as soon as possible.  We will call you with results. Rectal Bleeding Rectal bleeding is when blood passes out of the anus. It is usually a sign that something is wrong. It may not be serious, but it should always be evaluated. Rectal bleeding may present as bright red blood or extremely dark stools. The color may range from dark red or maroon to black (like tar). It is important that the cause of rectal bleeding be identified so treatment can be started and the problem corrected. CAUSES   Hemorrhoids. These are enlarged (dilated) blood vessels or veins in the anal or rectal area.  Fistulas. Theseare abnormal, burrowing channels that usually run from inside the rectum to the skin around the anus. They can bleed.  Anal fissures. This is a tear in the tissue of the anus. Bleeding occurs with bowel movements.  Diverticulosis. This is a condition in which pockets or sacs project from the bowel wall. Occasionally, the sacs can bleed.  Diverticulitis. Thisis an infection involving diverticulosis of the colon.  Proctitis and colitis. These are conditions in which the rectum, colon, or both, can become inflamed and pitted (ulcerated).  Polyps and cancer. Polyps are non-cancerous (benign) growths in the colon that may bleed. Certain types of polyps turn into cancer.  Protrusion of the rectum. Part of the rectum can project from the anus and bleed.  Certain medicines.  Intestinal infections.  Blood vessel abnormalities. HOME CARE INSTRUCTIONS  Eat a high-fiber diet to keep your stool soft.  Limit activity.  Drink enough fluids to keep your urine clear or pale yellow.  Warm baths may be useful to soothe rectal pain.  Follow up with your caregiver as directed. SEEK IMMEDIATE MEDICAL CARE IF:  You develop increased bleeding.  You have black or  dark red stools.  You vomit blood or material that looks like coffee grounds.  You have abdominal pain or tenderness.  You have a fever.  You feel weak, nauseous, or you faint.  You have severe rectal pain or you are unable to have a bowel movement. MAKE SURE YOU:  Understand these instructions.  Will watch your condition.  Will get help right away if you are not doing well or get worse. Document Released: 07/16/2001 Document Revised: 04/18/2011 Document Reviewed: 07/11/2010 [facility] Patient Information 2013 [place], [place].

## 2012-04-23 NOTE — Progress Notes (Signed)
Referring Provider: Salley Scarlet, MD Primary Care Physician:  Milinda Antis, MD Primary Gastroenterologist:  Dr. Jena Gauss  Chief Complaint  Patient presents with  . Diarrhea    HPI:  Helen [name] is a 79 y.o. female here as a referral from Dr. Jeanice Lim for 6 month hx of diarrhea.  Taking lomotil 1 per day was working, then increased to BID.  No new medications, no travel, no new pets.  No recent anttibiotics except bactrim as maintenance for UTI.  No diarrhea 1-2 days per week.  Usually a single large explosive stool per day.  She saw bright red blood in her BM.  Denies abdominal pain.    +incontinence of bladder & bowels occasionally.  Denies heartburn, indigestion, nausea, vomiting, dysphagia, odynophagia or anorexia.  Last INR 2.8 last Wed.  On coumadin for chronic a fib, hx CVA & TIAs.    C diff toxin Negative, Lactoferrin positive, iFOBT negative,stool cx negative  Past Medical History  Diagnosis Date  . Stroke   . Atrial fibrillation   . Diabetes mellitus   . Hypertension   . Coronary artery disease   . Hyperlipidemia 05/25/2011  . Anemia 05/25/2011  . Seizures     Noted at [facility] in Jan 2012, started on Keppra  . Depression   . Arthritis     Past Surgical History  Procedure Laterality Date  . Appendectomy    . Abdominal hysterectomy    . Back surgery  1990s  . Colonoscopy  2007    [facility]:  2 polyps-cannot remember?    Current Outpatient Prescriptions  Medication Sig Dispense Refill  . atorvastatin (LIPITOR) 10 MG tablet TAKE 1 TABLET BY MOUTH EVERY DAY AT 6PM  30 tablet  3  . diphenoxylate-atropine (LOMOTIL) 2.5-0.025 MG per tablet Take 1 tablet by mouth 4 (four) times daily as needed for diarrhea or loose stools.  30 tablet  1  . donepezil (ARICEPT) 10 MG tablet TAKE 1 TABLET BY MOUTH DAILY  30 tablet  11  . escitalopram (LEXAPRO) 5 MG tablet TAKE 1 TABLET BY MOUTH EVERY MORNING .  30 tablet  3  . ferrous sulfate 325 (65 FE) MG tablet Take 1 tablet (325 mg total) by  mouth daily with breakfast.      . fesoterodine (TOVIAZ) 8 MG TB24 Take 8 mg by mouth daily.      . folic acid (FOLVITE) 1 MG tablet Take 1 mg by mouth every morning. At 700      . glipiZIDE (GLUCOTROL) 5 MG tablet TAKE 1 TABLET BY MOUTH EVERY DAY  30 tablet  3  . levETIRAcetam (KEPPRA) 500 MG tablet TAKE 1 TABLET BY MOUTH TWICE DAILY  60 tablet  3  . lisinopril (PRINIVIL,ZESTRIL) 10 MG tablet TAKE 1 TABLET BY MOUTH EVERY DAY  30 tablet  3  . metoprolol tartrate (LOPRESSOR) 25 MG tablet TAKE 1/2 TABLET BY MOUTH TWICE DAILY  30 tablet  3  . Multiple Vitamins-Minerals (CENTRUM SILVER ULTRA WOMENS PO) Take 1 tablet by mouth every morning. At 700      . potassium chloride (KLOR-CON 10) 10 MEQ tablet TAKE 2 TABLETS BY MOUTH EVERY DAY  60 tablet  3  . potassium chloride SA (K-DUR,KLOR-CON) 20 MEQ tablet Take 20 mEq by mouth every morning. At 700      . protective barrier (RESTORE) CREA Apply to bottom twice a day  120 g  3  . traMADol (ULTRAM) 50 MG tablet Take 1 tablet (50 mg total) by  mouth 2 (two) times daily as needed for pain.  45 tablet  1  . warfarin (COUMADIN) 7.5 MG tablet Take 4 mg by mouth daily. 4 mg 1 pill 2 time a week then 2 pills 5 times a week      . zolpidem (AMBIEN) 5 MG tablet Take 1 tablet (5 mg total) by mouth at bedtime.  14 tablet  2   No current facility-administered medications for this visit.    Allergies as of 04/23/2012 - Review Complete 04/23/2012  Allergen Reaction Noted  . Ciprofloxacin  05/23/2011  . Niacin and related Hives 05/23/2011  . Statins  07/15/2011    Family History:There is no known family history of colorectal carcinoma , liver disease, or inflammatory bowel disease.  Problem Relation Age of Onset  . Heart disease Father     History   Social History  . Marital Status: Widowed    Spouse Name: N/A    Number of Children: 1  . Years of Education: N/A   Occupational History  . retired, Audiological scientist    Social History Main Topics  . Smoking  status: Never Smoker   . Smokeless tobacco: Not on file  . Alcohol Use: No  . Drug Use: No  . Sexually Active: Not on file   Other Topics Concern  . 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 Resp: Denies dyspnea at rest, dyspnea with exercise, cough, sputum, wheezing, coughing up blood, and pleurisy. GI: Denies vomiting blood, jaundice. GU : Denies urinary burning, blood in urine, urinary frequency, urinary hesitancy, nocturnal urination, and urinary incontinence. MS: Denies joint pain, limitation of movement, and swelling, stiffness, low back pain, extremity pain. Denies muscle weakness, cramps, atrophy.  Derm: Denies rash, itching, dry skin, hives, moles, warts, or unhealing ulcers.  Psych: Denies depression, anxiety, memory loss, suicidal ideation, hallucinations, paranoia, and confusion. Heme: Denies bruising, bleeding, and enlarged lymph nodes. Neuro:  Denies any headaches, dizziness, paresthesias. Endo:  Denies any problems with DM, thyroid, adrenal function.  Physical Exam: BP 157/70  Pulse 58  Temp(Src) 98.5 F (36.9 C) (Oral)  Ht 5\' 5"  (1.651 m)  Wt 187 lb (84.823 kg)  BMI 31.12 kg/m2 No LMP recorded. Patient has had a hysterectomy. General:   Alert,  Well-developed, well-nourished, pleasant and cooperative in NAD Head:  Normocephalic and atraumatic. Eyes:  Sclera clear, no icterus.   Conjunctiva pink. Ears:  Normal auditory acuity. Nose:  No deformity, discharge, or lesions. Mouth:  No deformity or lesions,oropharynx pink & moist. Neck:  Supple; no masses or thyromegaly. Lungs:  Clear throughout to auscultation.   No wheezes, crackles, or rhonchi. No  acute distress. Heart:  Regular rate and rhythm; no murmurs, clicks, rubs,  or gallops. Abdomen:  Normal bowel sounds.  No bruits.  Soft, non-tender and non-distended without masses, hepatosplenomegaly or hernias noted.  No guarding or rebound tenderness.   Rectal:  Deferred. Msk:  Symmetrical without gross deformities. Normal posture. Pulses:  Normal pulses noted. Extremities:  No clubbing.  +trace lower ext edema bilat.    Neurologic:  Alert and oriented x4;  grossly normal neurologically. Skin:  Intact  without significant lesions or rashes. Lymph Nodes:  No significant cervical adenopathy. Psych:  Alert and cooperative. Normal mood and affect.

## 2012-04-25 LAB — TSH: TSH: 2.402 u[IU]/mL (ref 0.350–4.500)

## 2012-04-25 LAB — COMPREHENSIVE METABOLIC PANEL
ALT: 20 U/L (ref 0–35)
Alkaline Phosphatase: 69 U/L (ref 39–117)
CO2: 23 mEq/L (ref 19–32)
Sodium: 137 mEq/L (ref 135–145)
Total Bilirubin: 0.3 mg/dL (ref 0.3–1.2)
Total Protein: 6.3 g/dL (ref 6.0–8.3)

## 2012-04-25 LAB — CBC WITH DIFFERENTIAL/PLATELET
Eosinophils Absolute: 0.1 10*3/uL (ref 0.0–0.7)
Lymphs Abs: 1.5 10*3/uL (ref 0.7–4.0)
MCH: 31.4 pg (ref 26.0–34.0)
Neutro Abs: 4.2 10*3/uL (ref 1.7–7.7)
Neutrophils Relative %: 69 % (ref 43–77)
Platelets: 198 10*3/uL (ref 150–400)
RBC: 4.04 MIL/uL (ref 3.87–5.11)
WBC: 6.1 10*3/uL (ref 4.0–10.5)

## 2012-04-25 NOTE — Assessment & Plan Note (Addendum)
Helen [name] is a pleasant 79 y.o. female with chronic explosive diarrhea most days of the week with incontinence.  She has had hematochezia as well.Last colonoscopy approx 7 years ago at [facility] & pt believes it was normal.  Colonoscopy with Dr Jena Gauss for further evaluation.  Differentials include colorectal carcinoma, polyp, microscopic colitis with benign anorectal bleeding, or less likely infectious colitis.    We did discuss the fact that she is at a slightly higher risk of GI bleeding given the fact that she is on Coumadin, however the benefits of remaining on the Coumadin outweigh the risk of bleeding. We discussed the life threatening nature of an embolic event. She agrees to remain on Coumadin for this procedure. She also understands that a second procedure may be required since she is on Coumadin if she shows signs of significant bleeding or is in need of significant intervention that cannot be performed while on Coumadin.    CBC, CMP, TSH C diff PCR Hold iron for 7 days prior to colonoscopy Rectal bleed literature

## 2012-04-26 ENCOUNTER — Encounter: Payer: Self-pay | Admitting: Urgent Care

## 2012-04-26 LAB — CLOSTRIDIUM DIFFICILE BY PCR: Toxigenic C. Difficile by PCR: NOT DETECTED

## 2012-04-26 NOTE — Progress Notes (Signed)
Quick Note:  Please let pt know blood ct, electrolytes, liver & thyroid normal. Blood sugars over 200, please work with your PCP on this. No c diff. Keep colonoscopy as planned. ZO:XWRUEA, Kingsley Spittle, MD  ______

## 2012-04-26 NOTE — Progress Notes (Signed)
Dawn, Please see if we can get PATH from [facility] at [facility]. Thanks

## 2012-04-26 NOTE — Progress Notes (Signed)
Faxed to PCP

## 2012-04-27 ENCOUNTER — Encounter (HOSPITAL_COMMUNITY): Payer: Self-pay | Admitting: Pharmacy Technician

## 2012-04-27 NOTE — Progress Notes (Signed)
Quick Note:  pts son is aware. She is already scheduled for a tcs.  cc'd Dr. Jeanice Lim ______

## 2012-04-27 NOTE — Progress Notes (Signed)
Lab results faxed to PCP, request for path to [facility] faxed

## 2012-05-03 ENCOUNTER — Encounter (HOSPITAL_COMMUNITY)
Admission: RE | Disposition: A | Discharge status: Home / Self Care | Payer: Self-pay | Source: Ambulatory Visit | Attending: Internal Medicine

## 2012-05-03 ENCOUNTER — Encounter (HOSPITAL_COMMUNITY): Payer: Self-pay | Admitting: *Deleted

## 2012-05-03 ENCOUNTER — Ambulatory Visit (HOSPITAL_COMMUNITY)
Admission: RE | Admit: 2012-05-03 | Discharge: 2012-05-03 | Disposition: A | Discharge status: Home / Self Care | Payer: Medicare Other | Source: Ambulatory Visit | Attending: Internal Medicine | Admitting: Internal Medicine

## 2012-05-03 DIAGNOSIS — K5730 Diverticulosis of large intestine without perforation or abscess without bleeding: Secondary | ICD-10-CM

## 2012-05-03 DIAGNOSIS — D126 Benign neoplasm of colon, unspecified: Secondary | ICD-10-CM | POA: Insufficient documentation

## 2012-05-03 DIAGNOSIS — R197 Diarrhea, unspecified: Secondary | ICD-10-CM

## 2012-05-03 DIAGNOSIS — Z860100 Personal history of colon polyps, unspecified: Secondary | ICD-10-CM | POA: Insufficient documentation

## 2012-05-03 DIAGNOSIS — I10 Essential (primary) hypertension: Secondary | ICD-10-CM | POA: Insufficient documentation

## 2012-05-03 DIAGNOSIS — E119 Type 2 diabetes mellitus without complications: Secondary | ICD-10-CM | POA: Insufficient documentation

## 2012-05-03 DIAGNOSIS — K921 Melena: Secondary | ICD-10-CM

## 2012-05-03 HISTORY — PX: COLONOSCOPY: SHX5424

## 2012-05-03 LAB — PROTIME-INR
INR: 1.71 — ABNORMAL HIGH (ref 0.00–1.49)
Prothrombin Time: 19.5 seconds — ABNORMAL HIGH (ref 11.6–15.2)

## 2012-05-03 SURGERY — COLONOSCOPY
Anesthesia: Moderate Sedation

## 2012-05-03 MED ORDER — MEPERIDINE HCL 100 MG/ML IJ SOLN
INTRAMUSCULAR | Status: DC | PRN
  Administered 2012-05-03 (×2): 25 mg via INTRAVENOUS

## 2012-05-03 MED ORDER — EPINEPHRINE HCL 0.1 MG/ML IJ SOLN
INTRAMUSCULAR | Status: AC
  Filled 2012-05-03: qty 20

## 2012-05-03 MED ORDER — MIDAZOLAM HCL 5 MG/5ML IJ SOLN
INTRAMUSCULAR | Status: DC | PRN
  Administered 2012-05-03: 2 mg via INTRAVENOUS
  Administered 2012-05-03 (×3): 1 mg via INTRAVENOUS

## 2012-05-03 MED ORDER — SODIUM CHLORIDE 0.9 % IV SOLN
INTRAVENOUS | Status: DC
Start: 2012-05-03 — End: 2012-05-03
  Administered 2012-05-03: 09:00:00 via INTRAVENOUS

## 2012-05-03 MED ORDER — MIDAZOLAM HCL 5 MG/5ML IJ SOLN
INTRAMUSCULAR | Status: AC
  Filled 2012-05-03: qty 10

## 2012-05-03 MED ORDER — MEPERIDINE HCL 50 MG/ML IJ SOLN
INTRAMUSCULAR | Status: AC
  Filled 2012-05-03: qty 1

## 2012-05-03 MED ORDER — STERILE WATER FOR IRRIGATION IR SOLN
Status: DC | PRN
  Administered 2012-05-03: 10:00:00

## 2012-05-03 MED ORDER — ONDANSETRON HCL 4 MG/2ML IJ SOLN
INTRAMUSCULAR | Status: AC
  Filled 2012-05-03: qty 2

## 2012-05-03 MED ORDER — SODIUM CHLORIDE 0.9 % IV SOLN
INTRAVENOUS | Status: DC | PRN
  Administered 2012-05-03: 10 mL via INTRAMUSCULAR

## 2012-05-03 MED ORDER — SODIUM CHLORIDE 0.9 % IJ SOLN
INTRAMUSCULAR | Status: DC | PRN
  Administered 2012-05-03 (×2)

## 2012-05-03 MED ORDER — ONDANSETRON HCL 4 MG/2ML IJ SOLN
INTRAMUSCULAR | Status: DC | PRN
  Administered 2012-05-03: 4 mg via INTRAVENOUS

## 2012-05-03 MED ORDER — MEPERIDINE HCL 100 MG/ML IJ SOLN
INTRAMUSCULAR | Status: AC
  Filled 2012-05-03: qty 2

## 2012-05-03 NOTE — Interval H&P Note (Signed)
History and Physical Interval Note:  05/03/2012 9:37 AM  Helen [name]  has presented today for surgery, with the diagnosis of Chronic Diarrhea and Hematochezia  The various methods of treatment have been discussed with the patient and family. After consideration of risks, benefits and other options for treatment, the patient has consented to  Procedure(s) with comments: COLONOSCOPY (N/A) - 9:30 as a surgical intervention .  The patient's history has been reviewed, patient examined, no change in status, stable for surgery.  I have reviewed the patient's chart and labs.  Questions were answered to the patient's satisfaction.     Jody Aguinaga  Colonoscopy for diarrhea and history of polyps. Coumadin held x2 days. INR 1.71 this morning. Recent labs to our office okay except for blood sugar 211. Colonoscopy per plan today.The risks, benefits, limitations, alternatives and imponderables have been reviewed with the patient. Questions have been answered. All parties are agreeable.

## 2012-05-03 NOTE — Op Note (Addendum)
[facility] [address] [place], [address]   COLONOSCOPY PROCEDURE REPORT  PATIENT: Helen [name], Helen [name]  MR#:         [id number] BIRTHDATE: [birth year] , 87  yrs. old GENDER: Female ENDOSCOPIST: R.  Roetta Sessions, MD FACP FACG REFERRED BY:  Milinda Antis, M.D.  Thurmon Fair, M.D. PROCEDURE DATE:  05/03/2012 PROCEDURE:     Colonoscopy with snare polypectomy and segmental biopsy; collection of stool sample; Coumadin was stopped by patient on March 26 (last dose March 25); INR 1.71 this morning.  INDICATIONS: Chronic diarrhea; history of colonic polyps  INFORMED CONSENT:  The risks, benefits, alternatives and imponderables including but not limited to bleeding, perforation as well as the possibility of a missed lesion have been reviewed.  The potential for biopsy, lesion removal, etc. have also been discussed.  Questions have been answered.  All parties agreeable. Please see the history and physical in the medical record for more information.  MEDICATIONS: Versed 6 mg IV and Demerol 50 mg IV in divided doses. Zofran 4 mg IV.  DESCRIPTION OF PROCEDURE:  After a digital rectal exam was performed, the EC-[id number] ([id number])  colonoscope was advanced from the anus through the rectum and colon to the area of the cecum, ileocecal valve and appendiceal orifice.  The cecum was deeply durhintubated.  These structures were well-seen and photographed for the record.  From the level of the cecum and ileocecal valve, the scope was slowly and cautiously withdrawn.  The mucosal surfaces were carefully surveyed utilizing scope tip deflection to facilitate fold flattening as needed.  The scope was pulled down into the rectum where a thorough examination including retroflexion was performed.    FINDINGS:  Preparation adequate. Grossly normal rectum. Left-sided diverticula; redundant and elongated colon. Patient multiple sessile polyps in the ascending segment down to the  ileocecal valve. The patient had a 1 x 2 cm carpet polyp opposite the ileocecal valve with adjacent  5mm x 1 cm carpet polyp. There was also a 2-2.5 cm x 1 cm polyp on a fold in the mid ascending segment with at least one other smaller adjacent sessile polyp.  THERAPEUTIC / DIAGNOSTIC MANEUVERS PERFORMED:  The largest polyp seen opposite the ileocecal valve was raised away from the colonic wall with 12 cc of 10,000 epinephrine (some of the epinephrine was lost in the ejection process). Also normal saline was used to lift it away additionally. Piecemeal hot-snare  polypectomy was performed. Most of this lesion was removed. At one point because of the difficult approach, there is partial guillotine of the edge of the  polyp with cold snare polypectomy occurring. There was some oozing in this manever.   With additional epinephrine injection and 1 resolution clip good hemostasis was achieved.  The smaller adjacent polyp was removed with one pass of snare cautery. It was also lifted away from the colonic wall with 2 cc of 1-10,000 epinephrine. Because the patient was a bit "oozy" recently, having come off Coumadin, I elected not to manipulate the remaining ascending colon polyps. I did perform segmental biopsies of the ascending and sigmoid segments. A brief briefly attempted to intubate the terminal ileum but this was in the vicinity of the larger polyps that were piecemeal removed and clipped. This was ultimately not done as I did not want to disturb the polypectomy site and any further.  COMPLICATIONS: None  CECAL WITHDRAWAL TIME:  39 minutes  IMPRESSION:  Multiple colonic polyps-  status post saline-assisted piecemeal polypectomy. All  polyps not removed per plan. Colonic diverticulosis.  RECOMMENDATIONS:  Discussed at length with son.  I would prefer for the patient not to be anticoagulated for a full 5 days. Concerns about TIAs previously with interruption of Coumadin discussed.  In this scenario, I feel it is important for patient not to be anticoagulated for the next 48 hours.  Therefore, will institute Lovenox as a bridging maneuver beginning March 29th with resumption of Coumadin planned for April 1; Will continue Lovenox until INR therapeutic. This will give [place] the versataility of reversing her anticoagulation if needed in the next several days if she develops a post-polypectomy bleed..  The risks and benefits of anticoagulation in this setting were reviewed with the patient and patient's son at the bedside. Either way, there is no 100% guarantee that an individual will not develop a post polypectomy bleed.  All parties agreeable with plan.   This plan has been discussed with Dr. Aurelio Jew who concurs with it.  At a later date, patient will likely need to return for followup colonoscopy with polypectomy. Will likely offer bridging therapy with Lovenox before and after her next colonoscopy as she will need normal clotting function at the time of her next procedure.  _______________________________ eSigned:  R. Roetta Sessions, MD FACP [facility] 05/03/2012 11:43 AM Revised: 05/03/2012 11:43 AM  CC:    PATIENT NAME:  Helen [name], Helen [name] MR#: [id number]

## 2012-05-03 NOTE — H&P (View-Only) (Signed)
Referring Provider: [place], Kawanta F, MD Primary Care Physician:  [place], KAWANTA, MD Primary Gastroenterologist:  Dr. Rourk  Chief Complaint  Patient presents with  . Diarrhea    HPI:  Helen [name] is a 79 y.o. female here as a referral from Dr.  for 6 month hx of diarrhea.  Taking lomotil 1 per day was working, then increased to BID.  No new medications, no travel, no new pets.  No recent anttibiotics except bactrim as maintenance for UTI.  No diarrhea 1-2 days per week.  Usually a single large explosive stool per day.  She saw bright red blood in her BM.  Denies abdominal pain.    +incontinence of bladder & bowels occasionally.  Denies heartburn, indigestion, nausea, vomiting, dysphagia, odynophagia or anorexia.  Last INR 2.8 last Wed.  On coumadin for chronic a fib, hx CVA & TIAs.    C diff toxin Negative, Lactoferrin positive, iFOBT negative,stool cx negative  Past Medical History  Diagnosis Date  . Stroke   . Atrial fibrillation   . Diabetes mellitus   . Hypertension   . Coronary artery disease   . Hyperlipidemia 05/25/2011  . Anemia 05/25/2011  . Seizures     Noted at [facility] in Jan 2012, started on Keppra  . Depression   . Arthritis     Past Surgical History  Procedure Laterality Date  . Appendectomy    . Abdominal hysterectomy    . Back surgery  1990s  . Colonoscopy  2007    [facility]:  2 polyps-cannot remember?    Current Outpatient Prescriptions  Medication Sig Dispense Refill  . atorvastatin (LIPITOR) 10 MG tablet TAKE 1 TABLET BY MOUTH EVERY DAY AT 6PM  30 tablet  3  . diphenoxylate-atropine (LOMOTIL) 2.5-0.025 MG per tablet Take 1 tablet by mouth 4 (four) times daily as needed for diarrhea or loose stools.  30 tablet  1  . donepezil (ARICEPT) 10 MG tablet TAKE 1 TABLET BY MOUTH DAILY  30 tablet  11  . escitalopram (LEXAPRO) 5 MG tablet TAKE 1 TABLET BY MOUTH EVERY MORNING .  30 tablet  3  . ferrous sulfate 325 (65 FE) MG tablet Take 1 tablet (325 mg total) by  mouth daily with breakfast.      . fesoterodine (TOVIAZ) 8 MG TB24 Take 8 mg by mouth daily.      . folic acid (FOLVITE) 1 MG tablet Take 1 mg by mouth every morning. At 700      . glipiZIDE (GLUCOTROL) 5 MG tablet TAKE 1 TABLET BY MOUTH EVERY DAY  30 tablet  3  . levETIRAcetam (KEPPRA) 500 MG tablet TAKE 1 TABLET BY MOUTH TWICE DAILY  60 tablet  3  . lisinopril (PRINIVIL,ZESTRIL) 10 MG tablet TAKE 1 TABLET BY MOUTH EVERY DAY  30 tablet  3  . metoprolol tartrate (LOPRESSOR) 25 MG tablet TAKE 1/2 TABLET BY MOUTH TWICE DAILY  30 tablet  3  . Multiple Vitamins-Minerals (CENTRUM SILVER ULTRA WOMENS PO) Take 1 tablet by mouth every morning. At 700      . potassium chloride (KLOR-CON 10) 10 MEQ tablet TAKE 2 TABLETS BY MOUTH EVERY DAY  60 tablet  3  . potassium chloride SA (K-DUR,KLOR-CON) 20 MEQ tablet Take 20 mEq by mouth every morning. At 700      . protective barrier (RESTORE) CREA Apply to bottom twice a day  120 g  3  . traMADol (ULTRAM) 50 MG tablet Take 1 tablet (50 mg total) by   mouth 2 (two) times daily as needed for pain.  45 tablet  1  . warfarin (COUMADIN) 7.5 MG tablet Take 4 mg by mouth daily. 4 mg 1 pill 2 time a week then 2 pills 5 times a week      . zolpidem (AMBIEN) 5 MG tablet Take 1 tablet (5 mg total) by mouth at bedtime.  14 tablet  2   No current facility-administered medications for this visit.    Allergies as of 04/23/2012 - Review Complete 04/23/2012  Allergen Reaction Noted  . Ciprofloxacin  05/23/2011  . Niacin and related Hives 05/23/2011  . Statins  07/15/2011    Family History:There is no known family history of colorectal carcinoma , liver disease, or inflammatory bowel disease.  Problem Relation Age of Onset  . Heart disease Father     History   Social History  . Marital Status: Widowed    Spouse Name: N/A    Number of Children: 1  . Years of Education: N/A   Occupational History  . retired, Accounting    Social History Main Topics  . Smoking  status: Never Smoker   . Smokeless tobacco: Not on file  . Alcohol Use: No  . Drug Use: No  . Sexually Active: Not on file   Other Topics Concern  . 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 Resp: Denies dyspnea at rest, dyspnea with exercise, cough, sputum, wheezing, coughing up blood, and pleurisy. GI: Denies vomiting blood, jaundice. GU : Denies urinary burning, blood in urine, urinary frequency, urinary hesitancy, nocturnal urination, and urinary incontinence. MS: Denies joint pain, limitation of movement, and swelling, stiffness, low back pain, extremity pain. Denies muscle weakness, cramps, atrophy.  Derm: Denies rash, itching, dry skin, hives, moles, warts, or unhealing ulcers.  Psych: Denies depression, anxiety, memory loss, suicidal ideation, hallucinations, paranoia, and confusion. Heme: Denies bruising, bleeding, and enlarged lymph nodes. Neuro:  Denies any headaches, dizziness, paresthesias. Endo:  Denies any problems with DM, thyroid, adrenal function.  Physical Exam: BP 157/70  Pulse 58  Temp(Src) 98.5 F (36.9 C) (Oral)  Ht 5' 5" (1.651 m)  Wt 187 lb (84.823 kg)  BMI 31.12 kg/m2 No LMP recorded. Patient has had a hysterectomy. General:   Alert,  Well-developed, well-nourished, pleasant and cooperative in NAD Head:  Normocephalic and atraumatic. Eyes:  Sclera clear, no icterus.   Conjunctiva pink. Ears:  Normal auditory acuity. Nose:  No deformity, discharge, or lesions. Mouth:  No deformity or lesions,oropharynx pink & moist. Neck:  Supple; no masses or thyromegaly. Lungs:  Clear throughout to auscultation.   No wheezes, crackles, or rhonchi. No  acute distress. Heart:  Regular rate and rhythm; no murmurs, clicks, rubs,  or gallops. Abdomen:  Normal bowel sounds.  No bruits.  Soft, non-tender and non-distended without masses, hepatosplenomegaly or hernias noted.  No guarding or rebound tenderness.   Rectal:  Deferred. Msk:  Symmetrical without gross deformities. Normal posture. Pulses:  Normal pulses noted. Extremities:  No clubbing.  +trace lower ext edema bilat.    Neurologic:  Alert and oriented x4;  grossly normal neurologically. Skin:  Intact   without significant lesions or rashes. Lymph Nodes:  No significant cervical adenopathy. Psych:  Alert and cooperative. Normal mood and affect.  

## 2012-05-04 LAB — OVA AND PARASITE EXAMINATION: Ova and parasites: NONE SEEN

## 2012-05-07 ENCOUNTER — Encounter (HOSPITAL_COMMUNITY): Payer: Self-pay | Admitting: Internal Medicine

## 2012-05-08 ENCOUNTER — Inpatient Hospital Stay (HOSPITAL_COMMUNITY)
Admission: EM | Admit: 2012-05-08 | Discharge: 2012-05-11 | DRG: 920 | Disposition: A | Discharge status: Home / Self Care | Payer: Medicare Other | Attending: Family Medicine | Admitting: Family Medicine

## 2012-05-08 ENCOUNTER — Encounter (HOSPITAL_COMMUNITY): Payer: Self-pay | Admitting: Emergency Medicine

## 2012-05-08 DIAGNOSIS — G40909 Epilepsy, unspecified, not intractable, without status epilepticus: Secondary | ICD-10-CM | POA: Diagnosis present

## 2012-05-08 DIAGNOSIS — Y838 Other surgical procedures as the cause of abnormal reaction of the patient, or of later complication, without mention of misadventure at the time of the procedure: Secondary | ICD-10-CM

## 2012-05-08 DIAGNOSIS — IMO0002 Reserved for concepts with insufficient information to code with codable children: Principal | ICD-10-CM | POA: Diagnosis present

## 2012-05-08 DIAGNOSIS — D649 Anemia, unspecified: Secondary | ICD-10-CM | POA: Diagnosis present

## 2012-05-08 DIAGNOSIS — M129 Arthropathy, unspecified: Secondary | ICD-10-CM | POA: Diagnosis present

## 2012-05-08 DIAGNOSIS — F329 Major depressive disorder, single episode, unspecified: Secondary | ICD-10-CM | POA: Diagnosis present

## 2012-05-08 DIAGNOSIS — T45515A Adverse effect of anticoagulants, initial encounter: Secondary | ICD-10-CM | POA: Diagnosis present

## 2012-05-08 DIAGNOSIS — K5730 Diverticulosis of large intestine without perforation or abscess without bleeding: Secondary | ICD-10-CM | POA: Diagnosis present

## 2012-05-08 DIAGNOSIS — F0390 Unspecified dementia without behavioral disturbance: Secondary | ICD-10-CM | POA: Diagnosis present

## 2012-05-08 DIAGNOSIS — Z8673 Personal history of transient ischemic attack (TIA), and cerebral infarction without residual deficits: Secondary | ICD-10-CM

## 2012-05-08 DIAGNOSIS — D62 Acute posthemorrhagic anemia: Secondary | ICD-10-CM

## 2012-05-08 DIAGNOSIS — E119 Type 2 diabetes mellitus without complications: Secondary | ICD-10-CM | POA: Diagnosis present

## 2012-05-08 DIAGNOSIS — K922 Gastrointestinal hemorrhage, unspecified: Secondary | ICD-10-CM | POA: Diagnosis present

## 2012-05-08 DIAGNOSIS — Z66 Do not resuscitate: Secondary | ICD-10-CM | POA: Diagnosis present

## 2012-05-08 DIAGNOSIS — Z79899 Other long term (current) drug therapy: Secondary | ICD-10-CM

## 2012-05-08 DIAGNOSIS — I10 Essential (primary) hypertension: Secondary | ICD-10-CM | POA: Diagnosis present

## 2012-05-08 DIAGNOSIS — I4891 Unspecified atrial fibrillation: Secondary | ICD-10-CM | POA: Diagnosis present

## 2012-05-08 DIAGNOSIS — I480 Paroxysmal atrial fibrillation: Secondary | ICD-10-CM | POA: Diagnosis present

## 2012-05-08 DIAGNOSIS — F3289 Other specified depressive episodes: Secondary | ICD-10-CM | POA: Diagnosis present

## 2012-05-08 DIAGNOSIS — E785 Hyperlipidemia, unspecified: Secondary | ICD-10-CM | POA: Diagnosis present

## 2012-05-08 DIAGNOSIS — D126 Benign neoplasm of colon, unspecified: Secondary | ICD-10-CM | POA: Diagnosis present

## 2012-05-08 DIAGNOSIS — Z7901 Long term (current) use of anticoagulants: Secondary | ICD-10-CM

## 2012-05-08 DIAGNOSIS — I2510 Atherosclerotic heart disease of native coronary artery without angina pectoris: Secondary | ICD-10-CM | POA: Diagnosis present

## 2012-05-08 DIAGNOSIS — Z8249 Family history of ischemic heart disease and other diseases of the circulatory system: Secondary | ICD-10-CM

## 2012-05-08 DIAGNOSIS — D696 Thrombocytopenia, unspecified: Secondary | ICD-10-CM | POA: Diagnosis present

## 2012-05-08 LAB — GLUCOSE, CAPILLARY
Glucose-Capillary: 223 mg/dL — ABNORMAL HIGH (ref 70–99)
Glucose-Capillary: 195 mg/dL — ABNORMAL HIGH (ref 70–99)
Glucose-Capillary: 173 mg/dL — ABNORMAL HIGH (ref 70–99)

## 2012-05-08 LAB — CBC
Hemoglobin: 11.4 g/dL — ABNORMAL LOW (ref 12.0–15.0)
MCHC: 35.1 g/dL (ref 30.0–36.0)
Platelets: 141 10*3/uL — ABNORMAL LOW (ref 150–400)
Platelets: 142 10*3/uL — ABNORMAL LOW (ref 150–400)
RBC: 3.65 MIL/uL — ABNORMAL LOW (ref 3.87–5.11)
RDW: 13.5 % (ref 11.5–15.5)
WBC: 7.6 10*3/uL (ref 4.0–10.5)

## 2012-05-08 LAB — COMPREHENSIVE METABOLIC PANEL
ALT: 17 U/L (ref 0–35)
Alkaline Phosphatase: 57 U/L (ref 39–117)
BUN: 20 mg/dL (ref 6–23)
CO2: 19 mEq/L (ref 19–32)
Chloride: 107 mEq/L (ref 96–112)
GFR calc Af Amer: 65 mL/min — ABNORMAL LOW (ref >90)
Glucose, Bld: 251 mg/dL — ABNORMAL HIGH (ref 70–99)
Potassium: 4.3 mEq/L (ref 3.5–5.1)
Sodium: 136 mEq/L (ref 135–145)
Total Bilirubin: 0.1 mg/dL — ABNORMAL LOW (ref 0.3–1.2)
Total Protein: 5.2 g/dL — ABNORMAL LOW (ref 6.0–8.3)

## 2012-05-08 LAB — ABO/RH: ABO/RH(D): B POS

## 2012-05-08 LAB — POCT I-STAT, CHEM 8
BUN: 22 mg/dL (ref 6–23)
Chloride: 109 mEq/L (ref 96–112)
Creatinine, Ser: 0.9 mg/dL (ref 0.50–1.10)
Potassium: 4.8 mEq/L (ref 3.5–5.1)
Sodium: 137 mEq/L (ref 135–145)
TCO2: 20 mmol/L (ref 0–100)

## 2012-05-08 LAB — CBC WITH DIFFERENTIAL/PLATELET
Basophils Absolute: 0 10*3/uL (ref 0.0–0.1)
Lymphocytes Relative: 23 % (ref 12–46)
Lymphs Abs: 1.5 10*3/uL (ref 0.7–4.0)
Neutro Abs: 4.7 10*3/uL (ref 1.7–7.7)
Platelets: 141 10*3/uL — ABNORMAL LOW (ref 150–400)
RBC: 2.65 MIL/uL — ABNORMAL LOW (ref 3.87–5.11)
RDW: 13 % (ref 11.5–15.5)
WBC: 6.5 10*3/uL (ref 4.0–10.5)

## 2012-05-08 LAB — HEMOGLOBIN A1C
Hgb A1c MFr Bld: 6.5 % — ABNORMAL HIGH (ref <5.7)
Mean Plasma Glucose: 140 mg/dL — ABNORMAL HIGH (ref <117)

## 2012-05-08 LAB — MRSA PCR SCREENING: MRSA by PCR: NEGATIVE

## 2012-05-08 LAB — STOOL CULTURE

## 2012-05-08 LAB — PROTIME-INR: Prothrombin Time: 23.4 seconds — ABNORMAL HIGH (ref 11.6–15.2)

## 2012-05-08 LAB — PREPARE RBC (CROSSMATCH)

## 2012-05-08 MED ORDER — SODIUM CHLORIDE 0.45 % IV SOLN
INTRAVENOUS | Status: DC
  Administered 2012-05-08: 11:00:00 via INTRAVENOUS

## 2012-05-08 MED ORDER — INSULIN ASPART 100 UNIT/ML ~~LOC~~ SOLN: 0.0000 [IU] | Freq: Every day | SUBCUTANEOUS | Status: DC

## 2012-05-08 MED ORDER — PANTOPRAZOLE SODIUM 40 MG IV SOLR
40.0000 mg | Freq: Once | INTRAVENOUS | Status: AC
  Administered 2012-05-08: 40 mg via INTRAVENOUS
  Filled 2012-05-08: qty 40

## 2012-05-08 MED ORDER — INSULIN ASPART 100 UNIT/ML ~~LOC~~ SOLN
0.0000 [IU] | Freq: Three times a day (TID) | SUBCUTANEOUS | Status: DC
  Administered 2012-05-08 – 2012-05-09 (×3): 3 [IU] via SUBCUTANEOUS
  Administered 2012-05-09: 2 [IU] via SUBCUTANEOUS
  Administered 2012-05-09: 8 [IU] via SUBCUTANEOUS
  Administered 2012-05-10 (×2): 3 [IU] via SUBCUTANEOUS
  Administered 2012-05-10: 2 [IU] via SUBCUTANEOUS
  Administered 2012-05-11: 3 [IU] via SUBCUTANEOUS
  Administered 2012-05-11: 5 [IU] via SUBCUTANEOUS

## 2012-05-08 MED ORDER — SODIUM CHLORIDE 0.9 % IV SOLN
1000.0000 mL | Freq: Once | INTRAVENOUS | Status: AC
  Administered 2012-05-08: 1000 mL via INTRAVENOUS

## 2012-05-08 MED ORDER — FUROSEMIDE 10 MG/ML IJ SOLN: 20.0000 mg | Freq: Once | INTRAMUSCULAR | Status: DC

## 2012-05-08 MED ORDER — ACETAMINOPHEN 650 MG RE SUPP: 650.0000 mg | Freq: Four times a day (QID) | RECTAL | Status: DC | PRN

## 2012-05-08 MED ORDER — ATORVASTATIN CALCIUM 10 MG PO TABS
10.0000 mg | ORAL_TABLET | Freq: Every day | ORAL | Status: DC
  Administered 2012-05-08 – 2012-05-10 (×3): 10 mg via ORAL
  Filled 2012-05-08 (×3): qty 1

## 2012-05-08 MED ORDER — FESOTERODINE FUMARATE ER 8 MG PO TB24
8.0000 mg | ORAL_TABLET | Freq: Every day | ORAL | Status: DC
  Administered 2012-05-08 – 2012-05-11 (×4): 8 mg via ORAL
  Filled 2012-05-08 (×7): qty 1

## 2012-05-08 MED ORDER — LEVETIRACETAM 500 MG PO TABS
500.0000 mg | ORAL_TABLET | Freq: Two times a day (BID) | ORAL | Status: DC
  Administered 2012-05-08 – 2012-05-11 (×7): 500 mg via ORAL
  Filled 2012-05-08 (×7): qty 1

## 2012-05-08 MED ORDER — ONDANSETRON HCL 4 MG PO TABS: 4.0000 mg | ORAL_TABLET | Freq: Four times a day (QID) | ORAL | Status: DC | PRN

## 2012-05-08 MED ORDER — DONEPEZIL HCL 5 MG PO TABS
10.0000 mg | ORAL_TABLET | Freq: Every day | ORAL | Status: DC
  Administered 2012-05-08 – 2012-05-10 (×3): 10 mg via ORAL
  Filled 2012-05-08 (×3): qty 2

## 2012-05-08 MED ORDER — CITALOPRAM HYDROBROMIDE 20 MG PO TABS
20.0000 mg | ORAL_TABLET | Freq: Every day | ORAL | Status: DC
  Administered 2012-05-08 – 2012-05-11 (×4): 20 mg via ORAL
  Filled 2012-05-08 (×4): qty 1

## 2012-05-08 MED ORDER — ONDANSETRON HCL 4 MG/2ML IJ SOLN: 4.0000 mg | Freq: Four times a day (QID) | INTRAMUSCULAR | Status: DC | PRN

## 2012-05-08 MED ORDER — SODIUM CHLORIDE 0.9 % IV SOLN
1000.0000 mL | INTRAVENOUS | Status: DC
  Administered 2012-05-08: 1000 mL via INTRAVENOUS

## 2012-05-08 MED ORDER — ACETAMINOPHEN 325 MG PO TABS: 650.0000 mg | ORAL_TABLET | Freq: Four times a day (QID) | ORAL | Status: DC | PRN

## 2012-05-08 MED ORDER — ZOLPIDEM TARTRATE 5 MG PO TABS: 5.0000 mg | ORAL_TABLET | Freq: Every evening | ORAL | Status: DC | PRN

## 2012-05-08 NOTE — H&P (Addendum)
[facility] History and Physical  Helen [name]:[id number] DOB: [birth year] DOA: 05/08/2012  Referring physician: Dr. Bebe Shaggy, EDP PCP: Milinda Antis, MD  Specialists: Dr. Jena Gauss, GI  Chief Complaint: Rectal bleeding  HPI: Helen [name] is a 79 y.o. female presents to the hospital with complaints of rectal bleeding. Patient is on Coumadin for paroxysmal atrial fibrillation. She recently had a colonoscopy done on 3/27 with polypectomy. She resumed her Coumadin with Lovenox bridge. On the morning of admission, she began to have bright red blood per rectum. She is brought to the emergency room where she was noted to be significantly pale. Hemoglobin on admission was 8.6, while prior hemoglobin on 3/19 was 12.7. Patient was hemodynamically stable. she denies any abdominal pain, vomiting. She does feel lightheaded when sitting up from a laying down position. Denies any chest pain or shortness of breath. No dysuria or fever. She's been admitted to the step down unit for closer monitoring.  Review of Systems: Pertinent positives as per history of present illness, otherwise negative  Past Medical History  Diagnosis Date  . Stroke   . Atrial fibrillation   . Diabetes mellitus   . Hypertension   . Coronary artery disease   . Hyperlipidemia 05/25/2011  . Anemia 05/25/2011  . Seizures     Noted at [facility] in Jan 2012, started on Keppra  . Depression   . Arthritis    Past Surgical History  Procedure Laterality Date  . Appendectomy    . Abdominal hysterectomy    . Back surgery  1990s  . Colonoscopy  01/29/2004    [facility]:  rectal polyp-cannot remember?path  . Colonoscopy N/A 05/03/2012    R[facility]: multiple colonic polyps, s/p piecemeal polypectomy, diverticulosis, fragments of tubular adenomas   Social History:  reports that she has never smoked. She does not have any smokeless tobacco history on file. She reports that she does not drink alcohol or use illicit drugs. Ambulates with  the walker  Allergies  Allergen Reactions  . Ciprofloxacin     Patient states that she may have had a stroke due to this medication  . Niacin And Related Hives  . Statins Other (See Comments)    Some but not all     Family History  Problem Relation Age of Onset  . Heart disease Father     Prior to Admission medications   Medication Sig Start Date End Date Taking? Authorizing Provider  acetaminophen (TYLENOL) 500 MG tablet Take 500 mg by mouth 2 (two) times daily.    Historical Provider, MD  atorvastatin (LIPITOR) 10 MG tablet Take 10 mg by mouth daily. 03/28/12   Salley Scarlet, MD  diphenoxylate-atropine (LOMOTIL) 2.5-0.025 MG per tablet Take 1 tablet by mouth 4 (four) times daily as needed for diarrhea or loose stools.    Historical Provider, MD  donepezil (ARICEPT) 10 MG tablet Take 10 mg by mouth at bedtime.    Historical Provider, MD  escitalopram (LEXAPRO) 5 MG tablet Take 5 mg by mouth daily.    Historical Provider, MD  ferrous sulfate 325 (65 FE) MG tablet Take 1 tablet (325 mg total) by mouth daily with breakfast. 05/26/11 05/25/12  Elliot Cousin, MD  fesoterodine (TOVIAZ) 8 MG TB24 Take 8 mg by mouth daily.    Historical Provider, MD  folic acid (FOLVITE) 1 MG tablet Take 1 mg by mouth daily. At 700    Historical Provider, MD  glipiZIDE (GLUCOTROL) 5 MG tablet Take 5 mg by mouth daily.  Historical Provider, MD  levETIRAcetam (KEPPRA) 500 MG tablet Take 500 mg by mouth 2 (two) times daily.    Historical Provider, MD  lisinopril (PRINIVIL,ZESTRIL) 10 MG tablet Take 10 mg by mouth daily.    Historical Provider, MD  metoprolol tartrate (LOPRESSOR) 25 MG tablet Take 12.5 mg by mouth 2 (two) times daily.    Historical Provider, MD  mirabegron ER (MYRBETRIQ) 50 MG TB24 Take 50 mg by mouth daily.    Historical Provider, MD  Multiple Vitamins-Minerals (CENTRUM SILVER ULTRA WOMENS PO) Take 1 tablet by mouth every morning. At 700    Historical Provider, MD  polyethylene  glycol-electrolytes (TRILYTE) 420 G solution Take 4,000 mLs by mouth as directed. 04/23/12   Corbin Ade, MD  potassium chloride (K-DUR) 10 MEQ tablet Take 10 mEq by mouth 2 (two) times daily. 03/28/12   Salley Scarlet, MD  protective barrier (RESTORE) CREA Apply 1 application topically daily.    Historical Provider, MD  sulfamethoxazole-trimethoprim (BACTRIM,SEPTRA) 400-80 MG per tablet Take 1 tablet by mouth daily.    Historical Provider, MD  traMADol (ULTRAM) 50 MG tablet Take 50 mg by mouth 2 (two) times daily as needed for pain. 07/26/11 07/25/12  Salley Scarlet, MD  zolpidem (AMBIEN) 5 MG tablet Take 5 mg by mouth at bedtime as needed for sleep. 11/21/11   Salley Scarlet, MD   Physical Exam: Filed Vitals:   05/08/12 1610 05/08/12 0715 05/08/12 0755 05/08/12 0759  BP: 110/71 121/62  137/61  Pulse: 78 78  82  Temp: 97.7 F (36.5 C) 98.3 F (36.8 C) 97.8 F (36.6 C)   TempSrc:  Oral Oral Oral  Resp: 19 18    Height:    5\' 5"  (1.651 m)  Weight:    85.2 kg (187 lb 13.3 oz)  SpO2:    98%     General:  No acute distress  Eyes: Pupils are equal round react to light  ENT: Mucous membranes are moist  Neck: Supple  Cardiovascular: S1, S2, regular rate and rhythm  Respiratory: Clear to auscultation bilaterally  Abdomen: Soft, nontender, nondistended, sounds are active  Skin: No rashes  Musculoskeletal: Trace pedal edema bilaterally  Psychiatric: Normal affect, cooperative with exam  Neurologic: Grossly intact, nonfocal  Labs on Admission:  Basic Metabolic Panel:  Recent Labs Lab 05/08/12 0459 05/08/12 0514  NA 136 137  K 4.3 4.8  CL 107 109  CO2 19  --   GLUCOSE 251* 260*  BUN 20 22  CREATININE 0.91 0.90  CALCIUM 8.3*  --    Liver Function Tests:  Recent Labs Lab 05/08/12 0459  AST 18  ALT 17  ALKPHOS 57  BILITOT 0.1*  PROT 5.2*  ALBUMIN 2.7*   No results found for this basename: LIPASE, AMYLASE,  in the last 168 hours No results found for  this basename: AMMONIA,  in the last 168 hours CBC:  Recent Labs Lab 05/08/12 0459 05/08/12 0514  WBC 6.5  --   NEUTROABS 4.7  --   HGB 8.6* 9.2*  HCT 24.6* 27.0*  MCV 92.8  --   PLT 141*  --    Cardiac Enzymes: No results found for this basename: CKTOTAL, CKMB, CKMBINDEX, TROPONINI,  in the last 168 hours  BNP (last 3 results) No results found for this basename: PROBNP,  in the last 8760 hours CBG:  Recent Labs Lab 05/08/12 0806  GLUCAP 223*    Radiological Exams on Admission: No results found.  Assessment/Plan Principal Problem:   GI bleed Active Problems:   Dementia   DM type 2 (diabetes mellitus, type 2)   HTN (hypertension)   Paroxysmal atrial fibrillation   Anticoagulant long-term use   Anemia   Seizure disorder   1. GI bleeding in the setting of anticoagulation. Patient's INR is therapeutic on arrival. She's had a significant decline in hemoglobin and is being transfused 2 units of PRBCs. Does not appear that she's had further bleeding since arriving to the hospital. If the patient does continue to bleed, we will give her FFP to reverse coagulopathy. She has been seen by gastroenterology and recommended to be started on clear liquids. We will continue serial hemoglobins. Further workup per GI. 2. Acute blood loss anemia. Continue to transfuse as necessary, follow serial hemoglobins. 3. Paroxysmal atrial fibrillation. Currently in sinus rhythm. Hold anticoagulation in the setting of bleeding. 4. Type 2 diabetes. We'll hold oral hypoglycemics and use sliding scale insulin. 5. Hypertension. We'll hold antihypertensives in the setting of ongoing bleeding. 6. Seizure disorder. Continue by mouth Keppra   Code Status: DO NOT RESUSCITATE Family Communication: Discussed with patient Disposition Plan: To be determined  Time spent:  Whitten Andreoni [facility] Pager [phone]  If 7PM-7AM, please contact night-coverage [email or web address] Password  [facility] 05/08/2012, 9:07 AM  Addendum:  Discussed with Dr. Jena Gauss.  Requested cardiology consult for recommendations on resumption of coumadin.  Since patient had another bloody bowel movement, will transfuse 2 unit FFP.  She is still in the process of completing PRBC transfusions. Will give lasix after FFP

## 2012-05-08 NOTE — Progress Notes (Signed)
CBC obtained this evening before platelets started.

## 2012-05-08 NOTE — ED Notes (Signed)
Patient states she had a colonoscopy this past Thursday and had polyps removed. States has had no problems until 0300 today started having heavy rectal bleeding. Patient has large amounts of blood from rectum at triage.

## 2012-05-08 NOTE — Progress Notes (Signed)
Cardiology consult made. [facility] will be in tomorrow to see patient.

## 2012-05-08 NOTE — ED Provider Notes (Signed)
History     CSN: [id number]  Arrival date & time 05/08/12  0448   First MD Initiated Contact with Patient 05/08/12 0451      Chief Complaint  Patient presents with  . Rectal Bleeding     Patient is a 79 y.o. female presenting with hematochezia. The history is provided by the patient.  Rectal Bleeding  The current episode started today. The onset was sudden. The problem occurs continuously. The problem has been rapidly worsening. The stool is described as bloody. Pertinent negatives include no abdominal pain, no vomiting and no chest pain.  nothing is improving her symptoms.  Nothing worsens her symptoms.  Pt presents from home for rectal bleeding She reports she woke up and sat on edge of bed.  She felt very weak, and then started having rectal bleeding She denies syncope She denies abdominal pain EMS was called and she was transported to the hospital  Past Medical History  Diagnosis Date  . Stroke   . Atrial fibrillation   . Diabetes mellitus   . Hypertension   . Coronary artery disease   . Hyperlipidemia 05/25/2011  . Anemia 05/25/2011  . Seizures     Noted at [facility] in Jan 2012, started on Keppra  . Depression   . Arthritis     Past Surgical History  Procedure Laterality Date  . Appendectomy    . Abdominal hysterectomy    . Back surgery  1990s  . Colonoscopy  01/29/2004    [facility]:  rectal polyp-cannot remember?path  . Colonoscopy N/A 05/03/2012    Procedure: COLONOSCOPY;  Surgeon: Corbin Ade, MD;  Location: [facility];  Service: Endoscopy;  Laterality: N/A;  9:30    Family History  Problem Relation Age of Onset  . Heart disease Father     History  Substance Use Topics  . Smoking status: Never Smoker   . Smokeless tobacco: Not on file  . Alcohol Use: No    OB History   Grav Para Term Preterm Abortions TAB SAB Ect Mult Living                  Review of Systems  Constitutional: Positive for fatigue.  Respiratory: Negative for shortness of  breath.   Cardiovascular: Negative for chest pain.  Gastrointestinal: Positive for blood in stool and hematochezia. Negative for vomiting and abdominal pain.  Musculoskeletal: Negative for back pain.  Skin: Positive for color change and pallor.  Neurological: Positive for weakness.  Psychiatric/Behavioral: Negative for agitation.  All other systems reviewed and are negative.    Allergies  Ciprofloxacin; Niacin and related; and Statins  Home Medications   Current Outpatient Rx  Name  Route  Sig  Dispense  Refill  . acetaminophen (TYLENOL) 500 MG tablet   Oral   Take 500 mg by mouth 2 (two) times daily.         Marland Kitchen atorvastatin (LIPITOR) 10 MG tablet   Oral   Take 10 mg by mouth daily.         . diphenoxylate-atropine (LOMOTIL) 2.5-0.025 MG per tablet   Oral   Take 1 tablet by mouth 4 (four) times daily as needed for diarrhea or loose stools.         . donepezil (ARICEPT) 10 MG tablet   Oral   Take 10 mg by mouth at bedtime.         Marland Kitchen escitalopram (LEXAPRO) 5 MG tablet   Oral   Take 5 mg by mouth  daily.         . ferrous sulfate 325 (65 FE) MG tablet   Oral   Take 1 tablet (325 mg total) by mouth daily with breakfast.         . fesoterodine (TOVIAZ) 8 MG TB24   Oral   Take 8 mg by mouth daily.         . folic acid (FOLVITE) 1 MG tablet   Oral   Take 1 mg by mouth daily. At 700         . glipiZIDE (GLUCOTROL) 5 MG tablet   Oral   Take 5 mg by mouth daily.         Marland Kitchen levETIRAcetam (KEPPRA) 500 MG tablet   Oral   Take 500 mg by mouth 2 (two) times daily.         Marland Kitchen lisinopril (PRINIVIL,ZESTRIL) 10 MG tablet   Oral   Take 10 mg by mouth daily.         . metoprolol tartrate (LOPRESSOR) 25 MG tablet   Oral   Take 12.5 mg by mouth 2 (two) times daily.         . mirabegron ER (MYRBETRIQ) 50 MG TB24   Oral   Take 50 mg by mouth daily.         . Multiple Vitamins-Minerals (CENTRUM SILVER ULTRA WOMENS PO)   Oral   Take 1 tablet by mouth  every morning. At 700         . polyethylene glycol-electrolytes (TRILYTE) 420 G solution   Oral   Take 4,000 mLs by mouth as directed.   4000 mL   0   . potassium chloride (K-DUR) 10 MEQ tablet   Oral   Take 10 mEq by mouth 2 (two) times daily.         . protective barrier (RESTORE) CREA   Topical   Apply 1 application topically daily.         Marland Kitchen sulfamethoxazole-trimethoprim (BACTRIM,SEPTRA) 400-80 MG per tablet   Oral   Take 1 tablet by mouth daily.         . traMADol (ULTRAM) 50 MG tablet   Oral   Take 50 mg by mouth 2 (two) times daily as needed for pain.         Marland Kitchen zolpidem (AMBIEN) 5 MG tablet   Oral   Take 5 mg by mouth at bedtime as needed for sleep.           BP 119/64  Pulse 70  Temp(Src) 98.1 F (36.7 C) (Oral)  Resp 18  Ht 5\' 5"  (1.651 m)  Wt 187 lb (84.823 kg)  BMI 31.12 kg/m2  SpO2 98%  Physical Exam CONSTITUTIONAL: ill appearing HEAD: Normocephalic/atraumatic EYES: EOMI/PERRL ENMT: Mucous membranes moist NECK: supple no meningeal signs Spine - no bruising noted CV:no murmurs/rubs/gallops noted LUNGS: Lungs are clear to auscultation bilaterally, no apparent distress ABDOMEN: soft, nontender, no rebound or guarding Rectal - copious amounts of maroon/bloody stool surrounding rectum as well as in underwear.  Chaperone present NEURO: Pt is awake/alert, moves all extremitiesx4 EXTREMITIES: pulses normal, full ROM SKIN: she is pale in appearance PSYCH: no abnormalities of mood noted  ED Course  Procedures  CRITICAL CARE Performed by: Joya Gaskins   Total critical care time: 37  Critical care time was exclusive of separately billable procedures and treating other patients.  Critical care was necessary to treat or prevent imminent or life-threatening deterioration.  Critical care was time spent personally  by me on the following activities: development of treatment plan with patient and/or surrogate as well as nursing,  discussions with consultants, evaluation of patient's response to treatment, examination of patient, obtaining history from patient or surrogate, ordering and performing treatments and interventions, ordering and review of laboratory studies, ordering and review of radiographic studies, pulse oximetry and re-evaluation of patient's condition.   Labs Reviewed  COMPREHENSIVE METABOLIC PANEL  PROTIME-INR  APTT  CBC WITH DIFFERENTIAL  TYPE AND SCREEN  PREPARE RBC (CROSSMATCH)  PREPARE FRESH FROZEN PLASMA   5:09 AM Pt presents sigificant GI bleed.  I evaluated patient almost immediately upon arrival to the ER.  She is pale in appearance.  However, currently her blood pressure is appropriate.  She had a colonoscopy on 3/27 for chronic diarrhea, and just started her coumadin back this past weekend (2 days after her colonoscopy).  She had polypectomy during c-scope. Pt has given verbal permission to be given blood products if necessary.  I have arranged for FFP and PRBCs for patient in anticipation of coagulopathy as well as anemia.  Will follow closely.  Pt is on coumadin for afib.   5:19 AM Pt without any new complaints HGB at 9 on chem8 panel Will await type/crossed blood for patient.  BP remains stable 5:44 AM Family at bedside.  I updated family on plan to admit to ICU and consult GI and likely need to tranfuse blood SBP has improved with IV.   6:27 AM CBC shows HGB at 8.6 Blood will be transfused once ready Her SBP has remained stable I have spoken to Dr Jena Gauss with GI who is aware of patient I have spoken to Dr Osvaldo Shipper with [facility], will admit to stepdown Per son, pt has been on both lovenox and coumadin - coumadin was restarted on 3/29 Her last dose of lovenox/coumadin was yesterday Of note, pt had transient drop in pulse ox while sleeping but this improves immediately  MDM  Nursing notes including past medical history and social history reviewed and considered in  documentation Previous records reviewed and considered - recent colonoscopy records reviewed, h/o afib and is on coumadin Labs/vital reviewed and considered         Joya Gaskins, MD 05/08/12 [phone]

## 2012-05-08 NOTE — ED Notes (Signed)
Pt turned on her rt side there was no visible signs of bleeding at this time.

## 2012-05-08 NOTE — Progress Notes (Signed)
Physical Therapy Treatment Patient Details Name: MARQUETTA WEISKOPF MRN: [id number] DOB: March 28, 1924 Today's Date: 05/08/2012    PT Assessment / Plan / Recommendation Comments on Treatment Session   Nurse requested therapist to wait until 05/09/2012 to complete evaluation.      Visit Information  Last PT Received On: 05/08/12 Reason Eval/Treat Not Completed: Medical issues which prohibited therapy    Subjective Data  Subjective: Ms. Philyaw states that she lives at home with her son.  She has been wheelchair bound for the past 4-5 months but is able to transfer from her bed to the wheelchair and from the wheelchair to the commode I.  The patient has an aide who comes in five days a week four hours a day.   Patient Stated Goal: To go home   Cognition  Cognition Overall Cognitive Status: Appears within functional limits for tasks assessed/performed Arousal/Alertness: Awake/alert Orientation Level: Appears intact for tasks assessed Behavior During Session: [facility] for tasks performed    Balance     End of Session     GP     Charnel Giles,CINDY 05/08/2012, 1:34 PM

## 2012-05-08 NOTE — ED Notes (Signed)
Pt consented to getting blood. Consent signed and witnessed.

## 2012-05-08 NOTE — ED Notes (Signed)
Pt denies any abd pain at this time on her arrival pt had copious amount of blood and clots in her brief.

## 2012-05-08 NOTE — Consult Note (Addendum)
Primary Care Physician:  Milinda Antis, MD Primary Gastroenterologist:  Dr. Jena Gauss   Date of Admission: 05/08/12 Date of Consultation: 05/08/12  Reason for Consultation:  Rectal bleeding, likely post-polypectomy bleed  HPI:  Ms. Helen [name] is a pleasant 79 year old female who recently underwent a colonoscopy on 05/03/12 with multiple colonic polyps, s/p piecemeal polypectomy, path with fragments of tubular adenomas. Diverticulosis noted. She has a history of afib, requiring anticoagulation. Prior to procedure, she had stopped Coumadin on her own. As her INR was 1.71 on presentation at time of colonoscopy, she was given Lovenox as bridging to help bring INR back to therapeutic range. She states she started Coumadin 12 mg on Saturday (March 29), with last dose Monday. Started Lovenox on Saturday as well.  Woke this morning to use the bathroom, unable to sit up due to weakness, fatigue. States large amount of bright red blood on sheets. "Bed is a disaster". No bleeding per RN since coming to ICU. Denies abdominal pain, N/V. Admitting Hgb 8.6. Admitting INR 2.19. 2 units of PRBCs have been ordered, with 1 unit infusing at time of consultation.   Past Medical History  Diagnosis Date  . Stroke   . Atrial fibrillation   . Diabetes mellitus   . Hypertension   . Coronary artery disease   . Hyperlipidemia 05/25/2011  . Anemia 05/25/2011  . Seizures     Noted at [facility] in Jan 2012, started on Keppra  . Depression   . Arthritis     Past Surgical History  Procedure Laterality Date  . Appendectomy    . Abdominal hysterectomy    . Back surgery  1990s  . Colonoscopy  01/29/2004    [facility]:  rectal polyp-cannot remember?path  . Colonoscopy N/A 05/03/2012    RMR: multiple colonic polyps, s/p piecemeal polypectomy, diverticulosis, fragments of tubular adenomas    Prior to Admission medications   Medication Sig Start Date End Date Taking? Authorizing Provider  acetaminophen (TYLENOL) 500 MG tablet  Take 500 mg by mouth 2 (two) times daily.    Historical Provider, MD  atorvastatin (LIPITOR) 10 MG tablet Take 10 mg by mouth daily. 03/28/12   Salley Scarlet, MD  diphenoxylate-atropine (LOMOTIL) 2.5-0.025 MG per tablet Take 1 tablet by mouth 4 (four) times daily as needed for diarrhea or loose stools.    Historical Provider, MD  donepezil (ARICEPT) 10 MG tablet Take 10 mg by mouth at bedtime.    Historical Provider, MD  escitalopram (LEXAPRO) 5 MG tablet Take 5 mg by mouth daily.    Historical Provider, MD  ferrous sulfate 325 (65 FE) MG tablet Take 1 tablet (325 mg total) by mouth daily with breakfast. 05/26/11 05/25/12  Elliot Cousin, MD  fesoterodine (TOVIAZ) 8 MG TB24 Take 8 mg by mouth daily.    Historical Provider, MD  folic acid (FOLVITE) 1 MG tablet Take 1 mg by mouth daily. At 700    Historical Provider, MD  glipiZIDE (GLUCOTROL) 5 MG tablet Take 5 mg by mouth daily.    Historical Provider, MD  levETIRAcetam (KEPPRA) 500 MG tablet Take 500 mg by mouth 2 (two) times daily.    Historical Provider, MD  lisinopril (PRINIVIL,ZESTRIL) 10 MG tablet Take 10 mg by mouth daily.    Historical Provider, MD  metoprolol tartrate (LOPRESSOR) 25 MG tablet Take 12.5 mg by mouth 2 (two) times daily.    Historical Provider, MD  mirabegron ER (MYRBETRIQ) 50 MG TB24 Take 50 mg by mouth daily.    Historical  Provider, MD  Multiple Vitamins-Minerals (CENTRUM SILVER ULTRA WOMENS PO) Take 1 tablet by mouth every morning. At 700    Historical Provider, MD  polyethylene glycol-electrolytes (TRILYTE) 420 G solution Take 4,000 mLs by mouth as directed. 04/23/12   Corbin Ade, MD  potassium chloride (K-DUR) 10 MEQ tablet Take 10 mEq by mouth 2 (two) times daily. 03/28/12   Salley Scarlet, MD  protective barrier (RESTORE) CREA Apply 1 application topically daily.    Historical Provider, MD  sulfamethoxazole-trimethoprim (BACTRIM,SEPTRA) 400-80 MG per tablet Take 1 tablet by mouth daily.    Historical Provider, MD   traMADol (ULTRAM) 50 MG tablet Take 50 mg by mouth 2 (two) times daily as needed for pain. 07/26/11 07/25/12  Salley Scarlet, MD  zolpidem (AMBIEN) 5 MG tablet Take 5 mg by mouth at bedtime as needed for sleep. 11/21/11   Salley Scarlet, MD    No current facility-administered medications for this encounter.    Allergies as of 05/08/2012 - Review Complete 05/08/2012  Allergen Reaction Noted  . Ciprofloxacin  05/23/2011  . Niacin and related Hives 05/23/2011  . Statins Other (See Comments) 07/15/2011    Family History  Problem Relation Age of Onset  . Heart disease Father     History   Social History  . Marital Status: Widowed    Spouse Name: N/A    Number of Children: 1  . Years of Education: N/A   Occupational History  . retired, Audiological scientist    Social History Main Topics  . Smoking status: Never Smoker   . Smokeless tobacco: Not on file  . Alcohol Use: No  . Drug Use: No  . Sexually Active: Not on file   Other Topics Concern  . 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 Eyes:  Sclera clear, no icterus.   Conjunctiva pink. Ears:  Normal auditory acuity. Nose:  No deformity, discharge,  or lesions. Mouth:  No deformity or lesions, dentition normal. Neck:  Supple; no masses or thyromegaly. Lungs:  Clear throughout to auscultation.   No wheezes, crackles, or rhonchi.  No acute distress. Heart:  S1, S2 present, no murmurs  Abdomen:  Soft, nontender and nondistended. No masses, hepatosplenomegaly or hernias noted. Normal bowel sounds, without guarding, and without rebound.   Rectal:  Deferred   Msk:  Symmetrical without gross deformities. Normal posture. Pulses:  Normal pulses noted. Extremities:  Without clubbing or edema. Neurologic:  Alert and  oriented x4;  grossly normal neurologically. Skin:  Intact without significant lesions or rashes. Cervical Nodes:  No significant cervical adenopathy. Psych:  Alert and cooperative. Normal mood and affect.  Intake/Output from previous day:   Intake/Output this shift: Total I/O In: 500 [I.V.:500] Out: -   Lab Results:  Recent Labs  05/08/12 0459 05/08/12 0514  WBC 6.5  --   HGB 8.6* 9.2*  HCT 24.6* 27.0*  PLT 141*  --    BMET  Recent Labs  05/08/12 0459 05/08/12 0514  NA 136 137  K 4.3 4.8  CL 107 109  CO2 19  --   GLUCOSE 251* 260*  BUN 20 22  CREATININE 0.91 0.90  CALCIUM 8.3*  --    LFT  Recent Labs  05/08/12 0459  PROT 5.2*  ALBUMIN 2.7*  AST 18  ALT 17  ALKPHOS 57  BILITOT 0.1*   PT/INR  Recent Labs  05/08/12 0459  LABPROT 23.4*  INR 2.19*    Impression: 79 year old female with history of afib requiring anticoagulation, recently undergoing colonoscopy secondary to chronic diarrhea, incontinence. Multiple polyps noted, with s/p piecemeal polypectomy noting fragments of tubular adenomas, diverticulosis. Due to her complicated history and INR at time of colonoscopy, her anticoagulation status was discussed by Dr. Jena Gauss with the Cardiologist (outlined clearly in operative note). She was to resume Coumadin on April 1st, start bridging with Lovenox on March 29th, Saturday, with Coumadin to begin April 1st. However, patient and son note she began Coumadin 12 mg on Saturday. She presents now with acute rectal bleeding likely secondary to post-polypectomy bleed in the setting  of anticoagulation. Bleeding has tapered off, and she is receiving 1 unit of PRBCs due to acute blood loss anemia. Due to her complicated history, we will observe clinically for now, start clear liquids, and monitor for any recurrence of rectal bleeding. Her future anticoagulation will need to be discussed in more detail with cardiology.   Plan: Agree with 2units PRBCs Clear liquids Supportive measures Serial H/H Monitor for any changes in status   LOS: 0 days    05/08/2012, 8:04 AM  Pt seen and examined in ICU.  In all likelihood, pt has suffered a post-polypectomy bleed.  Apparently cardiology instructed pt to resume coumadin 4/29- 3 days earlier than originally planned; has been on lovenox per plan (timing of resumption of coumadin likely had nothing to do with bleed, however).  Path on polyp - Adenoma; no evidence of microscopic colitis on biopsies.  I suspect pt will not need another TCS this admit, although pt still has polyps which will need to be addressed at a later date.  The real question is when to re-start anticoagulation. In terms of minimizing risk of recurrent bleeding, I would prefer to wait a good 10-14 days before re-starting coumadin (with no load or bridging therapy); this schedule may need to be tempered by risk of thromboembolic events while off anticoagulation.  I feel it would be a good idea to have cardiology stop by and give [place] their input on resumption of anticoagulation. I appreciate the hospitalist' help with this nice lady. Discussed with Dr. Kerry Hough.

## 2012-05-09 DIAGNOSIS — E119 Type 2 diabetes mellitus without complications: Secondary | ICD-10-CM

## 2012-05-09 DIAGNOSIS — I4891 Unspecified atrial fibrillation: Secondary | ICD-10-CM

## 2012-05-09 DIAGNOSIS — K922 Gastrointestinal hemorrhage, unspecified: Secondary | ICD-10-CM

## 2012-05-09 DIAGNOSIS — Z7901 Long term (current) use of anticoagulants: Secondary | ICD-10-CM

## 2012-05-09 DIAGNOSIS — D62 Acute posthemorrhagic anemia: Secondary | ICD-10-CM

## 2012-05-09 LAB — PREPARE FRESH FROZEN PLASMA: Unit division: 0

## 2012-05-09 LAB — TYPE AND SCREEN
ABO/RH(D): B POS
Unit division: 0
Unit division: 0

## 2012-05-09 LAB — GLUCOSE, CAPILLARY
Glucose-Capillary: 177 mg/dL — ABNORMAL HIGH (ref 70–99)
Glucose-Capillary: 260 mg/dL — ABNORMAL HIGH (ref 70–99)
Glucose-Capillary: 142 mg/dL — ABNORMAL HIGH (ref 70–99)

## 2012-05-09 LAB — CBC
HCT: 27 % — ABNORMAL LOW (ref 36.0–46.0)
Hemoglobin: 10.1 g/dL — ABNORMAL LOW (ref 12.0–15.0)
Hemoglobin: 11.4 g/dL — ABNORMAL LOW (ref 12.0–15.0)
Hemoglobin: 9.6 g/dL — ABNORMAL LOW (ref 12.0–15.0)
MCH: 31 pg (ref 26.0–34.0)
MCH: 31.4 pg (ref 26.0–34.0)
MCH: 31.4 pg (ref 26.0–34.0)
MCV: 88.2 fL (ref 78.0–100.0)
RBC: 3.06 MIL/uL — ABNORMAL LOW (ref 3.87–5.11)
RBC: 3.26 MIL/uL — ABNORMAL LOW (ref 3.87–5.11)
RBC: 3.63 MIL/uL — ABNORMAL LOW (ref 3.87–5.11)

## 2012-05-09 LAB — BASIC METABOLIC PANEL
CO2: 22 mEq/L (ref 19–32)
GFR calc non Af Amer: 76 mL/min — ABNORMAL LOW (ref >90)
Glucose, Bld: 156 mg/dL — ABNORMAL HIGH (ref 70–99)
Potassium: 3.8 mEq/L (ref 3.5–5.1)
Sodium: 139 mEq/L (ref 135–145)

## 2012-05-09 MED ORDER — LISINOPRIL 10 MG PO TABS
10.0000 mg | ORAL_TABLET | Freq: Every day | ORAL | Status: DC
  Administered 2012-05-09 – 2012-05-11 (×3): 10 mg via ORAL
  Filled 2012-05-09 (×3): qty 1

## 2012-05-09 MED ORDER — METOPROLOL TARTRATE 25 MG PO TABS
12.5000 mg | ORAL_TABLET | Freq: Two times a day (BID) | ORAL | Status: DC
  Administered 2012-05-09 – 2012-05-11 (×5): 12.5 mg via ORAL
  Filled 2012-05-09 (×5): qty 1

## 2012-05-09 NOTE — Consult Note (Signed)
[facility]       CONSULTATION NOTE   Reason for Consult: Acute lower gastrointestinal bleeding in the setting of permanent atrial fibrillation and recent cardioembolic stroke  Requesting Physician: Irene Limbo  Cardiologist: Kafi Dotter  HPI: This is a 79 y.o. female with a past medical history significant for an extensive right frontal lobe hemispheric stroke that occurred roughly one year ago and was felt to be related to atrial fibrillation. On March 27 she underwent colonoscopy and was found to have left-sided diverticulosis as well as multiple sessile polyps in the ascending colon including one that was as large as 22.5 cm in maximum dimension. Anticoagulations have been held prior to the procedure and was held for another 48 hours afterwards. Subsequently Lovenox and warfarin were restarted but on April 1, after receiving a total of 4 doses of subcutaneous enoxaparin she presented with active bright red blood rectal bleeding and marked anemia with a hemoglobin of 8.6. The INR was not hyper therapeutic. In fact the INR was still well within the desirable range of 2.2.    Anticoagulants were held and she received 2 units of packed red blood cells and 2 units of fresh frozen plasma transfusion. She continues to have blood in her stool although the color has changed to a darker maroon rather than the bright red on admission.  She has not had any new  neurological deficits while here in the hospital. An electrocardiogram has not been performed but the monitor shows a sinus rhythm with a rate of approximately 70 beats per minutes. She has not shown evidence of any significant hemodynamic instability.  Dr. Jena Gauss has suggested that we hold anticoagulants for about 10-14 days without a warfarin load or any bridging therapy.  PMHx:  Past Medical History  Diagnosis Date  . Stroke   . Atrial fibrillation   . Diabetes mellitus   . Hypertension   . Coronary artery  disease   . Hyperlipidemia 05/25/2011  . Anemia 05/25/2011  . Seizures     Noted at [facility] in Jan 2012, started on Keppra  . Depression   . Arthritis    Past Surgical History  Procedure Laterality Date  . Appendectomy    . Abdominal hysterectomy    . Back surgery  1990s  . Colonoscopy  01/29/2004    [facility]:  rectal polyp-cannot remember?path  . Colonoscopy N/A 05/03/2012    RMR: multiple colonic polyps, s/p piecemeal polypectomy, diverticulosis, fragments of tubular adenomas    FAMHx: Family History  Problem Relation Age of Onset  . Heart disease Father     SOCHx:  reports that she has never smoked. She does not have any smokeless tobacco history on file. She reports that she does not drink alcohol or use illicit drugs.  ALLERGIES: Allergies  Allergen Reactions  . Ciprofloxacin     Patient states that she may have had a stroke due to this medication  . Niacin And Related Hives  . Statins Other (See Comments)    Some but not all     ROS: Eyes: negative Ears, nose, mouth, throat, and face: negative for epistaxis, hoarseness and sore throat Respiratory: positive for  , negative for cough, dyspnea on exertion, hemoptysis, sputum and wheezing Cardiovascular: negative for chest pain, fatigue, irregular heart beat, lower extremity edema, palpitations and syncope Gastrointestinal: positive for Hematochezia, but negative for nausea vomiting dysphagia or abdominal pain Genitourinary:negative for dysuria and hematuria Hematologic/lymphatic: positive for bleeding, negative for easy  bruising Musculoskeletal:negative Neurological: negative, Continues to walk with a walker, no new neurological deficits Behavioral/Psych: negative  HOME MEDICATIONS: Prescriptions prior to admission  Medication Sig Dispense Refill  . acetaminophen (TYLENOL) 500 MG tablet Take 500 mg by mouth 2 (two) times daily.      Marland Kitchen atorvastatin (LIPITOR) 10 MG tablet Take 10 mg by mouth daily.      .  diphenoxylate-atropine (LOMOTIL) 2.5-0.025 MG per tablet Take 1 tablet by mouth 4 (four) times daily as needed for diarrhea or loose stools.      . donepezil (ARICEPT) 10 MG tablet Take 10 mg by mouth at bedtime.      . enoxaparin (LOVENOX) 80 MG/0.8ML injection Inject 80 mg into the skin every 12 (twelve) hours.      Marland Kitchen escitalopram (LEXAPRO) 5 MG tablet Take 5 mg by mouth daily.      . ferrous sulfate 325 (65 FE) MG tablet Take 1 tablet (325 mg total) by mouth daily with breakfast.      . fesoterodine (TOVIAZ) 8 MG TB24 Take 8 mg by mouth daily.      . folic acid (FOLVITE) 1 MG tablet Take 1 mg by mouth daily. At 700      . glipiZIDE (GLUCOTROL) 5 MG tablet Take 5 mg by mouth daily.      Marland Kitchen levETIRAcetam (KEPPRA) 500 MG tablet Take 500 mg by mouth 2 (two) times daily.      Marland Kitchen lisinopril (PRINIVIL,ZESTRIL) 10 MG tablet Take 10 mg by mouth daily.      . metoprolol tartrate (LOPRESSOR) 25 MG tablet Take 12.5 mg by mouth 2 (two) times daily.      . mirabegron ER (MYRBETRIQ) 50 MG TB24 Take 50 mg by mouth daily.      . Multiple Vitamins-Minerals (CENTRUM SILVER ULTRA WOMENS PO) Take 1 tablet by mouth every morning. At 700      . potassium chloride (K-DUR) 10 MEQ tablet Take 10 mEq by mouth 2 (two) times daily.      . protective barrier (RESTORE) CREA Apply 1 application topically daily.      Marland Kitchen sulfamethoxazole-trimethoprim (BACTRIM,SEPTRA) 400-80 MG per tablet Take 1 tablet by mouth daily.      . traMADol (ULTRAM) 50 MG tablet Take 50 mg by mouth 2 (two) times daily as needed for pain.      Marland Kitchen warfarin (COUMADIN) 4 MG tablet Take 4-8 mg by mouth daily. Pt usually takes 8mg  daily except 4mg  on Wed, Sun.  After procedure she had been instructed to take 12mg  daily x 3 days which she did Mar 29-31.      Marland Kitchen zolpidem (AMBIEN) 5 MG tablet Take 5 mg by mouth at bedtime as needed for sleep.        HOSPITAL MEDICATIONS: Prior to Admission:  Prescriptions prior to admission  Medication Sig Dispense Refill  .  acetaminophen (TYLENOL) 500 MG tablet Take 500 mg by mouth 2 (two) times daily.      Marland Kitchen atorvastatin (LIPITOR) 10 MG tablet Take 10 mg by mouth daily.      . diphenoxylate-atropine (LOMOTIL) 2.5-0.025 MG per tablet Take 1 tablet by mouth 4 (four) times daily as needed for diarrhea or loose stools.      . donepezil (ARICEPT) 10 MG tablet Take 10 mg by mouth at bedtime.      . enoxaparin (LOVENOX) 80 MG/0.8ML injection Inject 80 mg into the skin every 12 (twelve) hours.      Marland Kitchen escitalopram (LEXAPRO) 5  MG tablet Take 5 mg by mouth daily.      . ferrous sulfate 325 (65 FE) MG tablet Take 1 tablet (325 mg total) by mouth daily with breakfast.      . fesoterodine (TOVIAZ) 8 MG TB24 Take 8 mg by mouth daily.      . folic acid (FOLVITE) 1 MG tablet Take 1 mg by mouth daily. At 700      . glipiZIDE (GLUCOTROL) 5 MG tablet Take 5 mg by mouth daily.      Marland Kitchen levETIRAcetam (KEPPRA) 500 MG tablet Take 500 mg by mouth 2 (two) times daily.      Marland Kitchen lisinopril (PRINIVIL,ZESTRIL) 10 MG tablet Take 10 mg by mouth daily.      . metoprolol tartrate (LOPRESSOR) 25 MG tablet Take 12.5 mg by mouth 2 (two) times daily.      . mirabegron ER (MYRBETRIQ) 50 MG TB24 Take 50 mg by mouth daily.      . Multiple Vitamins-Minerals (CENTRUM SILVER ULTRA WOMENS PO) Take 1 tablet by mouth every morning. At 700      . potassium chloride (K-DUR) 10 MEQ tablet Take 10 mEq by mouth 2 (two) times daily.      . protective barrier (RESTORE) CREA Apply 1 application topically daily.      Marland Kitchen sulfamethoxazole-trimethoprim (BACTRIM,SEPTRA) 400-80 MG per tablet Take 1 tablet by mouth daily.      . traMADol (ULTRAM) 50 MG tablet Take 50 mg by mouth 2 (two) times daily as needed for pain.      Marland Kitchen warfarin (COUMADIN) 4 MG tablet Take 4-8 mg by mouth daily. Pt usually takes 8mg  daily except 4mg  on Wed, Sun.  After procedure she had been instructed to take 12mg  daily x 3 days which she did Mar 29-31.      Marland Kitchen zolpidem (AMBIEN) 5 MG tablet Take 5 mg by mouth  at bedtime as needed for sleep.        VITALS: Blood pressure 151/68, pulse 89, temperature 98.7 F (37.1 C), temperature source Oral, resp. rate 19, height 5\' 5"  (1.651 m), weight 85.2 kg (187 lb 13.3 oz), SpO2 98.00%.  PHYSICAL EXAM: General appearance: alert, cooperative, no distress and Lying fully supine in bed Neck: no adenopathy, no carotid bruit, no JVD, supple, symmetrical, trachea midline and thyroid not enlarged, symmetric, no tenderness/mass/nodules Lungs: clear to auscultation bilaterally Heart: regular rate and rhythm, S1, S2 normal, no murmur, click, rub or gallop Abdomen: soft, non-tender; bowel sounds normal; no masses,  no organomegaly Extremities: extremities normal, atraumatic, no cyanosis or edema Pulses: 2+ and symmetric Skin: Skin color, texture, turgor normal. No rashes or lesions, mobility and turgor normal and no edema or She is pale Neurologic: Gait: Not tested;  LABS: Results for orders placed during the hospital encounter of 05/08/12 (from the past 48 hour(s))  TYPE AND SCREEN     Status: None   Collection Time    05/08/12  4:58 AM      Result Value Range   ABO/RH(D) B POS     Antibody Screen NEG     Sample Expiration 05/11/2012     Unit Number [id number]     Blood Component Type RED CELLS,LR     Unit division 00     Status of Unit ISSUED,FINAL     Transfusion Status OK TO TRANSFUSE     Crossmatch Result Compatible     Unit Number [id number]     Blood Component Type RED CELLS,LR  Unit division 00     Status of Unit ISSUED,FINAL     Transfusion Status OK TO TRANSFUSE     Crossmatch Result Compatible    COMPREHENSIVE METABOLIC PANEL     Status: Abnormal   Collection Time    05/08/12  4:59 AM      Result Value Range   Sodium 136  135 - 145 mEq/L   Potassium 4.3  3.5 - 5.1 mEq/L   Chloride 107  96 - 112 mEq/L   CO2 19  19 - 32 mEq/L   Glucose, Bld 251 (*) 70 - 99 mg/dL   BUN 20  6 - 23 mg/dL   Creatinine, Ser 1.88  0.50 - 1.10 mg/dL    Calcium 8.3 (*) 8.4 - 10.5 mg/dL   Total Protein 5.2 (*) 6.0 - 8.3 g/dL   Albumin 2.7 (*) 3.5 - 5.2 g/dL   AST 18  0 - 37 U/L   ALT 17  0 - 35 U/L   Alkaline Phosphatase 57  39 - 117 U/L   Total Bilirubin 0.1 (*) 0.3 - 1.2 mg/dL   GFR calc non Af Amer 56 (*) >90 mL/min   GFR calc Af Amer 65 (*) >90 mL/min   Comment:            The eGFR has been calculated     using the CKD EPI equation.     This calculation has not been     validated in all clinical     situations.     eGFR's persistently     <90 mL/min signify     possible Chronic Kidney Disease.  PROTIME-INR     Status: Abnormal   Collection Time    05/08/12  4:59 AM      Result Value Range   Prothrombin Time 23.4 (*) 11.6 - 15.2 seconds   INR 2.19 (*) 0.00 - 1.49  APTT     Status: Abnormal   Collection Time    05/08/12  4:59 AM      Result Value Range   aPTT 41 (*) 24 - 37 seconds   Comment:            IF BASELINE aPTT IS ELEVATED,     SUGGEST PATIENT RISK ASSESSMENT     BE USED TO DETERMINE APPROPRIATE     ANTICOAGULANT THERAPY.  CBC WITH DIFFERENTIAL     Status: Abnormal   Collection Time    05/08/12  4:59 AM      Result Value Range   WBC 6.5  4.0 - 10.5 K/uL   RBC 2.65 (*) 3.87 - 5.11 MIL/uL   Hemoglobin 8.6 (*) 12.0 - 15.0 g/dL   HCT 41.6 (*) 60.6 - 30.1 %   MCV 92.8  78.0 - 100.0 fL   MCH 32.5  26.0 - 34.0 pg   MCHC 35.0  30.0 - 36.0 g/dL   RDW 60.1  09.3 - 23.5 %   Platelets 141 (*) 150 - 400 K/uL   Neutrophils Relative 72  43 - 77 %   Neutro Abs 4.7  1.7 - 7.7 K/uL   Lymphocytes Relative 23  12 - 46 %   Lymphs Abs 1.5  0.7 - 4.0 K/uL   Monocytes Relative 4  3 - 12 %   Monocytes Absolute 0.3  0.1 - 1.0 K/uL   Eosinophils Relative 1  0 - 5 %   Eosinophils Absolute 0.0  0.0 - 0.7 K/uL   Basophils Relative 1  0 - 1 %   Basophils Absolute 0.0  0.0 - 0.1 K/uL  PREPARE RBC (CROSSMATCH)     Status: None   Collection Time    05/08/12  5:01 AM      Result Value Range   Order Confirmation ORDER PROCESSED  BY BLOOD BANK    PREPARE FRESH FROZEN PLASMA     Status: None   Collection Time    05/08/12  5:02 AM      Result Value Range   Unit Number [id number]     Blood Component Type THAWED PLASMA     Unit division 00     Status of Unit ISSUED,FINAL     Transfusion Status OK TO TRANSFUSE     Unit Number [id number]     Blood Component Type THAWED PLASMA     Unit division 00     Status of Unit ISSUED,FINAL     Transfusion Status OK TO TRANSFUSE    POCT I-STAT, CHEM 8     Status: Abnormal   Collection Time    05/08/12  5:14 AM      Result Value Range   Sodium 137  135 - 145 mEq/L   Potassium 4.8  3.5 - 5.1 mEq/L   Chloride 109  96 - 112 mEq/L   BUN 22  6 - 23 mg/dL   Creatinine, Ser 4.40  0.50 - 1.10 mg/dL   Glucose, Bld 102 (*) 70 - 99 mg/dL   Calcium, Ion 7.25  3.66 - 1.30 mmol/L   TCO2 20  0 - 100 mmol/L   Hemoglobin 9.2 (*) 12.0 - 15.0 g/dL   HCT 44.0 (*) 34.7 - 42.5 %  ABO/RH     Status: None   Collection Time    05/08/12  5:15 AM      Result Value Range   ABO/RH(D) B POS    MRSA PCR SCREENING     Status: None   Collection Time    05/08/12  7:55 AM      Result Value Range   MRSA by PCR NEGATIVE  NEGATIVE   Comment:            The GeneXpert MRSA Assay (FDA     approved for NASAL specimens     only), is one component of a     comprehensive MRSA colonization     surveillance program. It is not     intended to diagnose MRSA     infection nor to guide or     monitor treatment for     MRSA infections.  GLUCOSE, CAPILLARY     Status: Abnormal   Collection Time    05/08/12  8:06 AM      Result Value Range   Glucose-Capillary 223 (*) 70 - 99 mg/dL  HEMOGLOBIN Z5G     Status: Abnormal   Collection Time    05/08/12 11:07 AM      Result Value Range   Hemoglobin A1C 6.5 (*) <5.7 %   Comment: (NOTE)                                                                               According to the ADA  Clinical Practice Recommendations for 2011, when     HbA1c is used as a  screening test:      >=6.5%   Diagnostic of Diabetes Mellitus               (if abnormal result is confirmed)     5.7-6.4%   Increased risk of developing Diabetes Mellitus     References:Diagnosis and Classification of Diabetes Mellitus,Diabetes     Care,2011,34(Suppl 1):S62-S69 and Standards of Medical Care in             Diabetes - 2011,Diabetes Care,2011,34 (Suppl 1):S11-S61.   Mean Plasma Glucose 140 (*) <117 mg/dL  CBC     Status: Abnormal   Collection Time    05/08/12 11:07 AM      Result Value Range   WBC 7.6  4.0 - 10.5 K/uL   RBC 2.87 (*) 3.87 - 5.11 MIL/uL   Hemoglobin 9.2 (*) 12.0 - 15.0 g/dL   HCT 28.4 (*) 13.2 - 44.0 %   MCV 91.3  78.0 - 100.0 fL   MCH 32.1  26.0 - 34.0 pg   MCHC 35.1  30.0 - 36.0 g/dL   RDW 10.2  72.5 - 36.6 %   Platelets 141 (*) 150 - 400 K/uL  GLUCOSE, CAPILLARY     Status: Abnormal   Collection Time    05/08/12 11:48 AM      Result Value Range   Glucose-Capillary 195 (*) 70 - 99 mg/dL  GLUCOSE, CAPILLARY     Status: Abnormal   Collection Time    05/08/12  4:02 PM      Result Value Range   Glucose-Capillary 136 (*) 70 - 99 mg/dL  CBC     Status: Abnormal   Collection Time    05/08/12  4:57 PM      Result Value Range   WBC 9.1  4.0 - 10.5 K/uL   RBC 3.65 (*) 3.87 - 5.11 MIL/uL   Hemoglobin 11.4 (*) 12.0 - 15.0 g/dL   Comment: DELTA CHECK NOTED   HCT 32.4 (*) 36.0 - 46.0 %   MCV 88.8  78.0 - 100.0 fL   MCH 31.2  26.0 - 34.0 pg   MCHC 35.2  30.0 - 36.0 g/dL   RDW 44.0  34.7 - 42.5 %   Platelets 142 (*) 150 - 400 K/uL  GLUCOSE, CAPILLARY     Status: Abnormal   Collection Time    05/08/12  9:33 PM      Result Value Range   Glucose-Capillary 173 (*) 70 - 99 mg/dL   Comment 1 Notify RN    CBC     Status: Abnormal   Collection Time    05/09/12 12:38 AM      Result Value Range   WBC 7.0  4.0 - 10.5 K/uL   RBC 3.06 (*) 3.87 - 5.11 MIL/uL   Hemoglobin 9.6 (*) 12.0 - 15.0 g/dL   HCT 95.6 (*) 38.7 - 56.4 %   MCV 88.2  78.0 - 100.0 fL    MCH 31.4  26.0 - 34.0 pg   MCHC 35.6  30.0 - 36.0 g/dL   RDW 33.2  95.1 - 88.4 %   Platelets 127 (*) 150 - 400 K/uL  PROTIME-INR     Status: Abnormal   Collection Time    05/09/12  4:57 AM      Result Value Range   Prothrombin Time 19.2 (*) 11.6 - 15.2 seconds   INR 1.68 (*)  0.00 - 1.49  BASIC METABOLIC PANEL     Status: Abnormal   Collection Time    05/09/12  4:57 AM      Result Value Range   Sodium 139  135 - 145 mEq/L   Potassium 3.8  3.5 - 5.1 mEq/L   Comment: DELTA CHECK NOTED   Chloride 106  96 - 112 mEq/L   CO2 22  19 - 32 mEq/L   Glucose, Bld 156 (*) 70 - 99 mg/dL   BUN 11  6 - 23 mg/dL   Creatinine, Ser 4.09  0.50 - 1.10 mg/dL   Calcium 8.8  8.4 - 81.1 mg/dL   GFR calc non Af Amer 76 (*) >90 mL/min   GFR calc Af Amer 88 (*) >90 mL/min   Comment:            The eGFR has been calculated     using the CKD EPI equation.     This calculation has not been     validated in all clinical     situations.     eGFR's persistently     <90 mL/min signify     possible Chronic Kidney Disease.  GLUCOSE, CAPILLARY     Status: Abnormal   Collection Time    05/09/12  8:16 AM      Result Value Range   Glucose-Capillary 177 (*) 70 - 99 mg/dL   Comment 1 Documented in Chart     Comment 2 Notify RN    CBC     Status: Abnormal   Collection Time    05/09/12  8:40 AM      Result Value Range   WBC 12.3 (*) 4.0 - 10.5 K/uL   RBC 3.63 (*) 3.87 - 5.11 MIL/uL   Hemoglobin 11.4 (*) 12.0 - 15.0 g/dL   HCT 91.4 (*) 78.2 - 95.6 %   MCV 88.7  78.0 - 100.0 fL   MCH 31.4  26.0 - 34.0 pg   MCHC 35.4  30.0 - 36.0 g/dL   RDW 21.3  08.6 - 57.8 %   Platelets 150  150 - 400 K/uL  GLUCOSE, CAPILLARY     Status: Abnormal   Collection Time    05/09/12 11:23 AM      Result Value Range   Glucose-Capillary 260 (*) 70 - 99 mg/dL   Comment 1 Notify RN     Comment 2 Documented in Chart      IMAGING: No results found.  IMPRESSION: 1. Acute lower gastrointestinal bleeding post polypectomy in the  setting of anticoagulation with enoxaparin and recent reinitiation of warfarin. I suspect that the enoxaparin was the culprit as is often the case when we use bridging. Unfortunately she has a higher than average risk of cardioembolic events. The fact that she's currently in sinus rhythm is somewhat reassuring.  RECOMMENDATION:  I agree with Dr. Jena Gauss that it is best to withhold all anticoagulation for about 10 days and then reintroduced warfarin without enoxaparin bridging and without a "load".  Time Spent Directly with Patient: 30 minutes  Thurmon Fair, MD, [facility] [phone] office [phone] pager  Mallika Sanmiguel 05/09/2012, 1:22 PM

## 2012-05-09 NOTE — Care Management Note (Signed)
    Page 1 of 1   05/11/2012     11:40:55 AM   CARE MANAGEMENT NOTE 05/11/2012  Patient:  MILLY, GOGGINS   Account Number:  [id number]  Date Initiated:  05/09/2012  Documentation initiated by:  Rosemary Holms  Subjective/Objective Assessment:   Pt admitted from home where she lives with her son Christen Bame. Pt states a [employer] come qd and Francisco (in room) is the caregiver at night. Has all equipment needed. No other needs noted.     Action/Plan:   Anticipated DC Date:  05/11/2012   Anticipated DC Plan:  HOME/SELF CARE      DC Planning Services  CM consult      Choice offered to / List presented to:             Status of service:  Completed, signed off Medicare Important Message given?  YES (If response is "NO", the following Medicare IM given date fields will be blank) Date Medicare IM given:  05/11/2012 Date Additional Medicare IM given:    Discharge Disposition:  HOME/SELF CARE  Per UR Regulation:    If discussed at Long Length of Stay Meetings, dates discussed:    Comments:  05/09/12 Rosemary Holms RN BSN CM

## 2012-05-09 NOTE — Plan of Care (Signed)
Problem: Phase I Progression Outcomes Goal: OOB as tolerated unless otherwise ordered Outcome: Not Progressing Pt is staying in the bed at this time, her preference, states that she stays in a wheel chair at home, she has too much weakness, will transition to days to try to encourage her to let PT work with her

## 2012-05-09 NOTE — Progress Notes (Signed)
Notified Dr. Brendia Sacks that pt had a large maroon colored (Loose) bowel mvmt. He ordered clear liquid diet.

## 2012-05-09 NOTE — Progress Notes (Signed)
Subjective: Small amount of dark blood earlier this morning. No abdominal pain. Received 2 units PRBCs yesterday, completed 2 units FFP yesterday evening. [facility] to see today.   Objective: Vital signs in last 24 hours: Temp:  [97.7 F (36.5 C)-98.6 F (37 C)] 98.1 F (36.7 C) (04/02 0000) Pulse Rate:  [82] 82 (04/01 0759) Resp:  [11-22] 19 (04/02 0600) BP: (92-174)/(58-110) 170/85 mmHg (04/02 0600) SpO2:  [98 %] 98 % (04/01 0759) Weight:  [187 lb 13.3 oz (85.2 kg)] 187 lb 13.3 oz (85.2 kg) (04/01 0759) Last BM Date: 05/08/12 General:   Alert and oriented, pleasant Head:  Normocephalic and atraumatic. Eyes:  No icterus, sclera clear. Conjuctiva pink.  Mouth:  Without lesions, mucosa pink and moist.  Heart:  S1, S2 present, irregular Lungs: Clear to auscultation bilaterally, without wheezing, rales, or rhonchi.  Abdomen:  Bowel sounds present, soft, non-tender, non-distended.  Msk:  Symmetrical without gross deformities. Normal posture. Extremities:  Without clubbing or edema. Neurologic:  Alert and  oriented x4;  grossly normal neurologically. Skin:  Warm and dry, intact without significant lesions.  Psych:  Alert and cooperative. Normal mood and affect.  Intake/Output from previous day: 04/01 0701 - 04/02 0700 In: 3948.8 [P.O.:600; I.V.:1921.3; Blood:1427.5] Out: -  Intake/Output this shift:    Lab Results:  Recent Labs  05/08/12 1107 05/08/12 1657 05/09/12 0038  WBC 7.6 9.1 7.0  HGB 9.2* 11.4* 9.6*  HCT 26.2* 32.4* 27.0*  PLT 141* 142* 127*   BMET  Recent Labs  05/08/12 0459 05/08/12 0514 05/09/12 0457  NA 136 137 139  K 4.3 4.8 3.8  CL 107 109 106  CO2 19  --  22  GLUCOSE 251* 260* 156*  BUN 20 22 11   CREATININE 0.91 0.90 0.73  CALCIUM 8.3*  --  8.8   LFT  Recent Labs  05/08/12 0459  PROT 5.2*  ALBUMIN 2.7*  AST 18  ALT 17  ALKPHOS 57  BILITOT 0.1*   PT/INR  Recent Labs  05/08/12 0459 05/09/12 0457  LABPROT 23.4*  19.2*  INR 2.19* 1.68*    Assessment: 79 year old female with history of afib requiring anticoagulation, admitted for likely post-polypectomy bleed. Received 2 units PRBCs yesterday and 2 units FFP yesterday evening, this morning Hgb 9.6, drifting from 11.4 yesterday afternoon. Small BM early this morning with dark blood. Likely decrease in Hgb multifactorial. Recheck today at noon. Continue to provide supportive measures, await cardiology input.      Plan: Preferably wait 10-14 days before restarting Coumadin: appreciate cardiology assistance upcoming Monitor H/H: recheck today at noon.  Will need TCS at some point in future due to remaining polyps.  Supportive measures Continue clear liquids for now   LOS: 1 day    05/09/2012, 7:57 AM    Addendum: Advance diet to fulls for lunch. Likely regular diet for dinner.  Nira Retort, ANP-BC [facility]   Agree with above assessment and recommendations. I have reviewed cardiology's input and agree with time table of restarting Coumadin. Specifically, as long as she does well clinically in the coming days, I would recommend resumption of Coumadin (without a loading dose) and with no further bridging therapy on April 10. We'll arrange outpatient followup in our office in the coming weeks.

## 2012-05-09 NOTE — Progress Notes (Signed)
UR Chart Review Completed  

## 2012-05-09 NOTE — Evaluation (Signed)
Physical Therapy Evaluation Patient Details Name: Helen [name] MRN: [id number] DOB: 09-Jan-1925 Today's Date: 05/09/2012 Time: 4098-1191 PT Time Calculation (min): 19 min  PT Assessment / Plan / Recommendation Clinical Impression  PT is an 79 yo female who is normally non-ambulatory who will benefit from skilled care to progress pt to this previous level of function.  Pt function was significantly decreased prior to admission therefore pt should not need further therapy services after discharge.     PT Assessment  Patient needs continued PT services    Follow Up Recommendations  No PT follow up    Does the patient have the potential to tolerate intense rehabilitation    no  Barriers to Discharge None (Pt has help in AM and PM)      Equipment Recommendations  None recommended by PT    Recommendations for Other Services   none  Frequency Min 3X/week    Precautions / Restrictions Precautions Precautions: Fall Restrictions Weight Bearing Restrictions: No         obility  Bed Mobility Bed Mobility: Rolling Right;Rolling Left;Supine to Sit;Sit to Supine Rolling Right: 6: Modified independent (Device/Increase time) Rolling Left: 6: Modified independent (Device/Increase time) Supine to Sit: 4: Min assist Sit to Supine: 4: Min assist Details for Bed Mobility Assistance: Pt able to sit on EOB x 4 minutes.  Pt was then able to scoot herself up to the head of the bed while sitting taking increased time. Transfers Transfers:  (PT refused stating she felt like she had done enough today)    Exercises General Exercises - Lower Extremity Ankle Circles/Pumps: AROM;Both;10 reps Quad Sets: AROM;Both;10 reps Gluteal Sets: AROM;Both;10 reps Long Arc Quad: AROM;Both;10 reps Heel Slides: AROM;Both;10 reps Hip ABduction/ADduction: AROM;Both;10 reps Straight Leg Raises: AROM;Both;5 reps Mini-Sqauts:  (bridging supine x 5)   PT Diagnosis: Generalized weakness  PT Problem List:  Decreased activity tolerance;Decreased mobility PT Treatment Interventions: Functional mobility training;Therapeutic exercise   PT Goals Acute Rehab PT Goals PT Goal Formulation: With patient Time For Goal Achievement: 05/14/12 Potential to Achieve Goals: Good Pt will go Sit to Supine/Side: with modified independence PT Goal: Sit to Supine/Side - Progress: Goal set today Pt will go Sit to Stand: with modified independence PT Goal: Sit to Stand - Progress: Goal set today Pt will Transfer Bed to Chair/Chair to Bed: with min assist PT Transfer Goal: Bed to Chair/Chair to Bed - Progress: Goal set today  Visit Information  Last PT Received On: 05/09/12    Subjective Data  Subjective: Helen [name] states that she lives at home with her son.  She has been wheelchair bound for the past 4-5 months but is able to transfer from her bed to the wheelchair and from the wheelchair to the commode I.  The patient has an aide who comes in five days a week four hours a day.   Patient Stated Goal: To go home   Prior Functioning       Cognition       Extremity/Trunk Assessment Right Lower Extremity Assessment RLE ROM/Strength/Tone: Deficits RLE ROM/Strength/Tone Deficits: Hip strength generally 3/5; knee 4/5 Left Lower Extremity Assessment LLE ROM/Strength/Tone: Deficits LLE ROM/Strength/Tone Deficits: hip strength generally 3/5; knee 4/5   Balance    End of Session PT - End of Session Activity Tolerance: Patient limited by fatigue Patient left: in bed;with call bell/phone within reach;with bed alarm set  GP     RUSSELL,CINDY 05/09/2012, 10:38 AM

## 2012-05-09 NOTE — Progress Notes (Signed)
Educational Handout given:  Gastrointestinal Bleed

## 2012-05-09 NOTE — Progress Notes (Signed)
[facility] PROGRESS NOTE  Helen [name] ZHY:[id number] DOB: 01-Oct-1924 DOA: 05/08/2012 PCP: Milinda Antis, MD Primary Gastroenterologist: Dr. Jena Gauss Cardiologist: Dr. Aurelio Jew   Assessment/Plan: 1. Lower GI bleed, presumed post polypectomy: Appears to be slowing down. Clear liquids, INR partially reversed. Serial CBC. Further recommendations per gastroenterology. 2. Acute blood loss anemia: Status post 2 units packed red blood cells. Follow serial CBC. 3. Atrial fibrillation: Rate controlled. Hold anticoagulation. INR 1.68. Resume metoprolol. 4. Subacute Diarrhea present on admission: C. difficile negative as outpatient. Status post colonoscopy 3/27--multiple colonic polyps status post piecemeal polypectomy. Colonic diverticulosis. 5. Diabetes mellitus type 2: Hemoglobin A1c 6.5. Blood sugars stable. Continue sliding scale insulin. 6. History of stroke: Stable. 7. History of seizures: Stable. Continue Keppra. 8. Dementia: Continue Aricept.  Plan: 1. Consult cardiology 2. Resume metoprolol 3. Consider transfer to medical bed later today if CBC stable and no further significant bleeding.   Code Status: DO NOT RESUSCITATE Family Communication: Discussed with night time caretaker at bedside Disposition Plan: Return home when improved  Brendia Sacks, MD  [facility]  Pager [phone] If 7PM-7AM, please contact night-coverage at [email or web address], password [facility] 05/09/2012, 8:14 AM  LOS: 1 day   Brief narrative: 79 year old woman status post colonoscopy 3/27 with multiple colonic polyps, status post piecemeal polypectomy, diverticulosis noted presented 4/1 with GI bleed. History of atrial fibrillation on Lovenox bridge, warfarin.  Consultants:  Gastroenterology  Cardiology  Procedures:  4/1 transfusion 2 units packed red blood cells  4/1 FFP  HPI/Subjective: Afebrile, vital signs stable. Small amount of bleeding. No pain. No nausea or vomiting. Tolerating liquids.  Feels fine. Nighttime caretaker at bedside.  Objective: Filed Vitals:   05/09/12 0400 05/09/12 0500 05/09/12 0600 05/09/12 0730  BP: 157/79  170/85   Pulse:      Temp:    99.8 F (37.7 C)  TempSrc:    Oral  Resp: 20 20 19    Height:      Weight:      SpO2:        Intake/Output Summary (Last 24 hours) at 05/09/12 0814 Last data filed at 05/09/12 0600  Gross per 24 hour  Intake 3098.75 ml  Output      0 ml  Net 3098.75 ml   Filed Weights   05/08/12 0454 05/08/12 0759  Weight: 84.823 kg (187 lb) 85.2 kg (187 lb 13.3 oz)    Exam:  General:  Examined in the ICU. Appears calm and comfortable well-appearing. Eyes: Pupils, lids, irises appear normal ENT: grossly normal hearing Cardiovascular: Irregular, normal rate, no m/r/g. No LE edema. Telemetry: SR, with periods of atrial fibrillation. NSVT noted.  Respiratory: CTA bilaterally, no w/r/r. Normal respiratory effort. Abdomen: soft, ntnd Psychiatric: grossly normal mood and affect, speech fluent and appropriate Neurologic: grossly non-focal.  Data Reviewed: Basic Metabolic Panel:  Recent Labs Lab 05/08/12 0459 05/08/12 0514 05/09/12 0457  NA 136 137 139  K 4.3 4.8 3.8  CL 107 109 106  CO2 19  --  22  GLUCOSE 251* 260* 156*  BUN 20 22 11   CREATININE 0.91 0.90 0.73  CALCIUM 8.3*  --  8.8   Liver Function Tests:  Recent Labs Lab 05/08/12 0459  AST 18  ALT 17  ALKPHOS 57  BILITOT 0.1*  PROT 5.2*  ALBUMIN 2.7*   CBC:  Recent Labs Lab 05/08/12 0459 05/08/12 0514 05/08/12 1107 05/08/12 1657 05/09/12 0038  WBC 6.5  --  7.6 9.1 7.0  NEUTROABS 4.7  --   --   --   --  HGB 8.6* 9.2* 9.2* 11.4* 9.6*  HCT 24.6* 27.0* 26.2* 32.4* 27.0*  MCV 92.8  --  91.3 88.8 88.2  PLT 141*  --  141* 142* 127*   CBG:  Recent Labs Lab 05/08/12 0806 05/08/12 1148 05/08/12 1602 05/08/12 2133  GLUCAP 223* 195* 136* 173*    Recent Results (from the past 240 hour(s))  CLOSTRIDIUM DIFFICILE BY PCR     Status: None    Collection Time    05/03/12 10:00 AM      Result Value Range Status   C difficile by pcr NEGATIVE  NEGATIVE Final  OVA AND PARASITE EXAMINATION     Status: None   Collection Time    05/03/12 10:00 AM      Result Value Range Status   Specimen Description STOOL ENDO SPECIMEN   Final   Special Requests NONE   Final   Ova and parasites NO OVA OR PARASITES SEEN   Final   Report Status 05/04/2012 FINAL   Final  STOOL CULTURE     Status: None   Collection Time    05/03/12 10:00 AM      Result Value Range Status   Specimen Description STOOL ENDO SPECIMEN   Final   Special Requests NONE   Final   Culture     Final   Value: NO SALMONELLA, SHIGELLA, CAMPYLOBACTER, YERSINIA, OR E.COLI 0157:H7 ISOLATED     Note: REDUCED NORMAL FLORA PRESENT   Report Status 05/08/2012 FINAL   Final  MRSA PCR SCREENING     Status: None   Collection Time    05/08/12  7:55 AM      Result Value Range Status   MRSA by PCR NEGATIVE  NEGATIVE Final   Comment:            The GeneXpert MRSA Assay (FDA     approved for NASAL specimens     only), is one component of a     comprehensive MRSA colonization     surveillance program. It is not     intended to diagnose MRSA     infection nor to guide or     monitor treatment for     MRSA infections.     Studies: No results found.  Scheduled Meds: . atorvastatin  10 mg Oral q1800  . citalopram  20 mg Oral Daily  . donepezil  10 mg Oral QHS  . fesoterodine  8 mg Oral Daily  . furosemide  20 mg Intravenous Once  . insulin aspart  0-15 Units Subcutaneous TID WC  . insulin aspart  0-5 Units Subcutaneous QHS  . levETIRAcetam  500 mg Oral BID   Continuous Infusions: . sodium chloride 75 mL/hr at 05/09/12 0600    Principal Problem:   GI bleed Active Problems:   Dementia   DM type 2 (diabetes mellitus, type 2)   HTN (hypertension)   Paroxysmal atrial fibrillation   Anticoagulant long-term use   Anemia   Seizure disorder   Brendia Sacks, MD  [facility] Pager [phone] If 7PM-7AM, please contact night-coverage at [email or web address], password [facility] 05/09/2012, 8:14 AM  LOS: 1 day   Time spent: 25 minutes

## 2012-05-10 ENCOUNTER — Telehealth: Payer: Self-pay | Admitting: Gastroenterology

## 2012-05-10 DIAGNOSIS — K922 Gastrointestinal hemorrhage, unspecified: Secondary | ICD-10-CM

## 2012-05-10 DIAGNOSIS — D62 Acute posthemorrhagic anemia: Secondary | ICD-10-CM

## 2012-05-10 DIAGNOSIS — IMO0002 Reserved for concepts with insufficient information to code with codable children: Secondary | ICD-10-CM

## 2012-05-10 LAB — CBC
MCH: 30.8 pg (ref 26.0–34.0)
MCHC: 34.5 g/dL (ref 30.0–36.0)
Platelets: 64 10*3/uL — ABNORMAL LOW (ref 150–400)
RBC: 3.15 MIL/uL — ABNORMAL LOW (ref 3.87–5.11)
RDW: 15.1 % (ref 11.5–15.5)

## 2012-05-10 LAB — GLUCOSE, CAPILLARY
Glucose-Capillary: 160 mg/dL — ABNORMAL HIGH (ref 70–99)
Glucose-Capillary: 144 mg/dL — ABNORMAL HIGH (ref 70–99)

## 2012-05-10 NOTE — Progress Notes (Addendum)
Subjective:  Late entry. Patient denies further bleeding. No BM today. Last bloody stool recorded at 1pm yesterday. Without complaints.   Objective: Vital signs in last 24 hours: Temp:  [98.3 F (36.8 C)-99.8 F (37.7 C)] 99.3 F (37.4 C) (04/03 1238) Pulse Rate:  [69-78] 69 (04/03 1044) Resp:  [13-26] 23 (04/03 1400) BP: (121-156)/(51-69) 128/61 mmHg (04/03 1400) SpO2:  [95 %-98 %] 95 % (04/03 0200) Weight:  [189 lb 9.5 oz (86 kg)] 189 lb 9.5 oz (86 kg) (04/03 0500) Last BM Date: 05/09/12 General:   Alert,  Well-developed, well-nourished, pleasant and cooperative in NAD Head:  Normocephalic and atraumatic. Eyes:  Sclera clear, no icterus.   Abdomen:  Soft, nontender and nondistended.  Normal bowel sounds, without guarding, and without rebound.   Extremities:  Without clubbing, deformity or edema. Neurologic:  Alert and  oriented x4;  grossly normal neurologically. Skin:  Intact without significant lesions or rashes. Psych:  Alert and cooperative. Normal mood and affect.  Intake/Output from previous day: 04/02 0701 - 04/03 0700 In: 920 [P.O.:920] Out: -  Intake/Output this shift: Total I/O In: 420 [P.O.:420] Out: -   Lab Results: CBC  Recent Labs  05/09/12 0840 05/09/12 1655 05/10/12 0038  WBC 12.3* 12.4* 10.1  HGB 11.4* 10.1* 9.7*  HCT 32.2* 28.7* 28.1*  MCV 88.7 88.0 89.2  PLT 150 139* 64*   BMET  Recent Labs  05/08/12 0459 05/08/12 0514 05/09/12 0457  NA 136 137 139  K 4.3 4.8 3.8  CL 107 109 106  CO2 19  --  22  GLUCOSE 251* 260* 156*  BUN 20 22 11   CREATININE 0.91 0.90 0.73  CALCIUM 8.3*  --  8.8   LFTs  Recent Labs  05/08/12 0459  BILITOT 0.1*  ALKPHOS 57  AST 18  ALT 17  PROT 5.2*  ALBUMIN 2.7*   No results found for this basename: LIPASE,  in the last 72 hours PT/INR  Recent Labs  05/08/12 0459 05/09/12 0457  LABPROT 23.4* 19.2*  INR 2.19* 1.68*      Imaging Studies: No results found.[2 weeks]    Assessment: 79 y/o  female with h/o AFib/prior CVA requiring anticoagulation, admitted with probable post-polypectomy bleed. No further bleeding noted at this time. H/H relatively stable the last 12 hours. Due for repeat CBC in AM given thrombocytopenia, likely due GI bleeding.      Plan: 1. Appreciate cardiology input. Hold coumadin for 10 days and resume on April 10th WITHOUT bridging or loading dose.  2. Advance diet.  3. Office visit in 3-6 months to schedule f/u colonoscopy given she has remaining polyps.    LOS: 2 days   Tana Coast  05/10/2012, 3:17 PM   Patient looks good this afternoon. She is now out of the ICU. Clinically, no further bleeding. I agree with advancing diet. Despite hospital discharge tomorrow per hospitalist service. Followup has been arranged.

## 2012-05-10 NOTE — Progress Notes (Signed)
Report was given to Benjaman Lobe, RN and pt was transferred via bed with personal belongings. Family was notified that she was moved to room 313.

## 2012-05-10 NOTE — Progress Notes (Signed)
[facility] PROGRESS NOTE  Helen [name] UJW:[id number] DOB: [birth year] DOA: 05/08/2012 PCP: Milinda Antis, MD Primary Gastroenterologist: Dr. Jena Gauss Cardiologist: Dr. Aurelio Jew   Assessment/Plan: 1. Lower GI bleed, presumed post polypectomy: Clear liquids, INR partially reversed. Serial CBC. Appreciate gastroenterology. 2. Acute blood loss anemia: Stable since transfusion. Status post 2 units packed red blood cells. Follow serial CBC. 3. Thrombocytopenia: Etiology unclear. Not on any heparin products. Repeat CBC in the morning. 4. Atrial fibrillation: Currently in sinus rhythm. Hold anticoagulation. Continue metoprolol. 5. Subacute Diarrhea present on admission: C. difficile negative as outpatient. Status post colonoscopy 3/27--multiple colonic polyps status post piecemeal polypectomy. Colonic diverticulosis. 6. Diabetes mellitus type 2: Overall stable. Hemoglobin A1c 6.5. Continue sliding scale insulin. Resume glipizide when diet advanced. 7. History of stroke: Stable. 8. History of seizures: Stable. Continue Keppra. 9. Dementia: Continue Aricept.  Plan: 1. Consider transfer to medical bed later today if CBC stable and no further significant bleeding. 2. Withhold all anticoagulation for 10 days and then reintroduce warfarin without enoxaparin bridging and without a "load". 3. Check INR, PTT in the morning  4. Repeat CBC in the morning for followup thrombocytopenia   Code Status: DO NOT RESUSCITATE Family Communication: Discussed with night time caretaker at bedside Disposition Plan: Return home when improved  Brendia Sacks, MD  [facility]  Pager [phone] If 7PM-7AM, please contact night-coverage at [email or web address], password [facility] 05/10/2012, 7:33 AM  LOS: 2 days   Brief narrative: 79 year old woman status post colonoscopy 3/27 with multiple colonic polyps, status post piecemeal polypectomy, diverticulosis noted presented 4/1 with GI bleed. History of atrial fibrillation  on Lovenox bridge, warfarin.  Consultants:  Gastroenterology  Cardiology  Physical therapy: No followup  Procedures:  4/1 transfusion 2 units packed red blood cells  4/1 FFP  HPI/Subjective: Had large maroon stool last evening. Afebrile, vital signs stable. Hemoglobin stable since last transfusion 4/1. No pain, nausea or vomiting. Discussed with RN--no further bleeding since yesterday afternoon.  Objective: Filed Vitals:   05/10/12 0100 05/10/12 0200 05/10/12 0400 05/10/12 0500  BP:  121/51    Pulse:      Temp:   99.7 F (37.6 C)   TempSrc:   Axillary   Resp: 21 23    Height:      Weight:    86 kg (189 lb 9.5 oz)  SpO2:  95%      Intake/Output Summary (Last 24 hours) at 05/10/12 0733 Last data filed at 05/09/12 1700  Gross per 24 hour  Intake    920 ml  Output      0 ml  Net    920 ml   Filed Weights   05/08/12 0454 05/08/12 0759 05/10/12 0500  Weight: 84.823 kg (187 lb) 85.2 kg (187 lb 13.3 oz) 86 kg (189 lb 9.5 oz)    Exam:  General:  Examined in the ICU. Appears calm and comfortable, well-appearing. Cardiovascular: Regular, normal rate, no m/r/g. No LE edema. Telemetry: SR, with period of SVT noted.  Respiratory: CTA bilaterally, no w/r/r. Normal respiratory effort. Abdomen: soft, ntnd Psychiatric: grossly normal mood and affect, speech fluent and appropriate  Data Reviewed: Basic Metabolic Panel:  Recent Labs Lab 05/08/12 0459 05/08/12 0514 05/09/12 0457  NA 136 137 139  K 4.3 4.8 3.8  CL 107 109 106  CO2 19  --  22  GLUCOSE 251* 260* 156*  BUN 20 22 11   CREATININE 0.91 0.90 0.73  CALCIUM 8.3*  --  8.8   Liver Function Tests:  Recent Labs Lab 05/08/12 0459  AST 18  ALT 17  ALKPHOS 57  BILITOT 0.1*  PROT 5.2*  ALBUMIN 2.7*   CBC:  Recent Labs Lab 05/08/12 0459  05/08/12 1657 05/09/12 0038 05/09/12 0840 05/09/12 1655 05/10/12 0038  WBC 6.5  < > 9.1 7.0 12.3* 12.4* 10.1  NEUTROABS 4.7  --   --   --   --   --   --   HGB  8.6*  < > 11.4* 9.6* 11.4* 10.1* 9.7*  HCT 24.6*  < > 32.4* 27.0* 32.2* 28.7* 28.1*  MCV 92.8  < > 88.8 88.2 88.7 88.0 89.2  PLT 141*  < > 142* 127* 150 139* 64*  < > = values in this interval not displayed. CBG:  Recent Labs Lab 05/08/12 2133 05/09/12 0816 05/09/12 1123 05/09/12 1638 05/09/12 2131  GLUCAP 173* 177* 260* 142* 156*    Recent Results (from the past 240 hour(s))  CLOSTRIDIUM DIFFICILE BY PCR     Status: None   Collection Time    05/03/12 10:00 AM      Result Value Range Status   C difficile by pcr NEGATIVE  NEGATIVE Final  OVA AND PARASITE EXAMINATION     Status: None   Collection Time    05/03/12 10:00 AM      Result Value Range Status   Specimen Description STOOL ENDO SPECIMEN   Final   Special Requests NONE   Final   Ova and parasites NO OVA OR PARASITES SEEN   Final   Report Status 05/04/2012 FINAL   Final  STOOL CULTURE     Status: None   Collection Time    05/03/12 10:00 AM      Result Value Range Status   Specimen Description STOOL ENDO SPECIMEN   Final   Special Requests NONE   Final   Culture     Final   Value: NO SALMONELLA, SHIGELLA, CAMPYLOBACTER, YERSINIA, OR E.COLI 0157:H7 ISOLATED     Note: REDUCED NORMAL FLORA PRESENT   Report Status 05/08/2012 FINAL   Final  MRSA PCR SCREENING     Status: None   Collection Time    05/08/12  7:55 AM      Result Value Range Status   MRSA by PCR NEGATIVE  NEGATIVE Final   Comment:            The GeneXpert MRSA Assay (FDA     approved for NASAL specimens     only), is one component of a     comprehensive MRSA colonization     surveillance program. It is not     intended to diagnose MRSA     infection nor to guide or     monitor treatment for     MRSA infections.     Studies: No results found.  Scheduled Meds: . atorvastatin  10 mg Oral q1800  . citalopram  20 mg Oral Daily  . donepezil  10 mg Oral QHS  . fesoterodine  8 mg Oral Daily  . furosemide  20 mg Intravenous Once  . insulin aspart   0-15 Units Subcutaneous TID WC  . insulin aspart  0-5 Units Subcutaneous QHS  . levETIRAcetam  500 mg Oral BID  . lisinopril  10 mg Oral Daily  . metoprolol tartrate  12.5 mg Oral BID   Continuous Infusions:    Principal Problem:   GI bleed Active Problems:   Dementia   DM type 2 (diabetes mellitus, type 2)  HTN (hypertension)   Paroxysmal atrial fibrillation   Anticoagulant long-term use   Anemia   Seizure disorder   Brendia Sacks, MD  [facility] Pager [phone] If 7PM-7AM, please contact night-coverage at [email or web address], password [facility] 05/10/2012, 7:33 AM  LOS: 2 days   Time spent: 20 minutes

## 2012-05-10 NOTE — Telephone Encounter (Signed)
Please schedule OV with Dr. Jena Gauss ONLY for 09/2012 to schedule TCS for remaining colon polyps.

## 2012-05-10 NOTE — Progress Notes (Signed)
Physical Therapy Treatment Patient Details Name: RYNA BECKSTROM MRN: [id number] DOB: 07/30/1924 Today's Date: 05/10/2012 Time: 6578-4696 PT Time Calculation (min): 22 min  PT Assessment / Plan / Recommendation Comments on Treatment Session  Pt did well with all exercises and bed mobility.  She was limited in her treatment session today due to becoming dizzy and nauseated with sitting.  Will advance pt actiivity as tolerated.    Follow Up Recommendations  Home health PT     Does the patient have the potential to tolerate intense rehabilitation   no  Barriers to Discharge  none      Equipment Recommendations  None recommended by PT    Recommendations for Other Services  none  Frequency Min 3X/week   Plan Discharge plan needs to be updated    Precautions / Restrictions Precautions Precautions: Fall Restrictions Weight Bearing Restrictions: No   Pertinent Vitals/Pain 0/10    Mobility  Bed Mobility Rolling Right: 6: Modified independent (Device/Increase time) Rolling Left: 6: Modified independent (Device/Increase time) Supine to Sit: 4: Min assist Sit to Supine: 6: Modified independent (Device/Increase time) Details for Bed Mobility Assistance: Pt able to sit EOB x 5 minutes today.  Pt stated she could not attempt to get into the chair due to nausea and being dizzy.  PT was able to scoot herself to the head of the bed while sitting with increased ease today. Transfers Transfers: Not assessed    Exercises General Exercises - Lower Extremity Ankle Circles/Pumps: Both;10 reps Quad Sets: Both;10 reps Gluteal Sets:  (bridge x 7) Heel Slides: Strengthening;Both;10 reps Hip ABduction/ADduction: Strengthening;Both;10 reps Straight Leg Raises: Strengthening;Both;10 reps   PT Diagnosis:   decreased activity tolerance PT Problem List:  weakness  PT Treatment Interventions:   there activity, there exercise  PT Goals Acute Rehab PT Goals PT Goal Formulation: With patient Pt will  go Sit to Supine/Side: with modified independence PT Goal: Sit to Supine/Side - Progress: Met Pt will go Sit to Stand: with modified independence PT Goal: Sit to Stand - Progress: Not progressing Pt will Transfer Bed to Chair/Chair to Bed: with min assist PT Transfer Goal: Bed to Chair/Chair to Bed - Progress: Not progressing  Visit Information  Last PT Received On: 05/10/12    Subjective Data  Subjective: Ms. Gavina states that she is feeling better today.  She did become nauseated with treatment Patient Stated Goal: To go home   Cognition  Cognition Overall Cognitive Status: Appears within functional limits for tasks assessed/performed Arousal/Alertness: Awake/alert Orientation Level: Appears intact for tasks assessed Behavior During Session: [facility] for tasks performed    Balance     End of Session PT - End of Session Activity Tolerance:  (limited due to dizziness and nausea) Patient left: in bed;with call bell/phone within reach;with bed alarm set   GP     RUSSELL,CINDY 05/10/2012, 11:21 AM

## 2012-05-11 LAB — GLUCOSE, CAPILLARY
Glucose-Capillary: 175 mg/dL — ABNORMAL HIGH (ref 70–99)
Glucose-Capillary: 214 mg/dL — ABNORMAL HIGH (ref 70–99)

## 2012-05-11 LAB — BASIC METABOLIC PANEL WITH GFR
BUN: 10 mg/dL (ref 6–23)
CO2: 23 meq/L (ref 19–32)
Calcium: 8.4 mg/dL (ref 8.4–10.5)
Chloride: 101 meq/L (ref 96–112)
Creatinine, Ser: 0.82 mg/dL (ref 0.50–1.10)
GFR calc Af Amer: 74 mL/min — ABNORMAL LOW (ref >90)
GFR calc non Af Amer: 63 mL/min — ABNORMAL LOW (ref >90)
Glucose, Bld: 178 mg/dL — ABNORMAL HIGH (ref 70–99)
Potassium: 3.8 meq/L (ref 3.5–5.1)
Sodium: 135 meq/L (ref 135–145)

## 2012-05-11 LAB — CBC
MCH: 30.9 pg (ref 26.0–34.0)
MCHC: 34.4 g/dL (ref 30.0–36.0)
MCV: 89.9 fL (ref 78.0–100.0)
Platelets: 150 10*3/uL (ref 150–400)
RDW: 14.6 % (ref 11.5–15.5)

## 2012-05-11 LAB — APTT: aPTT: 27 s (ref 24–37)

## 2012-05-11 LAB — PROTIME-INR: Prothrombin Time: 15.6 seconds — ABNORMAL HIGH (ref 11.6–15.2)

## 2012-05-11 NOTE — Progress Notes (Signed)
Physical Therapy Treatment Patient Details Name: Helen [name] MRN: [id number] DOB: [birth year] Today's Date: 05/11/2012 Time: 0940-1000 PT Time Calculation (min): 20 min  PT Assessment / Plan / Recommendation Comments on Treatment Session  PT is currently at prior level status no further PT is needed.    Follow Up Recommendations  No PT follow up     Does the patient have the potential to tolerate intense rehabilitation   no  Barriers to Discharge None      Equipment Recommendations  None recommended by PT    Recommendations for Other Services  none     Plan Discharge plan needs to be updated    Precautions / Restrictions Precautions Precautions: None Restrictions Weight Bearing Restrictions: No   Pertinent Vitals/Pain 0    Mobility  Bed Mobility Rolling Right: 6: Modified independent (Device/Increase time) Rolling Left: 6: Modified independent (Device/Increase time) Supine to Sit: 6: Modified independent (Device/Increase time) Sit to Supine: Not Tested (comment) Transfers Sit to Stand: 5: Supervision Ambulation/Gait Ambulation/Gait Assistance: 4: Min guard Ambulation Distance (Feet): 6 Feet Assistive device: Rolling walker Gait Pattern: Within Functional Limits    Exercises General Exercises - Lower Extremity Ankle Circles/Pumps: AROM;Both;10 reps Long Arc Quad: Strengthening;Both;10 reps Hip ABduction/ADduction: Strengthening;Both;10 reps Straight Leg Raises: Strengthening;Both;10 reps   PT Diagnosis:    PT Problem List:   PT Treatment Interventions: Gait training;Therapeutic activities   PT Goals Acute Rehab PT Goals PT Goal Formulation: With patient Pt will go Sit to Supine/Side: with modified independence PT Goal: Sit to Supine/Side - Progress: Met Pt will go Sit to Stand: with modified independence PT Goal: Sit to Stand - Progress: Met Pt will Transfer Bed to Chair/Chair to Bed: with min assist (able to transfer with contact gaurd assist.) PT  Transfer Goal: Bed to Chair/Chair to Bed - Progress: Met  Visit Information  Last PT Received On: 05/11/12    Subjective Data  Subjective: I feel better today Patient Stated Goal: To go home   Cognition    wnl   Balance   functional  End of Session PT - End of Session Equipment Utilized During Treatment: Gait belt Patient left: with call bell/phone within reach;in chair   GP     [name],CINDY 05/11/2012, 10:09 AM

## 2012-05-11 NOTE — Discharge Summary (Signed)
Physician Discharge Summary  Helen [name] WGN:[id number] DOB: 11-28-1924 DOA: 05/08/2012  PCP: Milinda Antis, MD Primary Gastroenterologist: Dr. Jena Gauss Cardiologist: Dr. Royann Shivers    Admit date: 05/08/2012 Discharge date: 05/11/2012  Recommendations for Outpatient Follow-up:  1. Followup lower GI bleed, GI has cleared the patient to restart warfarin without "load" or bridge 4/10.  2. Followup anemia, consider CBC as an outpatient  Follow-up Information   Follow up with Milinda Antis, MD In 1 week.   Contact information:   [address] [place] [address] [phone]       Follow up with Thurmon Fair, MD. Schedule an appointment as soon as possible for a visit in 1 week. (PT/INR check)    Contact information:   [address] [place] [address] [phone]      Discharge Diagnoses:  1. Lower GI bleed post polypectomy complicated by anticoagulation 2. Acute blood loss anemia 3. Atrial fibrillation 4. Diabetes mellitus type 2, controlled  Discharge Condition: Improved Disposition: Home  Diet recommendation: Diabetic  Filed Weights   05/08/12 0454 05/08/12 0759 05/10/12 0500  Weight: 84.823 kg (187 lb) 85.2 kg (187 lb 13.3 oz) 86 kg (189 lb 9.5 oz)    History of present illness:  79 year old woman status post colonoscopy 3/27 with multiple colonic polyps, status post piecemeal polypectomy, diverticulosis noted presented 4/1 with GI bleed. History of atrial fibrillation on Lovenox bridge, warfarin.  Hospital Course:  Ms. Boyson was admitted for lower GI bleed and seen in consultation with gastroenterology. INR was reversed and bleeding stopped of the course of several days. She did require blood transfusion but hospitalization was relatively uncomplicated. No procedures were performed given the same etiology. She was seen by cardiology and both GI and cardiology who recommended starting Coumadin 4/10 as above.  1. Lower GI bleed, presumed  post polypectomy: CBC stable. INR 1.27. Clear for discharge by gastroenterology. 2. Acute blood loss anemia: Stable since transfusion. Status post 2 units packed red blood cells.  3. Thrombocytopenia: Resolved, likely related to GI bleed. 4. Atrial fibrillation: Currently in sinus rhythm. Hold anticoagulation. Continue metoprolol. 5. Subacute Diarrhea present on admission: C. difficile negative as outpatient. Status post colonoscopy 3/27--multiple colonic polyps status post piecemeal polypectomy. Colonic diverticulosis. 6. Diabetes mellitus type 2: Stable. Hemoglobin A1c 6.5. Continue sliding scale insulin. Resume glipizide on discharge. 7. History of stroke: Stable. 8. History of seizures: Stable. Continue Keppra. 9. Dementia: Continue Aricept.  Plan: 1. Withhold all anticoagulation for 10 days and then reintroduce warfarin without enoxaparin bridging and without a "load". To start April 10. 2. GI Office visit in 3-6 months to schedule f/u colonoscopy given she has remaining polyps  Consultants:  Gastroenterology  Cardiology  Physical therapy: No PT  Procedures:  4/1 transfusion 2 units packed red blood cells  4/1 FFP  Discharge Instructions  Discharge Orders   Future Appointments Provider Department Dept Phone   06/14/2012 2:00 PM De Blanch [facility] [phone]   06/25/2012 3:45 PM Salley Scarlet, MD [facility] [phone]   Future Orders Complete By Expires     Diet Carb Modified  As directed     Discharge instructions  As directed     Comments:      You may restart Coumadin 4/10. Contact Dr. Erin Hearing office for dosing instructions. Call your physician or seek immediate medical attention for bleeding or worsening of condition.    Increase activity slowly  As directed  Medication List    STOP taking these medications       acetaminophen 500 MG tablet  Commonly known as:  TYLENOL     enoxaparin 80  MG/0.8ML injection  Commonly known as:  LOVENOX     sulfamethoxazole-trimethoprim 400-80 MG per tablet  Commonly known as:  BACTRIM,SEPTRA     traMADol 50 MG tablet  Commonly known as:  ULTRAM     warfarin 4 MG tablet  Commonly known as:  COUMADIN      TAKE these medications       atorvastatin 10 MG tablet  Commonly known as:  LIPITOR  Take 10 mg by mouth daily.     CENTRUM SILVER ULTRA WOMENS PO  Take 1 tablet by mouth every morning. At 700     diphenoxylate-atropine 2.5-0.025 MG per tablet  Commonly known as:  LOMOTIL  Take 1 tablet by mouth 4 (four) times daily as needed for diarrhea or loose stools.     donepezil 10 MG tablet  Commonly known as:  ARICEPT  Take 10 mg by mouth at bedtime.     escitalopram 5 MG tablet  Commonly known as:  LEXAPRO  Take 5 mg by mouth daily.     ferrous sulfate 325 (65 FE) MG tablet  Take 1 tablet (325 mg total) by mouth daily with breakfast.     folic acid 1 MG tablet  Commonly known as:  FOLVITE  Take 1 mg by mouth daily. At 700     glipiZIDE 5 MG tablet  Commonly known as:  GLUCOTROL  Take 5 mg by mouth daily.     levETIRAcetam 500 MG tablet  Commonly known as:  KEPPRA  Take 500 mg by mouth 2 (two) times daily.     lisinopril 10 MG tablet  Commonly known as:  PRINIVIL,ZESTRIL  Take 10 mg by mouth daily.     metoprolol tartrate 25 MG tablet  Commonly known as:  LOPRESSOR  Take 12.5 mg by mouth 2 (two) times daily.     MYRBETRIQ 50 MG Tb24  Generic drug:  mirabegron ER  Take 50 mg by mouth daily.     potassium chloride 10 MEQ tablet  Commonly known as:  K-DUR  Take 10 mEq by mouth 2 (two) times daily.     protective barrier Crea  Apply 1 application topically daily.     TOVIAZ 8 MG Tb24  Generic drug:  fesoterodine  Take 8 mg by mouth daily.     zolpidem 5 MG tablet  Commonly known as:  AMBIEN  Take 5 mg by mouth at bedtime as needed for sleep.        The results of significant diagnostics from this  hospitalization (including imaging, microbiology, ancillary and laboratory) are listed below for reference.    Significant Diagnostic Studies: No results found.  Microbiology: Recent Results (from the past 240 hour(s))  CLOSTRIDIUM DIFFICILE BY PCR     Status: None   Collection Time    05/03/12 10:00 AM      Result Value Range Status   C difficile by pcr NEGATIVE  NEGATIVE Final  OVA AND PARASITE EXAMINATION     Status: None   Collection Time    05/03/12 10:00 AM      Result Value Range Status   Specimen Description STOOL ENDO SPECIMEN   Final   Special Requests NONE   Final   Ova and parasites NO OVA OR PARASITES SEEN   Final   Report  Status 05/04/2012 FINAL   Final  STOOL CULTURE     Status: None   Collection Time    05/03/12 10:00 AM      Result Value Range Status   Specimen Description STOOL ENDO SPECIMEN   Final   Special Requests NONE   Final   Culture     Final   Value: NO SALMONELLA, SHIGELLA, CAMPYLOBACTER, YERSINIA, OR E.COLI 0157:H7 ISOLATED     Note: REDUCED NORMAL FLORA PRESENT   Report Status 05/08/2012 FINAL   Final  MRSA PCR SCREENING     Status: None   Collection Time    05/08/12  7:55 AM      Result Value Range Status   MRSA by PCR NEGATIVE  NEGATIVE Final   Comment:            The GeneXpert MRSA Assay (FDA     approved for NASAL specimens     only), is one component of a     comprehensive MRSA colonization     surveillance program. It is not     intended to diagnose MRSA     infection nor to guide or     monitor treatment for     MRSA infections.     Labs: Basic Metabolic Panel:  Recent Labs Lab 05/08/12 0459 05/08/12 0514 05/09/12 0457 05/11/12 0529  NA 136 137 139 135  K 4.3 4.8 3.8 3.8  CL 107 109 106 101  CO2 19  --  22 23  GLUCOSE 251* 260* 156* 178*  BUN 20 22 11 10   CREATININE 0.91 0.90 0.73 0.82  CALCIUM 8.3*  --  8.8 8.4   Liver Function Tests:  Recent Labs Lab 05/08/12 0459  AST 18  ALT 17  ALKPHOS 57  BILITOT 0.1*   PROT 5.2*  ALBUMIN 2.7*   CBC:  Recent Labs Lab 05/08/12 0459  05/09/12 0038 05/09/12 0840 05/09/12 1655 05/10/12 0038 05/11/12 0529  WBC 6.5  < > 7.0 12.3* 12.4* 10.1 9.3  NEUTROABS 4.7  --   --   --   --   --   --   HGB 8.6*  < > 9.6* 11.4* 10.1* 9.7* 9.5*  HCT 24.6*  < > 27.0* 32.2* 28.7* 28.1* 27.6*  MCV 92.8  < > 88.2 88.7 88.0 89.2 89.9  PLT 141*  < > 127* 150 139* 64* 150  < > = values in this interval not displayed. CBG:  Recent Labs Lab 05/10/12 1129 05/10/12 1601 05/10/12 2215 05/11/12 0735 05/11/12 1109  GLUCAP 160* 144* 195* 175* 214*    Principal Problem:   GI bleed Active Problems:   Dementia   DM type 2 (diabetes mellitus, type 2)   HTN (hypertension)   Paroxysmal atrial fibrillation   Anticoagulant long-term use   Anemia   Seizure disorder   Time coordinating discharge: 35 minutes  Signed:  Brendia Sacks, MD [facility] 05/11/2012, 1:45 PM

## 2012-05-11 NOTE — Progress Notes (Signed)
Patient discharged home with caregiver.  Follow up to have INR checked in place.  Will schedule appointment with PCP for 1 week.  Instructed to stop taking lovenox and coumadin.  Will start coumadin on 4/10 after having INR drawn.  IVs removed -WNL.  Verbalizes understanding of discharge instructions.  No questions at this time.  Stable for discharge.

## 2012-05-11 NOTE — Progress Notes (Signed)
[facility] PROGRESS NOTE  Helen [name] NGE:[id number] DOB: [birth year] DOA: 05/08/2012 PCP: Milinda Antis, MD Primary Gastroenterologist: Dr. Jena Gauss Cardiologist: Dr. Royann Shivers   Assessment/Plan: 1. Lower GI bleed, presumed post polypectomy: CBC stable. INR 1.27. Appreciate gastroenterology. 2. Acute blood loss anemia: Stable since transfusion. Status post 2 units packed red blood cells.  3. Thrombocytopenia: Resolved, likely related to GI bleed. 4. Atrial fibrillation: Currently in sinus rhythm. Hold anticoagulation. Continue metoprolol. 5. Subacute Diarrhea present on admission: C. difficile negative as outpatient. Status post colonoscopy 3/27--multiple colonic polyps status post piecemeal polypectomy. Colonic diverticulosis. 6. Diabetes mellitus type 2: Stable. Hemoglobin A1c 6.5. Continue sliding scale insulin. Resume glipizide on discharge. 7. History of stroke: Stable. 8. History of seizures: Stable. Continue Keppra. 9. Dementia: Continue Aricept.  Plan: 1. Withhold all anticoagulation for 10 days and then reintroduce warfarin without enoxaparin bridging and without a "load". To start April 10. 2. GI Office visit in 3-6 months to schedule f/u colonoscopy given she has remaining polyps 3. Home today  Code Status: DO NOT RESUSCITATE Family Communication: Discussed with son by telephone, agrees with discharge Disposition Plan: Return home with son  Brendia Sacks, MD  [facility]  Pager [phone] If 7PM-7AM, please contact night-coverage at [email or web address], password [facility] 05/11/2012, 12:25 PM  LOS: 3 days   Brief narrative: 79 year old woman status post colonoscopy 3/27 with multiple colonic polyps, status post piecemeal polypectomy, diverticulosis noted presented 4/1 with GI bleed. History of atrial fibrillation on Lovenox bridge, warfarin.  Consultants:  Gastroenterology  Cardiology  Physical therapy: No PT  Procedures:  4/1 transfusion 2 units packed red  blood cells  4/1 FFP  HPI/Subjective: Afebrile, vital signs stable. No bleeding, no pain. Discussed with RN--confirms no bleeding.   Objective: Filed Vitals:   05/10/12 1300 05/10/12 1400 05/10/12 2109 05/11/12 0708  BP:  128/61 113/48 149/61  Pulse:   64 69  Temp:   98.3 F (36.8 C) 97.8 F (36.6 C)  TempSrc:   Oral Oral  Resp: 23 23 20 20   Height:      Weight:      SpO2:   98% 97%    Intake/Output Summary (Last 24 hours) at 05/11/12 1225 Last data filed at 05/11/12 0900  Gross per 24 hour  Intake    480 ml  Output      0 ml  Net    480 ml   Filed Weights   05/08/12 0454 05/08/12 0759 05/10/12 0500  Weight: 84.823 kg (187 lb) 85.2 kg (187 lb 13.3 oz) 86 kg (189 lb 9.5 oz)    Exam:  General:  Appears calm and comfortable, well-appearing. Cardiovascular: Regular, normal rate, no m/r/g.  Respiratory: CTA bilaterally, no w/r/r. Normal respiratory effort. Abdomen: soft, ntnd Psychiatric: grossly normal mood and affect, speech fluent and appropriate  Data Reviewed: Basic Metabolic Panel:  Recent Labs Lab 05/08/12 0459 05/08/12 0514 05/09/12 0457 05/11/12 0529  NA 136 137 139 135  K 4.3 4.8 3.8 3.8  CL 107 109 106 101  CO2 19  --  22 23  GLUCOSE 251* 260* 156* 178*  BUN 20 22 11 10   CREATININE 0.91 0.90 0.73 0.82  CALCIUM 8.3*  --  8.8 8.4   Liver Function Tests:  Recent Labs Lab 05/08/12 0459  AST 18  ALT 17  ALKPHOS 57  BILITOT 0.1*  PROT 5.2*  ALBUMIN 2.7*   CBC:  Recent Labs Lab 05/08/12 0459  05/09/12 0038 05/09/12 0840 05/09/12 1655 05/10/12 0038 05/11/12 0529  WBC 6.5  < > 7.0 12.3* 12.4* 10.1 9.3  NEUTROABS 4.7  --   --   --   --   --   --   HGB 8.6*  < > 9.6* 11.4* 10.1* 9.7* 9.5*  HCT 24.6*  < > 27.0* 32.2* 28.7* 28.1* 27.6*  MCV 92.8  < > 88.2 88.7 88.0 89.2 89.9  PLT 141*  < > 127* 150 139* 64* 150  < > = values in this interval not displayed. CBG:  Recent Labs Lab 05/10/12 1129 05/10/12 1601 05/10/12 2215  05/11/12 0735 05/11/12 1109  GLUCAP 160* 144* 195* 175* 214*    Recent Results (from the past 240 hour(s))  CLOSTRIDIUM DIFFICILE BY PCR     Status: None   Collection Time    05/03/12 10:00 AM      Result Value Range Status   C difficile by pcr NEGATIVE  NEGATIVE Final  OVA AND PARASITE EXAMINATION     Status: None   Collection Time    05/03/12 10:00 AM      Result Value Range Status   Specimen Description STOOL ENDO SPECIMEN   Final   Special Requests NONE   Final   Ova and parasites NO OVA OR PARASITES SEEN   Final   Report Status 05/04/2012 FINAL   Final  STOOL CULTURE     Status: None   Collection Time    05/03/12 10:00 AM      Result Value Range Status   Specimen Description STOOL ENDO SPECIMEN   Final   Special Requests NONE   Final   Culture     Final   Value: NO SALMONELLA, SHIGELLA, CAMPYLOBACTER, YERSINIA, OR E.COLI 0157:H7 ISOLATED     Note: REDUCED NORMAL FLORA PRESENT   Report Status 05/08/2012 FINAL   Final  MRSA PCR SCREENING     Status: None   Collection Time    05/08/12  7:55 AM      Result Value Range Status   MRSA by PCR NEGATIVE  NEGATIVE Final   Comment:            The GeneXpert MRSA Assay (FDA     approved for NASAL specimens     only), is one component of a     comprehensive MRSA colonization     surveillance program. It is not     intended to diagnose MRSA     infection nor to guide or     monitor treatment for     MRSA infections.     Studies: No results found.  Scheduled Meds: . atorvastatin  10 mg Oral q1800  . citalopram  20 mg Oral Daily  . donepezil  10 mg Oral QHS  . fesoterodine  8 mg Oral Daily  . furosemide  20 mg Intravenous Once  . insulin aspart  0-15 Units Subcutaneous TID WC  . insulin aspart  0-5 Units Subcutaneous QHS  . levETIRAcetam  500 mg Oral BID  . lisinopril  10 mg Oral Daily  . metoprolol tartrate  12.5 mg Oral BID   Continuous Infusions:    Principal Problem:   GI bleed Active Problems:   Dementia    DM type 2 (diabetes mellitus, type 2)   HTN (hypertension)   Paroxysmal atrial fibrillation   Anticoagulant long-term use   Anemia   Seizure disorder   Brendia Sacks, MD  [facility] Pager [phone] If 7PM-7AM, please contact night-coverage at [email or web address], password [facility] 05/11/2012, 12:25 PM  LOS: 3 days

## 2012-05-14 ENCOUNTER — Telehealth: Payer: Self-pay | Admitting: Family Medicine

## 2012-05-14 NOTE — Telephone Encounter (Signed)
After she came home from hospital her sugars have been high. Lastnight it was 370 and around noon today it was 330. Son doesn't know why its staying so high. Please advise

## 2012-05-14 NOTE — Telephone Encounter (Signed)
Son aware.

## 2012-05-14 NOTE — Telephone Encounter (Signed)
Have her start back lantus 10 units at bedtime, call back with sugars on Friday

## 2012-05-15 ENCOUNTER — Encounter (HOSPITAL_COMMUNITY): Payer: Self-pay | Admitting: *Deleted

## 2012-05-15 ENCOUNTER — Emergency Department (HOSPITAL_COMMUNITY): Payer: Medicare Other

## 2012-05-15 ENCOUNTER — Other Ambulatory Visit: Payer: Self-pay | Admitting: Internal Medicine

## 2012-05-15 ENCOUNTER — Telehealth: Payer: Self-pay | Admitting: Family Medicine

## 2012-05-15 ENCOUNTER — Inpatient Hospital Stay (HOSPITAL_COMMUNITY)
Admission: EM | Admit: 2012-05-15 | Discharge: 2012-05-18 | DRG: 065 | Disposition: A | Discharge status: Home / Self Care | Payer: Medicare Other | Attending: Internal Medicine | Admitting: Internal Medicine

## 2012-05-15 DIAGNOSIS — I6340 Cerebral infarction due to embolism of unspecified cerebral artery: Principal | ICD-10-CM | POA: Diagnosis present

## 2012-05-15 DIAGNOSIS — M67919 Unspecified disorder of synovium and tendon, unspecified shoulder: Secondary | ICD-10-CM

## 2012-05-15 DIAGNOSIS — E785 Hyperlipidemia, unspecified: Secondary | ICD-10-CM | POA: Diagnosis present

## 2012-05-15 DIAGNOSIS — D649 Anemia, unspecified: Secondary | ICD-10-CM | POA: Diagnosis present

## 2012-05-15 DIAGNOSIS — I639 Cerebral infarction, unspecified: Secondary | ICD-10-CM | POA: Diagnosis present

## 2012-05-15 DIAGNOSIS — F329 Major depressive disorder, single episode, unspecified: Secondary | ICD-10-CM | POA: Diagnosis present

## 2012-05-15 DIAGNOSIS — F0150 Vascular dementia without behavioral disturbance: Secondary | ICD-10-CM | POA: Diagnosis present

## 2012-05-15 DIAGNOSIS — G309 Alzheimer's disease, unspecified: Secondary | ICD-10-CM | POA: Diagnosis present

## 2012-05-15 DIAGNOSIS — I4891 Unspecified atrial fibrillation: Secondary | ICD-10-CM | POA: Diagnosis present

## 2012-05-15 DIAGNOSIS — Z8249 Family history of ischemic heart disease and other diseases of the circulatory system: Secondary | ICD-10-CM

## 2012-05-15 DIAGNOSIS — Z888 Allergy status to other drugs, medicaments and biological substances status: Secondary | ICD-10-CM

## 2012-05-15 DIAGNOSIS — R4789 Other speech disturbances: Secondary | ICD-10-CM | POA: Diagnosis present

## 2012-05-15 DIAGNOSIS — I672 Cerebral atherosclerosis: Secondary | ICD-10-CM | POA: Diagnosis present

## 2012-05-15 DIAGNOSIS — N390 Urinary tract infection, site not specified: Secondary | ICD-10-CM | POA: Diagnosis present

## 2012-05-15 DIAGNOSIS — Z66 Do not resuscitate: Secondary | ICD-10-CM | POA: Diagnosis present

## 2012-05-15 DIAGNOSIS — I480 Paroxysmal atrial fibrillation: Secondary | ICD-10-CM | POA: Diagnosis present

## 2012-05-15 DIAGNOSIS — G40909 Epilepsy, unspecified, not intractable, without status epilepticus: Secondary | ICD-10-CM | POA: Diagnosis present

## 2012-05-15 DIAGNOSIS — Z79899 Other long term (current) drug therapy: Secondary | ICD-10-CM

## 2012-05-15 DIAGNOSIS — I10 Essential (primary) hypertension: Secondary | ICD-10-CM | POA: Diagnosis present

## 2012-05-15 DIAGNOSIS — A498 Other bacterial infections of unspecified site: Secondary | ICD-10-CM | POA: Diagnosis present

## 2012-05-15 DIAGNOSIS — M25562 Pain in left knee: Secondary | ICD-10-CM

## 2012-05-15 DIAGNOSIS — R197 Diarrhea, unspecified: Secondary | ICD-10-CM

## 2012-05-15 DIAGNOSIS — K922 Gastrointestinal hemorrhage, unspecified: Secondary | ICD-10-CM

## 2012-05-15 DIAGNOSIS — I6350 Cerebral infarction due to unspecified occlusion or stenosis of unspecified cerebral artery: Secondary | ICD-10-CM

## 2012-05-15 DIAGNOSIS — W19XXXD Unspecified fall, subsequent encounter: Secondary | ICD-10-CM

## 2012-05-15 DIAGNOSIS — M129 Arthropathy, unspecified: Secondary | ICD-10-CM | POA: Diagnosis present

## 2012-05-15 DIAGNOSIS — N3941 Urge incontinence: Secondary | ICD-10-CM

## 2012-05-15 DIAGNOSIS — I2510 Atherosclerotic heart disease of native coronary artery without angina pectoris: Secondary | ICD-10-CM | POA: Diagnosis present

## 2012-05-15 DIAGNOSIS — Z8673 Personal history of transient ischemic attack (TIA), and cerebral infarction without residual deficits: Secondary | ICD-10-CM

## 2012-05-15 DIAGNOSIS — F0390 Unspecified dementia without behavioral disturbance: Secondary | ICD-10-CM | POA: Diagnosis present

## 2012-05-15 DIAGNOSIS — F3289 Other specified depressive episodes: Secondary | ICD-10-CM | POA: Diagnosis present

## 2012-05-15 DIAGNOSIS — E119 Type 2 diabetes mellitus without complications: Secondary | ICD-10-CM | POA: Diagnosis present

## 2012-05-15 DIAGNOSIS — L8991 Pressure ulcer of unspecified site, stage 1: Secondary | ICD-10-CM

## 2012-05-15 DIAGNOSIS — M1710 Unilateral primary osteoarthritis, unspecified knee: Secondary | ICD-10-CM

## 2012-05-15 DIAGNOSIS — R5381 Other malaise: Secondary | ICD-10-CM | POA: Diagnosis present

## 2012-05-15 DIAGNOSIS — Z881 Allergy status to other antibiotic agents status: Secondary | ICD-10-CM

## 2012-05-15 DIAGNOSIS — F0280 Dementia in other diseases classified elsewhere without behavioral disturbance: Secondary | ICD-10-CM | POA: Diagnosis present

## 2012-05-15 DIAGNOSIS — Z7901 Long term (current) use of anticoagulants: Secondary | ICD-10-CM

## 2012-05-15 LAB — COMPREHENSIVE METABOLIC PANEL
AST: 19 U/L (ref 0–37)
CO2: 25 mEq/L (ref 19–32)
Calcium: 9.4 mg/dL (ref 8.4–10.5)
Creatinine, Ser: 0.94 mg/dL (ref 0.50–1.10)
GFR calc Af Amer: 62 mL/min — ABNORMAL LOW (ref >90)
GFR calc non Af Amer: 54 mL/min — ABNORMAL LOW (ref >90)
Total Protein: 7.2 g/dL (ref 6.0–8.3)

## 2012-05-15 LAB — RAPID URINE DRUG SCREEN, HOSP PERFORMED
Amphetamines: NOT DETECTED
Barbiturates: NOT DETECTED
Benzodiazepines: NOT DETECTED
Cocaine: NOT DETECTED

## 2012-05-15 LAB — URINE MICROSCOPIC-ADD ON

## 2012-05-15 LAB — CBC
HCT: 31.2 % — ABNORMAL LOW (ref 36.0–46.0)
MCH: 31.4 pg (ref 26.0–34.0)
MCHC: 34.6 g/dL (ref 30.0–36.0)
MCV: 90.7 fL (ref 78.0–100.0)
Platelets: 291 10*3/uL (ref 150–400)
RDW: 14.5 % (ref 11.5–15.5)

## 2012-05-15 LAB — DIFFERENTIAL
Basophils Absolute: 0 10*3/uL (ref 0.0–0.1)
Basophils Relative: 0 % (ref 0–1)
Eosinophils Absolute: 0.1 10*3/uL (ref 0.0–0.7)
Eosinophils Relative: 1 % (ref 0–5)
Lymphocytes Relative: 24 % (ref 12–46)
Monocytes Absolute: 0.7 10*3/uL (ref 0.1–1.0)

## 2012-05-15 LAB — URINALYSIS, ROUTINE W REFLEX MICROSCOPIC
Bilirubin Urine: NEGATIVE
Glucose, UA: NEGATIVE mg/dL
pH: 6.5 (ref 5.0–8.0)

## 2012-05-15 LAB — TYPE AND SCREEN: Antibody Screen: NEGATIVE

## 2012-05-15 LAB — OCCULT BLOOD, POC DEVICE: Fecal Occult Bld: NEGATIVE

## 2012-05-15 LAB — PROTIME-INR: Prothrombin Time: 13.8 seconds (ref 11.6–15.2)

## 2012-05-15 MED ORDER — SENNOSIDES-DOCUSATE SODIUM 8.6-50 MG PO TABS: 1.0000 | ORAL_TABLET | Freq: Every evening | ORAL | Status: DC | PRN

## 2012-05-15 MED ORDER — ATORVASTATIN CALCIUM 10 MG PO TABS
10.0000 mg | ORAL_TABLET | Freq: Every morning | ORAL | Status: DC
  Administered 2012-05-16 – 2012-05-18 (×3): 10 mg via ORAL
  Filled 2012-05-15 (×3): qty 1

## 2012-05-15 MED ORDER — SODIUM CHLORIDE 0.9 % IV SOLN
INTRAVENOUS | Status: DC
  Administered 2012-05-15 – 2012-05-17 (×3): via INTRAVENOUS

## 2012-05-15 MED ORDER — ACETAMINOPHEN 325 MG PO TABS
650.0000 mg | ORAL_TABLET | Freq: Once | ORAL | Status: AC
  Administered 2012-05-15: 650 mg via ORAL
  Filled 2012-05-15: qty 2

## 2012-05-15 MED ORDER — LEVETIRACETAM 500 MG PO TABS
500.0000 mg | ORAL_TABLET | Freq: Two times a day (BID) | ORAL | Status: DC
  Administered 2012-05-15 – 2012-05-18 (×6): 500 mg via ORAL
  Filled 2012-05-15 (×6): qty 1

## 2012-05-15 MED ORDER — CHOLESTYRAMINE POWD: 2.0000 g | Freq: Every day | Status: DC

## 2012-05-15 MED ORDER — ASPIRIN 300 MG RE SUPP
300.0000 mg | Freq: Every day | RECTAL | Status: DC
  Filled 2012-05-15 (×10): qty 1

## 2012-05-15 MED ORDER — INSULIN ASPART 100 UNIT/ML ~~LOC~~ SOLN
0.0000 [IU] | Freq: Three times a day (TID) | SUBCUTANEOUS | Status: DC
  Administered 2012-05-16: 2 [IU] via SUBCUTANEOUS
  Administered 2012-05-16: 1 [IU] via SUBCUTANEOUS
  Administered 2012-05-16: 2 [IU] via SUBCUTANEOUS
  Administered 2012-05-17 (×2): 3 [IU] via SUBCUTANEOUS
  Administered 2012-05-17 – 2012-05-18 (×2): 2 [IU] via SUBCUTANEOUS
  Administered 2012-05-18: 5 [IU] via SUBCUTANEOUS

## 2012-05-15 MED ORDER — INSULIN ASPART 100 UNIT/ML ~~LOC~~ SOLN
0.0000 [IU] | Freq: Every day | SUBCUTANEOUS | Status: DC
  Administered 2012-05-17: 2 [IU] via SUBCUTANEOUS

## 2012-05-15 MED ORDER — METOPROLOL TARTRATE 25 MG PO TABS
12.5000 mg | ORAL_TABLET | Freq: Two times a day (BID) | ORAL | Status: DC
  Administered 2012-05-15 – 2012-05-18 (×6): 12.5 mg via ORAL
  Filled 2012-05-15: qty 1
  Filled 2012-05-15: qty 2
  Filled 2012-05-15 (×4): qty 1

## 2012-05-15 MED ORDER — ENOXAPARIN SODIUM 40 MG/0.4ML ~~LOC~~ SOLN
40.0000 mg | SUBCUTANEOUS | Status: DC
  Administered 2012-05-15 – 2012-05-17 (×3): 40 mg via SUBCUTANEOUS
  Filled 2012-05-15 (×3): qty 0.4

## 2012-05-15 MED ORDER — ACETAMINOPHEN 500 MG PO TABS: 500.0000 mg | ORAL_TABLET | Freq: Four times a day (QID) | ORAL | Status: DC | PRN

## 2012-05-15 MED ORDER — ASPIRIN 325 MG PO TABS
325.0000 mg | ORAL_TABLET | Freq: Every day | ORAL | Status: DC
  Administered 2012-05-15 – 2012-05-18 (×4): 325 mg via ORAL
  Filled 2012-05-15 (×4): qty 1

## 2012-05-15 MED ORDER — ONDANSETRON HCL 4 MG/2ML IJ SOLN: 4.0000 mg | Freq: Four times a day (QID) | INTRAMUSCULAR | Status: DC | PRN

## 2012-05-15 MED ORDER — FOLIC ACID 1 MG PO TABS
1.0000 mg | ORAL_TABLET | Freq: Every morning | ORAL | Status: DC
Start: 2012-05-16 — End: 2012-05-18
  Administered 2012-05-16 – 2012-05-18 (×3): 1 mg via ORAL
  Filled 2012-05-15 (×3): qty 1

## 2012-05-15 MED ORDER — DONEPEZIL HCL 5 MG PO TABS
10.0000 mg | ORAL_TABLET | Freq: Every morning | ORAL | Status: DC
  Administered 2012-05-16 – 2012-05-18 (×3): 10 mg via ORAL
  Filled 2012-05-15 (×3): qty 2

## 2012-05-15 NOTE — H&P (Signed)
[facility] History and Physical  Helen [name]  WJX:[id number]  DOB: 05-20-1924   DOA: 05/15/2012   PCP:   Milinda Antis, MD   Chief Complaint:  Altered mental status since today  HPI: Helen [name] is an 79 y.o. female.   Elderly Caucasian lady, with paroxysmal atrial fibrillation, and a past history of stroke and recurrent TIAs, maintained on warfarin therapy for many years, was hospitalized from April 1-4, 2014, for lower GI bleed following colonoscopy and polypectomy,  and was due to restart her warfarin on April 10.   Today, has has happened on previous occasions when her INR has dropped below therapeutic levels, she developed symptoms typical of previous strokes, including lethargy, left-sided weakness and slurred speech. There is no history of visual disturbance. Although she does take Aricept and has a history of dementia, some reports is her baseline she is alert oriented and very sharp.  She has no history of fall or head trauma  Rewiew of Systems:  Patient is too drowsy to assist with review of systems but according to her family,  All systems negative except as marked bold or noted in the HPI;  Constitutional:    malaise, fever and chills. ;  Eyes:   eye pain, redness and discharge. ;  ENMT:   ear pain, hoarseness, nasal congestion, sinus pressure and sore throat. ;  Cardiovascular:    chest pain, palpitations, diaphoresis, dyspnea and peripheral edema on and off for 2 weeks.  Respiratory:   cough, hemoptysis, wheezing and stridor. ;  Gastrointestinal:  nausea, vomiting, diarrhea, constipation, abdominal pain, melena, blood in stool, hematemesis, jaundice and rectal bleeding. unusual weight loss..   Genitourinary:    frequency, dysuria, incontinence,flank pain and hematuria; Musculoskeletal:   back pain and neck pain.  swelling and trauma.;  Skin: .  pruritus, rash, abrasions, bruising and skin lesion.; ulcerations Neuro:    headache, lightheadedness and neck  stiffness.  weakness, altered level of consciousness, altered mental status, extremity weakness, burning feet, involuntary movement, seizure and syncope.  Psych:    anxiety, depression, insomnia, tearfulness, panic attacks, hallucinations, paranoia, suicidal or homicidal ideation    Past Medical History  Diagnosis Date  . Stroke   . Atrial fibrillation   . Diabetes mellitus   . Hypertension   . Coronary artery disease   . Hyperlipidemia 05/25/2011  . Anemia 05/25/2011  . Seizures     Noted at [facility] in Jan 2012, started on Keppra  . Depression   . Arthritis     Past Surgical History  Procedure Laterality Date  . Appendectomy    . Abdominal hysterectomy    . Back surgery  1990s  . Colonoscopy  01/29/2004    [facility]:  rectal polyp-cannot remember?path  . Colonoscopy N/A 05/03/2012    RMR: multiple colonic polyps, s/p piecemeal polypectomy, diverticulosis, fragments of tubular adenomas    Medications:  HOME MEDS: Prior to Admission medications   Medication Sig Start Date End Date Taking? Authorizing Provider  acetaminophen (TYLENOL) 500 MG tablet Take 500 mg by mouth 2 (two) times daily as needed for pain.   Yes Historical Provider, MD  atorvastatin (LIPITOR) 10 MG tablet Take 10 mg by mouth every morning.  03/28/12  Yes Salley Scarlet, MD  diphenoxylate-atropine (LOMOTIL) 2.5-0.025 MG per tablet Take 1 tablet by mouth 4 (four) times daily as needed for diarrhea or loose stools.   Yes Historical Provider, MD  donepezil (ARICEPT) 10 MG tablet Take 10 mg by mouth every  morning.    Yes Historical Provider, MD  escitalopram (LEXAPRO) 5 MG tablet Take 5 mg by mouth daily.   Yes Historical Provider, MD  ferrous sulfate 325 (65 FE) MG tablet Take 1 tablet (325 mg total) by mouth daily with breakfast. 05/26/11 05/25/12 Yes Elliot Cousin, MD  fesoterodine (TOVIAZ) 8 MG TB24 Take 8 mg by mouth every morning.    Yes Historical Provider, MD  folic acid (FOLVITE) 1 MG tablet Take 1 mg by  mouth every morning. At 700   Yes Historical Provider, MD  glipiZIDE (GLUCOTROL) 5 MG tablet Take 5 mg by mouth every morning.    Yes Historical Provider, MD  levETIRAcetam (KEPPRA) 500 MG tablet Take 500 mg by mouth 2 (two) times daily.   Yes Historical Provider, MD  lisinopril (PRINIVIL,ZESTRIL) 10 MG tablet Take 10 mg by mouth every morning.    Yes Historical Provider, MD  metoprolol tartrate (LOPRESSOR) 25 MG tablet Take 12.5 mg by mouth 2 (two) times daily.   Yes Historical Provider, MD  mirabegron ER (MYRBETRIQ) 50 MG TB24 Take 50 mg by mouth every morning.    Yes Historical Provider, MD  Multiple Vitamins-Minerals (CENTRUM SILVER ULTRA WOMENS PO) Take 1 tablet by mouth every morning. At 700   Yes Historical Provider, MD  potassium chloride (K-DUR) 10 MEQ tablet Take 10 mEq by mouth 2 (two) times daily. 03/28/12  Yes Salley Scarlet, MD  zolpidem (AMBIEN) 5 MG tablet Take 5 mg by mouth at bedtime as needed for sleep. 11/21/11  Yes Salley Scarlet, MD  protective barrier (RESTORE) CREA Apply 1 application topically daily.    Historical Provider, MD     Allergies:  Allergies  Allergen Reactions  . Ciprofloxacin     Patient states that she may have had a stroke due to this medication  . Niacin And Related Hives  . Statins Other (See Comments)    Some but not all     Social History:   reports that she has never smoked. She does not have any smokeless tobacco history on file. She reports that she does not drink alcohol or use illicit drugs.  Family History: Family History  Problem Relation Age of Onset  . Heart disease Father      Physical Exam: Filed Vitals:   05/15/12 1732 05/15/12 1801 05/15/12 1856 05/15/12 2048  BP: 176/66 172/63 192/75 169/77  Pulse: 66 70 72 70  Temp:   98.2 F (36.8 C) 99.1 F (37.3 C)  TempSrc:   Oral Oral  Resp: 18 20 20 20   SpO2: 97%  95% 97%   Blood pressure 169/77, pulse 70, temperature 99.1 F (37.3 C), temperature source Oral, resp. rate  20, SpO2 97.00%.  GEN:  Pleasant  easily arousable Caucasian lady lying bed in no acute distress; cooperative with exam PSYCH:  alert and oriented x1;  affect is appropriate. HEENT: Mucous membranes pink and anicteric; PERRLA; EOM intact; no cervical lymphadenopathy nor thyromegaly or carotid bruit; no JVD; Breasts:: Not examined CHEST WALL: No tenderness CHEST: Normal respiration, clear to auscultation bilaterally HEART: Regular rate and rhythm;  1/6 systolic rubs or gallop  BACK:  no scoliosis; no CVA tenderness ABDOMEN: Obese, soft non-tender; no masses, no organomegaly, normal abdominal bowel sounds; no intertriginous candida. Rectal Exam: Not done EXTREMITIES:  age-appropriate arthropathy of the hands and knees; no edema; no ulcerations. Genitalia: not examined PULSES: 2+ and symmetric SKIN: Normal hydration no rash or ulceration CNS:  Mild left-sided facial weakness; a  left pronator drift; strength in the left lower extremity 3/5 compared to 5 over 5 on the right   Labs on Admission:  Basic Metabolic Panel:  Recent Labs Lab 05/09/12 0457 05/11/12 0529 05/15/12 1637  NA 139 135 131*  K 3.8 3.8 3.9  CL 106 101 94*  CO2 22 23 25   GLUCOSE 156* 178* 120*  BUN 11 10 11   CREATININE 0.73 0.82 0.94  CALCIUM 8.8 8.4 9.4   Liver Function Tests:  Recent Labs Lab 05/15/12 1637  AST 19  ALT 23  ALKPHOS 99  BILITOT 0.2*  PROT 7.2  ALBUMIN 3.7   No results found for this basename: LIPASE, AMYLASE,  in the last 168 hours No results found for this basename: AMMONIA,  in the last 168 hours CBC:  Recent Labs Lab 05/09/12 0840 05/09/12 1655 05/10/12 0038 05/11/12 0529 05/15/12 1637  WBC 12.3* 12.4* 10.1 9.3 8.7  NEUTROABS  --   --   --   --  5.8  HGB 11.4* 10.1* 9.7* 9.5* 10.8*  HCT 32.2* 28.7* 28.1* 27.6* 31.2*  MCV 88.7 88.0 89.2 89.9 90.7  PLT 150 139* 64* 150 291   Cardiac Enzymes:  Recent Labs Lab 05/15/12 1637  TROPONINI <0.30   BNP: No components found  with this basename: POCBNP,  D-dimer: No components found with this basename: D-DIMER,  CBG:  Recent Labs Lab 05/10/12 1601 05/10/12 2215 05/11/12 0735 05/11/12 1109 05/15/12 1639  GLUCAP 144* 195* 175* 214* 121*    Radiological Exams on Admission: Ct Head Wo Contrast  05/15/2012  *RADIOLOGY REPORT*  Clinical Data: Weakness.  Diabetes, hypertension, prior CVA and seizures.  CT HEAD WITHOUT CONTRAST  Technique:  Contiguous axial images were obtained from the base of the skull through the vertex without contrast.  Comparison: Head CT 07/30/2011  Findings: Some of the images were repeated due to patient motion.  There is stable, moderate chronic small vessel ischemic change in the periventricular white matter bilaterally.  No acute intracranial abnormality is identified.  Specifically, no hemorrhage, hydrocephalus, mass lesion, acute cortically based infarction or midline shift is identified.  The visualized paranasal sinuses, mastoid air cells, and middle ears are clear.  The skull is intact.  IMPRESSION: No acute intracranial abnormality.  Stable chronic microvascular ischemic changes.  This report was telephoned to Dr. Bebe Shaggy at 4:58 p.m. 05/15/2012.   Original Report Authenticated By: Britta Mccreedy, M.D.    Dg Chest Portable 1 View  05/15/2012  *RADIOLOGY REPORT*  Clinical Data: Weakness, confusion and slurred speech.  PORTABLE CHEST - 1 VIEW  Comparison: 07/30/2011  Findings: Stable mild cardiomegaly and aortic tortuosity.  No edema, infiltrate or pleural fluid identified.  Visualized bony structures are unremarkable.  IMPRESSION: No active disease.  Stable cardiomegaly.   Original Report Authenticated By: Irish Lack, M.D.     EKG: Independently reviewed. Normal sinus rhythm   Assessment/Plan Present on Admission:  . Stroke, embolic . Seizure disorder . Paroxysmal atrial fibrillation . DM type 2 (diabetes mellitus, type 2) . Hyperlipidemia . Dementia . Anemia . HTN  (hypertension)   PLAN: Patient is improving since admission she is becoming more alert, and this may turn out to be a TIA but will mature on the acute embolic stroke protocol; will not restart anticoagulations until we can assess the size of the stroke; will consult the neurologist; because patient had placement of colonic clips for bleeding recently, he need to be imaged to see that these clips the past before MRI  can be done. We'll also defer carotid Dopplers. Patient's CODE STATUS has been confirmed as DO NOT RESUSCITATE  Since patient is alert and likely to pass swallowing screen, once this is confirmed will start him on diabetic diet, but will still have speech evaluation in the morning  Other plans as per orders.  Code Status: DNR Family Communication:  Home caregiver, granddaughter, and Son Claiborne Rigg, phone [phone], at bedside for discussion of plans Disposition Plan: Likely back home with physical therapy    Keith Felten Nocturnist [facility] Pager [phone]   05/15/2012, 9:13 PM

## 2012-05-15 NOTE — ED Notes (Signed)
Pt voided when depends removed,  Did not attempt cath urine.  NOtified Abby RN on 300.

## 2012-05-15 NOTE — ED Notes (Signed)
teleneurology performed assessment.  Family at bedside.  VSS.

## 2012-05-15 NOTE — ED Notes (Signed)
Slurred speech, headache, and generalized weakness.   confusion

## 2012-05-15 NOTE — Telephone Encounter (Signed)
Attempted to reach patient but no answer.  No ability to leave msg.

## 2012-05-15 NOTE — Telephone Encounter (Signed)
reminder made for f/uwith rmr only

## 2012-05-15 NOTE — ED Provider Notes (Addendum)
History  This chart was scribed for Helen Gaskins, MD, by Candelaria Stagers, ED Scribe. This patient was seen in room APA05/APA05 and the patient's care was started at 4:36 PM   CSN: [id number]  Arrival date & time 05/15/12  1621   First MD Initiated Contact with Patient 05/15/12 1627      Chief Complaint  Patient presents with  . Code Stroke   CODE STROKE  The history is provided by a relative. No language interpreter was used.   Helen [name] is a 79 y.o. female who presents to the Emergency Department complaining of sudden onset of confusion, slurred speech, and weakness that her family noticed about 1.5 hours ago.  He reports she was normal at 3pm today.  He denies more weakness to one side.  Pt is currently off all blood thinners.  She has experienced no recent head injury.  Pt was admitted to the hospital for rectal bleeding on 4/1 and was discharged on 4/4.   Nothing improves symptoms Nothing worsens symptoms  Past Medical History  Diagnosis Date  . Stroke   . Atrial fibrillation   . Diabetes mellitus   . Hypertension   . Coronary artery disease   . Hyperlipidemia 05/25/2011  . Anemia 05/25/2011  . Seizures     Noted at [facility] in Jan 2012, started on Keppra  . Depression   . Arthritis     Past Surgical History  Procedure Laterality Date  . Appendectomy    . Abdominal hysterectomy    . Back surgery  1990s  . Colonoscopy  01/29/2004    [facility]:  rectal polyp-cannot remember?path  . Colonoscopy N/A 05/03/2012    RMR: multiple colonic polyps, s/p piecemeal polypectomy, diverticulosis, fragments of tubular adenomas    Family History  Problem Relation Age of Onset  . Heart disease Father     History  Substance Use Topics  . Smoking status: Never Smoker   . Smokeless tobacco: Not on file  . Alcohol Use: No    OB History   Grav Para Term Preterm Abortions TAB SAB Ect Mult Living                  Review of Systems  Unable to perform ROS: Acuity of  condition  Neurological: Positive for weakness.  Psychiatric/Behavioral: Positive for confusion.    Allergies  Ciprofloxacin; Niacin and related; and Statins  Home Medications   Current Outpatient Rx  Name  Route  Sig  Dispense  Refill  . atorvastatin (LIPITOR) 10 MG tablet   Oral   Take 10 mg by mouth daily.         . Cholestyramine POWD   Oral   Take 2 g by mouth daily.   1 Bottle   0   . diphenoxylate-atropine (LOMOTIL) 2.5-0.025 MG per tablet   Oral   Take 1 tablet by mouth 4 (four) times daily as needed for diarrhea or loose stools.         . donepezil (ARICEPT) 10 MG tablet   Oral   Take 10 mg by mouth at bedtime.         Marland Kitchen escitalopram (LEXAPRO) 5 MG tablet   Oral   Take 5 mg by mouth daily.         . ferrous sulfate 325 (65 FE) MG tablet   Oral   Take 1 tablet (325 mg total) by mouth daily with breakfast.         . fesoterodine (  TOVIAZ) 8 MG TB24   Oral   Take 8 mg by mouth daily.         . folic acid (FOLVITE) 1 MG tablet   Oral   Take 1 mg by mouth daily. At 700         . glipiZIDE (GLUCOTROL) 5 MG tablet   Oral   Take 5 mg by mouth daily.         Marland Kitchen levETIRAcetam (KEPPRA) 500 MG tablet   Oral   Take 500 mg by mouth 2 (two) times daily.         Marland Kitchen lisinopril (PRINIVIL,ZESTRIL) 10 MG tablet   Oral   Take 10 mg by mouth daily.         . metoprolol tartrate (LOPRESSOR) 25 MG tablet   Oral   Take 12.5 mg by mouth 2 (two) times daily.         . mirabegron ER (MYRBETRIQ) 50 MG TB24   Oral   Take 50 mg by mouth daily.         . Multiple Vitamins-Minerals (CENTRUM SILVER ULTRA WOMENS PO)   Oral   Take 1 tablet by mouth every morning. At 700         . potassium chloride (K-DUR) 10 MEQ tablet   Oral   Take 10 mEq by mouth 2 (two) times daily.         . protective barrier (RESTORE) CREA   Topical   Apply 1 application topically daily.         Marland Kitchen zolpidem (AMBIEN) 5 MG tablet   Oral   Take 5 mg by mouth at  bedtime as needed for sleep.           BP 180/73  Pulse 66  Temp(Src) 98.5 F (36.9 C) (Oral)  Resp 16  SpO2 94%  Physical Exam CONSTITUTIONAL: Well developed/well nourished HEAD: Normocephalic/atraumatic EYES: EOMI/PERRL ENMT: Mucous membranes moist NECK: supple no meningeal signs CV: irregular no murmurs/rubs/gallops noted LUNGS: Lungs are clear to auscultation bilaterally, no apparent distress ABDOMEN: soft, nontender, no rebound or guarding GU:no cva tenderness Rectal - stool color normal, hemoccult negative, chaperone present NEURO: Pt is awake/alert, no facial droop No arm drift.  No obvious leg drift noted Slurred speech is noted EXTREMITIES: pulses normal, full ROM SKIN: warm, color normal PSYCH: flat affect  ED Course  Procedures   DIAGNOSTIC STUDIES: Oxygen Saturation is 94% on room air, adequate by my interpretation.    COORDINATION OF CARE: Code Stroke called upon my arrival to the room  16:36 Will order CT scan and blood work.  Pt and family understand and agrees.   16:40 Pt went to CT  16:50 Consult with neurologist will see patient via teleneuro 4:58 PM Radiologist dr turner reports CT head is negative 4:58 PM teleneuro now in process 5:19 PM D/w teleneuro.  They would not recommend TPA given recent GI bleed tPA in stroke considered but not given due to:  Recent GI bleed (earlier this month), neurologist did not recommend TPA  6:19 PM D/w hospitalist, to admit to tele for possible CVA Pt apparently had metal clips in abdomen recently and may not be able to receive MRI Pt currently stable Per nursing she passed swallow screen  MDM  Nursing notes including past medical history and social history reviewed and considered in documentation Labs/vital reviewed and considered Previous records reviewed and considered - h/o recent GI bleed xrays reviewed and considered    Date: 05/15/2012  Rate: 65  Rhythm: normal sinus rhythm  QRS Axis:  left  Intervals: normal  ST/T Wave abnormalities: nonspecific ST changes  Conduction Disutrbances:nonspecific IV conduction delay  Narrative Interpretation:   Old EKG Reviewed: unchanged    I personally performed the services described in this documentation, which was scribed in my presence. The recorded information has been reviewed and is accurate.         Helen Gaskins, MD 05/15/12 1820  Helen Gaskins, MD 05/15/12 [phone]

## 2012-05-15 NOTE — ED Notes (Signed)
Code Stroke initiated. SOC called and paperwork has been faxed.  Monitor in the room, and CT called to send the Images through.

## 2012-05-16 ENCOUNTER — Inpatient Hospital Stay (HOSPITAL_COMMUNITY): Payer: Medicare Other

## 2012-05-16 ENCOUNTER — Inpatient Hospital Stay (HOSPITAL_COMMUNITY): Admit: 2012-05-16 | Discharge: 2012-05-16 | Disposition: A | Discharge status: Home / Self Care | Payer: Medicare Other

## 2012-05-16 ENCOUNTER — Ambulatory Visit (HOSPITAL_COMMUNITY): Payer: Medicare Other

## 2012-05-16 DIAGNOSIS — Z7901 Long term (current) use of anticoagulants: Secondary | ICD-10-CM

## 2012-05-16 DIAGNOSIS — I10 Essential (primary) hypertension: Secondary | ICD-10-CM

## 2012-05-16 DIAGNOSIS — I369 Nonrheumatic tricuspid valve disorder, unspecified: Secondary | ICD-10-CM

## 2012-05-16 LAB — LIPID PANEL
Cholesterol: 191 mg/dL (ref 0–200)
Total CHOL/HDL Ratio: 5 RATIO
Triglycerides: 196 mg/dL — ABNORMAL HIGH (ref <150)

## 2012-05-16 LAB — GLUCOSE, CAPILLARY
Glucose-Capillary: 148 mg/dL — ABNORMAL HIGH (ref 70–99)
Glucose-Capillary: 198 mg/dL — ABNORMAL HIGH (ref 70–99)

## 2012-05-16 LAB — VITAMIN B12: Vitamin B-12: 423 pg/mL (ref 211–911)

## 2012-05-16 LAB — HEMOGLOBIN A1C: Mean Plasma Glucose: 140 mg/dL — ABNORMAL HIGH (ref <117)

## 2012-05-16 LAB — RPR: RPR Ser Ql: NONREACTIVE

## 2012-05-16 MED ORDER — WARFARIN SODIUM 5 MG PO TABS
5.0000 mg | ORAL_TABLET | Freq: Once | ORAL | Status: AC
  Administered 2012-05-16: 5 mg via ORAL
  Filled 2012-05-16: qty 1

## 2012-05-16 MED ORDER — WARFARIN - PHARMACIST DOSING INPATIENT
Status: DC
  Administered 2012-05-16: 16:00:00

## 2012-05-16 NOTE — Consult Note (Signed)
[facility] Diana Armijo A. Gerilyn Pilgrim, MD     [email or web address]          Helen [name] is an 79 y.o. female.   ASSESSMENT/PLAN: 1. The patient presents with a spell of altered mental status and left-sided weakness. She certainly could have a new ischemic event but given her history, unwitnessed seizures also possibility. An EEG will be obtained. 2. Cognitive impairment at baseline apparently having a history of Alzheimer's type dementia. She clearly seems to be impaired today during the evaluation suggestive of baseline dementia. She is on Aricept. This should be continued.  A typical dementia workup with labs will be obtained. 3. Atrial fibrillation off warfarin therapy temporarily status post GI bleed after colonoscopy. She was to restart the warfarin therapy tomorrow. The patient can be restarted on warfarin therapy. We will repeat her scan on tomorrow and if this looks okay, I think we can restart the warfarin. For the meantime, she can continue with aspirin antiplatelet agent. 4. UTI.   The patient 79 year old white female who presents with altered mental status and left-sided weakness and has a baseline history of Alzheimer's dementia per the chart. The patient also has had atrial fibrillation and apparently has had a couple of strokes in the past. The patient herself reports that she has right-sided weakness which is in contrast to the record indicated left-sided weakness, altered mental status and confusion. She does have a baseline history of seizures. The patient tells me that she has had a stroke in 2003 and then 2013 which left her with baseline right-sided weakness. The patient has some clear cognitive impairment during the evaluation and difficulty with a history which of a slightly limits the evaluation today. Review of systems is greatly limited given the cognitive impairment. She reports no other complaints. It appears that the patient had colonoscopy and some GI bleed related  to this. She has been taken off the warfarin therapy and was to restart this on April 10 tomorrow.   GENERAL: The patient is eating breakfast. She is in no acute distress.  HEENT: unremarkable.  ABDOMEN: soft  EXTREMITIES: No edema   BACK: unremarkable.  SKIN: Normal by inspection.    MENTAL STATUS:  The patient has a paucity of speech. She repeats that she has right-sided weakness when asked about different symptoms or how she is doing. She does follow commands well. She is actually able to name 5 out of 5 objects well for the most part.  No dysarthrias observed.  CRANIAL NERVES: Pupils are equal, round and reactive to light and accommodation; extra ocular movements are full, there is no significant nystagmus; visual fields are full; upper and lower facial muscles are normal in strength and symmetric, there is no flattening of the nasolabial folds; tongue is midline; uvula is midline; shoulder elevation is normal.   MOTOR:  She has a clear left upper extremity pronation drift. There is also left upper extremity weakness with deltoids and triceps being 4/5. The left lower extremity shows normal tone, bulk and strength. The right side is normal.  COORDINATION: Left finger to nose is normal, right finger to nose is normal, No rest tremor; no intention tremor; no postural tremor; no bradykinesia.  REFLEXES: Deep tendon reflexes are symmetrical and normal. Plantar responses are equivocal bilaterally.   SENSATION: Normal to light pain.   Past Medical History  Diagnosis Date  . Stroke   . Atrial fibrillation   . Diabetes mellitus   . Hypertension   . Coronary  artery disease   . Hyperlipidemia 05/25/2011  . Anemia 05/25/2011  . Seizures     Noted at [facility] in Jan 2012, started on Keppra  . Depression   . Arthritis     Past Surgical History  Procedure Laterality Date  . Appendectomy    . Abdominal hysterectomy    . Back surgery  1990s  . Colonoscopy  01/29/2004    [facility]:   rectal polyp-cannot remember?path  . Colonoscopy N/A 05/03/2012    RMR: multiple colonic polyps, s/p piecemeal polypectomy, diverticulosis, fragments of tubular adenomas    Family History  Problem Relation Age of Onset  . Heart disease Father     Social History:  reports that she has never smoked. She does not have any smokeless tobacco history on file. She reports that she does not drink alcohol or use illicit drugs.  Allergies:  Allergies  Allergen Reactions  . Ciprofloxacin     Patient states that she may have had a stroke due to this medication  . Niacin And Related Hives  . Statins Other (See Comments)    Some but not all     Medications: Prior to Admission medications   Medication Sig Start Date End Date Taking? Authorizing Provider  acetaminophen (TYLENOL) 500 MG tablet Take 500 mg by mouth 2 (two) times daily as needed for pain.   Yes Historical Provider, MD  atorvastatin (LIPITOR) 10 MG tablet Take 10 mg by mouth every morning.  03/28/12  Yes Salley Scarlet, MD  diphenoxylate-atropine (LOMOTIL) 2.5-0.025 MG per tablet Take 1 tablet by mouth 4 (four) times daily as needed for diarrhea or loose stools.   Yes Historical Provider, MD  donepezil (ARICEPT) 10 MG tablet Take 10 mg by mouth every morning.    Yes Historical Provider, MD  escitalopram (LEXAPRO) 5 MG tablet Take 5 mg by mouth daily.   Yes Historical Provider, MD  ferrous sulfate 325 (65 FE) MG tablet Take 1 tablet (325 mg total) by mouth daily with breakfast. 05/26/11 05/25/12 Yes Elliot Cousin, MD  fesoterodine (TOVIAZ) 8 MG TB24 Take 8 mg by mouth every morning.    Yes Historical Provider, MD  folic acid (FOLVITE) 1 MG tablet Take 1 mg by mouth every morning. At 700   Yes Historical Provider, MD  glipiZIDE (GLUCOTROL) 5 MG tablet Take 5 mg by mouth every morning.    Yes Historical Provider, MD  levETIRAcetam (KEPPRA) 500 MG tablet Take 500 mg by mouth 2 (two) times daily.   Yes Historical Provider, MD  lisinopril  (PRINIVIL,ZESTRIL) 10 MG tablet Take 10 mg by mouth every morning.    Yes Historical Provider, MD  metoprolol tartrate (LOPRESSOR) 25 MG tablet Take 12.5 mg by mouth 2 (two) times daily.   Yes Historical Provider, MD  mirabegron ER (MYRBETRIQ) 50 MG TB24 Take 50 mg by mouth every morning.    Yes Historical Provider, MD  Multiple Vitamins-Minerals (CENTRUM SILVER ULTRA WOMENS PO) Take 1 tablet by mouth every morning. At 700   Yes Historical Provider, MD  potassium chloride (K-DUR) 10 MEQ tablet Take 10 mEq by mouth 2 (two) times daily. 03/28/12  Yes Salley Scarlet, MD  zolpidem (AMBIEN) 5 MG tablet Take 5 mg by mouth at bedtime as needed for sleep. 11/21/11  Yes Salley Scarlet, MD  protective barrier (RESTORE) CREA Apply 1 application topically daily.    Historical Provider, MD    Scheduled Meds: . aspirin  300 mg Rectal Daily   Or  .  aspirin  325 mg Oral Daily  . atorvastatin  10 mg Oral q morning - 10a  . donepezil  10 mg Oral q morning - 10a  . enoxaparin (LOVENOX) injection  40 mg Subcutaneous Q24H  . folic acid  1 mg Oral q morning - 10a  . insulin aspart  0-5 Units Subcutaneous QHS  . insulin aspart  0-9 Units Subcutaneous TID WC  . levETIRAcetam  500 mg Oral BID  . metoprolol tartrate  12.5 mg Oral BID   Continuous Infusions: . sodium chloride 75 mL/hr at 05/15/12 2212   PRN Meds:.acetaminophen, ondansetron (ZOFRAN) IV, senna-docusate  Blood pressure 154/85, pulse 69, temperature 98 F (36.7 C), temperature source Oral, resp. rate 20, height 5\' 6"  (1.676 m), weight 81.965 kg (180 lb 11.2 oz), SpO2 98.00%.   Results for orders placed during the hospital encounter of 05/15/12 (from the past 48 hour(s))  CBC     Status: Abnormal   Collection Time    05/15/12  4:37 PM      Result Value Range   WBC 8.7  4.0 - 10.5 K/uL   RBC 3.44 (*) 3.87 - 5.11 MIL/uL   Hemoglobin 10.8 (*) 12.0 - 15.0 g/dL   HCT 14.7 (*) 82.9 - 56.2 %   MCV 90.7  78.0 - 100.0 fL   MCH 31.4  26.0 - 34.0  pg   MCHC 34.6  30.0 - 36.0 g/dL   RDW 13.0  86.5 - 78.4 %   Platelets 291  150 - 400 K/uL  DIFFERENTIAL     Status: None   Collection Time    05/15/12  4:37 PM      Result Value Range   Neutrophils Relative 67  43 - 77 %   Neutro Abs 5.8  1.7 - 7.7 K/uL   Lymphocytes Relative 24  12 - 46 %   Lymphs Abs 2.1  0.7 - 4.0 K/uL   Monocytes Relative 8  3 - 12 %   Monocytes Absolute 0.7  0.1 - 1.0 K/uL   Eosinophils Relative 1  0 - 5 %   Eosinophils Absolute 0.1  0.0 - 0.7 K/uL   Basophils Relative 0  0 - 1 %   Basophils Absolute 0.0  0.0 - 0.1 K/uL  COMPREHENSIVE METABOLIC PANEL     Status: Abnormal   Collection Time    05/15/12  4:37 PM      Result Value Range   Sodium 131 (*) 135 - 145 mEq/L   Potassium 3.9  3.5 - 5.1 mEq/L   Chloride 94 (*) 96 - 112 mEq/L   CO2 25  19 - 32 mEq/L   Glucose, Bld 120 (*) 70 - 99 mg/dL   BUN 11  6 - 23 mg/dL   Creatinine, Ser 6.96  0.50 - 1.10 mg/dL   Calcium 9.4  8.4 - 29.5 mg/dL   Total Protein 7.2  6.0 - 8.3 g/dL   Albumin 3.7  3.5 - 5.2 g/dL   AST 19  0 - 37 U/L   ALT 23  0 - 35 U/L   Alkaline Phosphatase 99  39 - 117 U/L   Total Bilirubin 0.2 (*) 0.3 - 1.2 mg/dL   GFR calc non Af Amer 54 (*) >90 mL/min   GFR calc Af Amer 62 (*) >90 mL/min   Comment:            The eGFR has been calculated     using the CKD EPI equation.  This calculation has not been     validated in all clinical     situations.     eGFR's persistently     <90 mL/min signify     possible Chronic Kidney Disease.  TROPONIN I     Status: None   Collection Time    05/15/12  4:37 PM      Result Value Range   Troponin I <0.30  <0.30 ng/mL   Comment:            Due to the release kinetics of cTnI,     a negative result within the first hours     of the onset of symptoms does not rule out     myocardial infarction with certainty.     If myocardial infarction is still suspected,     repeat the test at appropriate intervals.  TYPE AND SCREEN     Status: None    Collection Time    05/15/12  4:37 PM      Result Value Range   ABO/RH(D) B POS     Antibody Screen NEG     Sample Expiration 05/18/2012    GLUCOSE, CAPILLARY     Status: Abnormal   Collection Time    05/15/12  4:39 PM      Result Value Range   Glucose-Capillary 121 (*) 70 - 99 mg/dL  OCCULT BLOOD, POC DEVICE     Status: None   Collection Time    05/15/12  4:55 PM      Result Value Range   Fecal Occult Bld NEGATIVE  NEGATIVE  PROTIME-INR     Status: None   Collection Time    05/15/12  5:15 PM      Result Value Range   Prothrombin Time 13.8  11.6 - 15.2 seconds   INR 1.07  0.00 - 1.49  APTT     Status: Abnormal   Collection Time    05/15/12  5:15 PM      Result Value Range   aPTT 23 (*) 24 - 37 seconds  GLUCOSE, CAPILLARY     Status: Abnormal   Collection Time    05/15/12  9:47 PM      Result Value Range   Glucose-Capillary 174 (*) 70 - 99 mg/dL  URINE RAPID DRUG SCREEN (HOSP PERFORMED)     Status: None   Collection Time    05/15/12 10:21 PM      Result Value Range   Opiates NONE DETECTED  NONE DETECTED   Cocaine NONE DETECTED  NONE DETECTED   Benzodiazepines NONE DETECTED  NONE DETECTED   Amphetamines NONE DETECTED  NONE DETECTED   Tetrahydrocannabinol NONE DETECTED  NONE DETECTED   Barbiturates NONE DETECTED  NONE DETECTED   Comment:            DRUG SCREEN FOR MEDICAL PURPOSES     ONLY.  IF CONFIRMATION IS NEEDED     FOR ANY PURPOSE, NOTIFY LAB     WITHIN 5 DAYS.                LOWEST DETECTABLE LIMITS     FOR URINE DRUG SCREEN     Drug Class       Cutoff (ng/mL)     Amphetamine      1000     Barbiturate      200     Benzodiazepine   200     Tricyclics       300  Opiates          300     Cocaine          300     THC              50  URINALYSIS, ROUTINE W REFLEX MICROSCOPIC     Status: Abnormal   Collection Time    05/15/12 10:22 PM      Result Value Range   Color, Urine YELLOW  YELLOW   APPearance HAZY (*) CLEAR   Specific Gravity, Urine 1.015  1.005  - 1.030   pH 6.5  5.0 - 8.0   Glucose, UA NEGATIVE  NEGATIVE mg/dL   Hgb urine dipstick TRACE (*) NEGATIVE   Bilirubin Urine NEGATIVE  NEGATIVE   Ketones, ur TRACE (*) NEGATIVE mg/dL   Protein, ur NEGATIVE  NEGATIVE mg/dL   Urobilinogen, UA 0.2  0.0 - 1.0 mg/dL   Nitrite NEGATIVE  NEGATIVE   Leukocytes, UA MODERATE (*) NEGATIVE  URINE MICROSCOPIC-ADD ON     Status: Abnormal   Collection Time    05/15/12 10:22 PM      Result Value Range   Squamous Epithelial / LPF RARE  RARE   WBC, UA TOO NUMEROUS TO COUNT  <3 WBC/hpf   RBC / HPF 0-2  <3 RBC/hpf   Bacteria, UA MANY (*) RARE  LIPID PANEL     Status: Abnormal   Collection Time    05/16/12  4:42 AM      Result Value Range   Cholesterol 191  0 - 200 mg/dL   Triglycerides 409 (*) <150 mg/dL   HDL 38 (*) >81 mg/dL   Total CHOL/HDL Ratio 5.0     VLDL 39  0 - 40 mg/dL   LDL Cholesterol 191 (*) 0 - 99 mg/dL   Comment:            Total Cholesterol/HDL:CHD Risk     Coronary Heart Disease Risk Table                         Men   Women      1/2 Average Risk   3.4   3.3      Average Risk       5.0   4.4      2 X Average Risk   9.6   7.1      3 X Average Risk  23.4   11.0                Use the calculated Patient Ratio     above and the CHD Risk Table     to determine the patient's CHD Risk.                ATP III CLASSIFICATION (LDL):      <100     mg/dL   Optimal      478-295  mg/dL   Near or Above                        Optimal      130-159  mg/dL   Borderline      621-308  mg/dL   High      >657     mg/dL   Very High  GLUCOSE, CAPILLARY     Status: Abnormal   Collection Time    05/16/12  7:43 AM      Result Value Range  Glucose-Capillary 148 (*) 70 - 99 mg/dL   Comment 1 Notify RN      Ct Head Wo Contrast  05/15/2012  *RADIOLOGY REPORT*  Clinical Data: Weakness.  Diabetes, hypertension, prior CVA and seizures.  CT HEAD WITHOUT CONTRAST  Technique:  Contiguous axial images were obtained from the base of the skull through  the vertex without contrast.  Comparison: Head CT 07/30/2011  Findings: Some of the images were repeated due to patient motion.  There is stable, moderate chronic small vessel ischemic change in the periventricular white matter bilaterally.  No acute intracranial abnormality is identified.  Specifically, no hemorrhage, hydrocephalus, mass lesion, acute cortically based infarction or midline shift is identified.  The visualized paranasal sinuses, mastoid air cells, and middle ears are clear.  The skull is intact.  IMPRESSION: No acute intracranial abnormality.  Stable chronic microvascular ischemic changes.  This report was telephoned to Dr. Bebe Shaggy at 4:58 p.m. 05/15/2012.   Original Report Authenticated By: Britta Mccreedy, M.D.    Dg Chest Portable 1 View  05/15/2012  *RADIOLOGY REPORT*  Clinical Data: Weakness, confusion and slurred speech.  PORTABLE CHEST - 1 VIEW  Comparison: 07/30/2011  Findings: Stable mild cardiomegaly and aortic tortuosity.  No edema, infiltrate or pleural fluid identified.  Visualized bony structures are unremarkable.  IMPRESSION: No active disease.  Stable cardiomegaly.   Original Report Authenticated By: Irish Lack, M.D.         Mazi Brailsford A. Gerilyn Pilgrim, M.D.  Diplomate, Biomedical engineer of Psychiatry and Neurology ( Neurology). 05/16/2012, 8:33 AM

## 2012-05-16 NOTE — Progress Notes (Signed)
UR Chart Review Completed  

## 2012-05-16 NOTE — Evaluation (Signed)
Occupational Therapy Evaluation Patient Details Name: Helen [name] MRN: [id number] DOB: 07-20-1924 Today's Date: 05/16/2012 Time: 7829-5621 OT Time Calculation (min): 29 min  OT Assessment / Plan / Recommendation Clinical Impression  Patient is a 79 y/o female s/p possible CVA presenting to acute OT with deficits below. Patient will benefit from OT services to increase ADL performance, functional transfers, and BUE strength and endurance. Recommend Home health OT at D/C.    OT Assessment  Patient needs continued OT Services    Follow Up Recommendations  Home health OT    Barriers to Discharge None    Equipment Recommendations  None recommended by OT       Frequency  Min 2X/week    Precautions / Restrictions Precautions Precautions: None Restrictions Weight Bearing Restrictions: No   Pertinent Vitals/Pain No complaints.    ADL  Eating/Feeding: Performed;Set up Where Assessed - Eating/Feeding: Chair Lower Body Dressing: Performed;+1 Total assistance Toilet Transfer: Performed;Minimal assistance Toilet Transfer Method: Stand pivot Toilet Transfer Equipment:  (to recliner) Equipment Used: Rolling walker;Gait belt Transfers/Ambulation Related to ADLs: Patient transfers at [employer] with RW.    OT Diagnosis: Generalized weakness  OT Problem List: Decreased strength;Decreased activity tolerance;Impaired balance (sitting and/or standing);Decreased knowledge of use of DME or AE;Decreased safety awareness OT Treatment Interventions: Self-care/ADL training;Therapeutic activities;Therapeutic exercise;DME and/or AE instruction;Patient/family education;Balance training;Manual therapy;Modalities   OT Goals Acute Rehab OT Goals OT Goal Formulation: With patient Time For Goal Achievement: 05/30/12 Potential to Achieve Goals: Good ADL Goals Pt Will Perform Upper Body Bathing: with set-up ADL Goal: Upper Body Bathing - Progress: Goal set today Pt Will Perform Lower Body Bathing:  with mod assist ADL Goal: Lower Body Bathing - Progress: Goal set today Pt Will Perform Upper Body Dressing: with set-up ADL Goal: Upper Body Dressing - Progress: Goal set today Pt Will Perform Lower Body Dressing: with mod assist ADL Goal: Lower Body Dressing - Progress: Goal set today Pt Will Transfer to Toilet: with supervision ADL Goal: Toilet Transfer - Progress: Goal set today Arm Goals Pt Will Complete Theraband Exer: with supervision, verbal cues required/provided;to increase strength;Bilateral upper extremities;1 set;15 reps;Level 2 Theraband Arm Goal: Theraband Exercises - Progress: Goal set today  Visit Information  Last OT Received On: 05/16/12 Assistance Needed: +1    Subjective Data  Subjective: S: I don't have to use the bathroom. Patient Stated Goal: To go home.   Prior Functioning     Home Living Lives With: Son Available Help at Discharge: Family;Personal care attendant Type of Home: House Home Access: Ramped entrance Home Layout: One level Bathroom Shower/Tub: Health visitor: Standard Home Adaptive Equipment: Wheelchair - manual;Shower chair with back;Walker - rolling;Bedside commode/3-in-1 Prior Function Level of Independence: Needs assistance Needs Assistance: Bathing;Dressing;Light Housekeeping;Meal Prep Bath: Moderate Dressing: Moderate Meal Prep: Total Light Housekeeping: Total Able to Take Stairs?: No Driving: No Vocation: Retired Musician: No difficulties Dominant Hand: Right         Vision/Perception Vision - History Baseline Vision: No visual deficits Patient Visual Report: No change from baseline Vision - Assessment Eye Alignment: Within Functional Limits Vision Assessment: Vision not tested Perception Perception: Within Functional Limits Praxis Praxis: Intact   Cognition  Cognition Overall Cognitive Status: Appears within functional limits for tasks assessed/performed Arousal/Alertness:  Awake/alert Orientation Level: Appears intact for tasks assessed Behavior During Session: [facility] for tasks performed    Extremity/Trunk Assessment Right Upper Extremity Assessment RUE ROM/Strength/Tone: Deficits RUE ROM/Strength/Tone Deficits: A/ROM WFL in all ranges. Shoulder flexion: 3+/5, ABD,  elbow flexion/extension: 4/5 RUE Sensation: WFL - Light Touch;WFL - Proprioception RUE Coordination: WFL - gross/fine motor Left Upper Extremity Assessment LUE ROM/Strength/Tone: Deficits LUE ROM/Strength/Tone Deficits: A/ROM WFL in all ranges. Shoulder flexion: 3+/5, ABD, elbow flexion/extension: 4/5 LUE Sensation: WFL - Light Touch LUE Coordination: WFL - gross/fine motor     Mobility Bed Mobility Bed Mobility: Supine to Sit;Sitting - Scoot to Edge of Bed Supine to Sit: 3: Mod assist;HOB elevated Sitting - Scoot to Edge of Bed: 4: Min guard Transfers Transfers: Stand to Sit;Sit to Stand Sit to Stand: 4: Min assist;With upper extremity assist Stand to Sit: 4: Min assist;With upper extremity assist Details for Transfer Assistance: Needed max vc's for techniques, hand placement and safety awareness during transfer.           End of Session OT - End of Session Equipment Utilized During Treatment: Gait belt Activity Tolerance: Patient tolerated treatment well Patient left: in chair;with call bell/phone within reach;with chair alarm set;with nursing in room Nurse Communication: Mobility status    Limmie Patricia, OTR/L,CBIS   05/16/2012, 10:41 AM

## 2012-05-16 NOTE — Evaluation (Signed)
Physical Therapy Evaluation Patient Details Name: Helen [name] MRN: [id number] DOB: [birth year] Today's Date: 05/16/2012 Time: 0850-0910 PT Time Calculation (min): 20 min  PT Assessment / Plan / Recommendation Clinical Impression  Pt is well known to this therapist.  Pt states when she went home she continued to just transfer from w/c to bed/commode.... and not ambulate due to her knee pain.  Pt will be seen by skilled therapy as pt normallly can transfer I and now needs min assist.      PT Assessment  Patient needs continued PT services    Follow Up Recommendations  No PT follow up    Does the patient have the potential to tolerate intense rehabilitation    no  Barriers to Discharge None      Equipment Recommendations  None recommended by PT    Recommendations for Other Services   none  Frequency Min 3X/week    Precautions / Restrictions Precautions Precautions: None Restrictions Weight Bearing Restrictions: No   Pertinent Vitals/Pain 0/10      Mobility  Bed Mobility Supine to Sit: 4: Min assist Transfers Stand Pivot Transfers: 4: Min assist    Exercises General Exercises - Lower Extremity Ankle Circles/Pumps: AROM;Both;10 reps Quad Sets: AROM;Both;10 reps Gluteal Sets: AROM;Both;10 reps Long Arc Quad: AROM;Both;10 reps Heel Slides: AROM;Both;5 reps Hip ABduction/ADduction: AROM;Both;10 reps Straight Leg Raises: AROM;Both;10 reps   PT Diagnosis: Generalized weakness  PT Problem List: Decreased mobility PT Treatment Interventions: Gait training;Therapeutic activities;Therapeutic exercise   PT Goals Acute Rehab PT Goals PT Goal Formulation: With patient Time For Goal Achievement: 05/18/12 Potential to Achieve Goals: Good Pt will go Sit to Supine/Side: with modified independence PT Goal: Sit to Supine/Side - Progress: Goal set today Pt will go Sit to Stand: with modified independence PT Goal: Sit to Stand - Progress: Goal set today Pt will Transfer Bed  to Chair/Chair to Bed: with modified independence PT Transfer Goal: Bed to Chair/Chair to Bed - Progress: Goal set today  Visit Information  Last PT Received On: 05/16/12 Assistance Needed: +1    Subjective Data  Subjective: Pt states that she feels pretty good today Patient Stated Goal: none stated   Prior Functioning  Home Living Lives With: Son Available Help at Discharge: Family;Personal care attendant Type of Home: House Home Access: Ramped entrance Home Layout: One level Bathroom Shower/Tub: Health visitor: Standard Home Adaptive Equipment: Wheelchair - manual;Shower chair with back;Walker - rolling Prior Function Level of Independence: Needs assistance Needs Assistance: Bathing;Dressing;Light Housekeeping;Meal Prep Bath: Moderate Dressing: Moderate Meal Prep: Total Light Housekeeping: Total Able to Take Stairs?: No Driving: No Vocation: Retired Musician: No difficulties Dominant Hand: Right    Cognition  Cognition Overall Cognitive Status: Appears within functional limits for tasks assessed/performed Arousal/Alertness: Awake/alert Orientation Level: Appears intact for tasks assessed Behavior During Session: [facility] for tasks performed    Extremity/Trunk Assessment Right Lower Extremity Assessment RLE ROM/Strength/Tone: [facility] for tasks assessed Left Lower Extremity Assessment LLE ROM/Strength/Tone: [facility] for tasks assessed   Balance    End of Session PT - End of Session Equipment Utilized During Treatment: Gait belt Patient left: in chair;with call bell/phone within reach;with chair alarm set  GP     RUSSELL,CINDY 05/16/2012, 9:17 AM

## 2012-05-16 NOTE — Care Management Note (Signed)
    Page 1 of 1   05/18/2012     12:26:48 PM   CARE MANAGEMENT NOTE 05/18/2012  Patient:  Helen [name], Helen [name]   Account Number:  [id number]  Date Initiated:  05/16/2012  Documentation initiated by:  Rosemary Holms  Subjective/Objective Assessment:   Pt admitted from home. Pt has [employer] aide 4hrs d x 5 days. Has a caregiver during the evening. Denies being in the hospital recently.     Action/Plan:   Anticipated DC Date:     Anticipated DC Plan:  HOME/SELF CARE      DC Planning Services  CM consult      Choice offered to / List presented to:             Status of service:  Completed, signed off Medicare Important Message given?  YES (If response is "NO", the following Medicare IM given date fields will be blank) Date Medicare IM given:  05/18/2012 Date Additional Medicare IM given:    Discharge Disposition:  HOME/SELF CARE  Per UR Regulation:    If discussed at Long Length of Stay Meetings, dates discussed:    Comments:  05/18/12 1223 Arlyss Queen, RN BSN CM Pt discharged home today. Pt has walker and has a machine that checks her own PT/INR and calls results to [facility]. No other CM needs noted.  05/16/12 Amy Leanord Hawking RN BSN CM

## 2012-05-16 NOTE — Progress Notes (Signed)
ANTICOAGULATION CONSULT NOTE - Initial Consult  Pharmacy Consult for Coumadin Indication: atrial fibrillation  Allergies  Allergen Reactions  . Ciprofloxacin     Patient states that she may have had a stroke due to this medication  . Niacin And Related Hives  . Statins Other (See Comments)    Some but not all    Patient Measurements: Height: 5\' 6"  (167.6 cm) Weight: 180 lb 11.2 oz (81.965 kg) IBW/kg (Calculated) : 59.3  Vital Signs: Temp: 98 F (36.7 C) (04/09 0608) Temp src: Oral (04/09 0608) BP: 154/85 mmHg (04/09 0608) Pulse Rate: 69 (04/09 0608)  Labs:  Recent Labs  05/15/12 1637 05/15/12 1715  HGB 10.8*  --   HCT 31.2*  --   PLT 291  --   APTT  --  23*  LABPROT  --  13.8  INR  --  1.07  CREATININE 0.94  --   TROPONINI <0.30  --    Estimated Creatinine Clearance: 47.2 ml/min (by C-G formula based on Cr of 0.94).  Medical History: Past Medical History  Diagnosis Date  . Stroke   . Atrial fibrillation   . Diabetes mellitus   . Hypertension   . Coronary artery disease   . Hyperlipidemia 05/25/2011  . Anemia 05/25/2011  . Seizures     Noted at [facility] in Jan 2012, started on Keppra  . Depression   . Arthritis    Medications:  Scheduled:  . [COMPLETED] acetaminophen  650 mg Oral Once  . aspirin  300 mg Rectal Daily   Or  . aspirin  325 mg Oral Daily  . atorvastatin  10 mg Oral q morning - 10a  . donepezil  10 mg Oral q morning - 10a  . enoxaparin (LOVENOX) injection  40 mg Subcutaneous Q24H  . folic acid  1 mg Oral q morning - 10a  . insulin aspart  0-5 Units Subcutaneous QHS  . insulin aspart  0-9 Units Subcutaneous TID WC  . levETIRAcetam  500 mg Oral BID  . metoprolol tartrate  12.5 mg Oral BID  . warfarin  5 mg Oral Once  . Warfarin - Pharmacist Dosing Inpatient   Does not apply Q24H    Assessment: 79yo female with afib.  CT scan of head was negative for acute CVA.  Plan is to cautiously restart Coumadin (was previously on Coumadin per MD but  is not listed on PTA list).  Baseline INR is 1.07.  Pt is on Lovenox for VTE px.  Goal of Therapy:  INR 2-3 Monitor platelets by anticoagulation protocol: Yes   Plan:  Coumadin 5mg  PO today x 1 INR daily  Valrie Hart A 05/16/2012,12:54 PM

## 2012-05-16 NOTE — Progress Notes (Signed)
*  PRELIMINARY RESULTS* Echocardiogram 2D Echocardiogram has been performed.  Helen [name] 05/16/2012, 2:17 PM

## 2012-05-16 NOTE — Progress Notes (Signed)
Subjective: This 79 year old lady, who has underlying Alzheimer's disease probably also with vascular dementia, presents once again with symptoms related to a CVA or TIA. She presented apparently with slurred speech and possibly left arm weakness. This morning, when I saw her, it appears that her speech is back to normal and there is no real weakness in the left arm. CT scan of the head was negative for acute CVA. She was not a candidate for TPA because she recently had a significant GI bleed.           Physical Exam: Blood pressure 154/85, pulse 69, temperature 98 F (36.7 C), temperature source Oral, resp. rate 20, height 5\' 6"  (1.676 m), weight 81.965 kg (180 lb 11.2 oz), SpO2 98.00%. Looks systemically well. No dysarthria. No facial weakness. Strength in the left and right arm is equal. Heart sounds are present and appear to be in atrial fibrillation. Lung fields are clear.   Investigations:  Recent Results (from the past 240 hour(s))  MRSA PCR SCREENING     Status: None   Collection Time    05/08/12  7:55 AM      Result Value Range Status   MRSA by PCR NEGATIVE  NEGATIVE Final   Comment:            The GeneXpert MRSA Assay (FDA     approved for NASAL specimens     only), is one component of a     comprehensive MRSA colonization     surveillance program. It is not     intended to diagnose MRSA     infection nor to guide or     monitor treatment for     MRSA infections.     Basic Metabolic Panel:  Recent Labs  09/81/19 1637  NA 131*  K 3.9  CL 94*  CO2 25  GLUCOSE 120*  BUN 11  CREATININE 0.94  CALCIUM 9.4   Liver Function Tests:  Recent Labs  05/15/12 1637  AST 19  ALT 23  ALKPHOS 99  BILITOT 0.2*  PROT 7.2  ALBUMIN 3.7     CBC:  Recent Labs  05/15/12 1637  WBC 8.7  NEUTROABS 5.8  HGB 10.8*  HCT 31.2*  MCV 90.7  PLT 291    Ct Head Wo Contrast  05/15/2012  *RADIOLOGY REPORT*  Clinical Data: Weakness.  Diabetes, hypertension,  prior CVA and seizures.  CT HEAD WITHOUT CONTRAST  Technique:  Contiguous axial images were obtained from the base of the skull through the vertex without contrast.  Comparison: Head CT 07/30/2011  Findings: Some of the images were repeated due to patient motion.  There is stable, moderate chronic small vessel ischemic change in the periventricular white matter bilaterally.  No acute intracranial abnormality is identified.  Specifically, no hemorrhage, hydrocephalus, mass lesion, acute cortically based infarction or midline shift is identified.  The visualized paranasal sinuses, mastoid air cells, and middle ears are clear.  The skull is intact.  IMPRESSION: No acute intracranial abnormality.  Stable chronic microvascular ischemic changes.  This report was telephoned to Dr. Bebe Shaggy at 4:58 p.m. 05/15/2012.   Original Report Authenticated By: Britta Mccreedy, M.D.    Dg Chest Portable 1 View  05/15/2012  *RADIOLOGY REPORT*  Clinical Data: Weakness, confusion and slurred speech.  PORTABLE CHEST - 1 VIEW  Comparison: 07/30/2011  Findings: Stable mild cardiomegaly and aortic tortuosity.  No edema, infiltrate or pleural fluid identified.  Visualized bony structures are unremarkable.  IMPRESSION:  No active disease.  Stable cardiomegaly.   Original Report Authenticated By: Irish Lack, M.D.       Medications: I have reviewed the patient's current medications.  Impression: 1. Embolic TIA versus CVA. 2. Underlying Alzheimer's disease/vascular dementia. 3. Hypertension. 4. Type 2 diabetes mellitus. 5. Hyperlipidemia. 6. Atrial fibrillation, previously on Coumadin.     Plan: 1. Restart Coumadin per pharmacy. Would not have a loading dose. 2. Appreciate neurology consultation and repeat CT scan tomorrow morning.     LOS: 1 day   Wilson Singer Pager [phone]  05/16/2012, 11:28 AM

## 2012-05-16 NOTE — Progress Notes (Signed)
OS EEG completed at [facility].

## 2012-05-17 ENCOUNTER — Inpatient Hospital Stay (HOSPITAL_COMMUNITY): Payer: Medicare Other

## 2012-05-17 ENCOUNTER — Other Ambulatory Visit (HOSPITAL_COMMUNITY): Payer: Medicare Other

## 2012-05-17 LAB — URINE CULTURE: Colony Count: >=100000

## 2012-05-17 LAB — CBC
Hemoglobin: 11 g/dL — ABNORMAL LOW (ref 12.0–15.0)
MCH: 30.8 pg (ref 26.0–34.0)
MCV: 90.2 fL (ref 78.0–100.0)
RBC: 3.57 MIL/uL — ABNORMAL LOW (ref 3.87–5.11)

## 2012-05-17 LAB — GLUCOSE, CAPILLARY: Glucose-Capillary: 213 mg/dL — ABNORMAL HIGH (ref 70–99)

## 2012-05-17 MED ORDER — WARFARIN SODIUM 7.5 MG PO TABS
7.5000 mg | ORAL_TABLET | Freq: Once | ORAL | Status: AC
  Administered 2012-05-17: 7.5 mg via ORAL
  Filled 2012-05-17: qty 1

## 2012-05-17 MED ORDER — LISINOPRIL 10 MG PO TABS
10.0000 mg | ORAL_TABLET | Freq: Every morning | ORAL | Status: DC
  Administered 2012-05-17 – 2012-05-18 (×2): 10 mg via ORAL
  Filled 2012-05-17 (×2): qty 1

## 2012-05-17 MED ORDER — DEXTROSE 5 % IV SOLN
1.0000 g | INTRAVENOUS | Status: DC
  Administered 2012-05-17 – 2012-05-18 (×2): 1 g via INTRAVENOUS
  Filled 2012-05-17 (×5): qty 10

## 2012-05-17 NOTE — Progress Notes (Signed)
ANTICOAGULATION CONSULT NOTE  Pharmacy Consult for Coumadin Indication: atrial fibrillation  Allergies  Allergen Reactions  . Ciprofloxacin     Patient states that she may have had a stroke due to this medication  . Niacin And Related Hives  . Statins Other (See Comments)    Some but not all    Patient Measurements: Height: 5\' 6"  (167.6 cm) Weight: 180 lb 11.2 oz (81.965 kg) IBW/kg (Calculated) : 59.3  Vital Signs: Temp: 98.2 F (36.8 C) (04/10 1411) Temp src: Oral (04/10 1411) BP: 131/71 mmHg (04/10 1411) Pulse Rate: 58 (04/10 1411)  Labs:  Recent Labs  05/15/12 1637 05/15/12 1715 05/17/12 0558  HGB 10.8*  --  11.0*  HCT 31.2*  --  32.2*  PLT 291  --  287  APTT  --  23*  --   LABPROT  --  13.8 14.1  INR  --  1.07 1.10  CREATININE 0.94  --   --   TROPONINI <0.30  --   --    Estimated Creatinine Clearance: 47.2 ml/min (by C-G formula based on Cr of 0.94).  Medical History: Past Medical History  Diagnosis Date  . Stroke   . Atrial fibrillation   . Diabetes mellitus   . Hypertension   . Coronary artery disease   . Hyperlipidemia 05/25/2011  . Anemia 05/25/2011  . Seizures     Noted at [facility] in Jan 2012, started on Keppra  . Depression   . Arthritis    Medications:  Scheduled:  . aspirin  300 mg Rectal Daily   Or  . aspirin  325 mg Oral Daily  . atorvastatin  10 mg Oral q morning - 10a  . cefTRIAXone (ROCEPHIN)  IV  1 g Intravenous Q24H  . donepezil  10 mg Oral q morning - 10a  . enoxaparin (LOVENOX) injection  40 mg Subcutaneous Q24H  . folic acid  1 mg Oral q morning - 10a  . insulin aspart  0-5 Units Subcutaneous QHS  . insulin aspart  0-9 Units Subcutaneous TID WC  . levETIRAcetam  500 mg Oral BID  . lisinopril  10 mg Oral q morning - 10a  . metoprolol tartrate  12.5 mg Oral BID  . [COMPLETED] warfarin  5 mg Oral Once  . Warfarin - Pharmacist Dosing Inpatient   Does not apply Q24H    Assessment: 79yo female with afib on chronic warfarin 5mg   daily PTA.  INR is at baseline on admission.   She presented with stroke-like symptoms but head CT was negative.   No bleeding noted.   Goal of Therapy:  INR 2-3 Monitor platelets by anticoagulation protocol: Yes   Plan:  Coumadin 7.5mg  PO today x 1 INR daily Overlap with Lovenox 40mg  sq daily until INR>2  Elson Clan 05/17/2012,3:27 PM

## 2012-05-17 NOTE — Evaluation (Signed)
Clinical/Bedside Swallow Evaluation  Patient Details  Name: Helen [name] MRN: [id number] Date of Birth: Dec 18, 1924  Today's Date: 05/17/2012 Time: 2130-8657 SLP Time Calculation (min): 19 min  Past Medical History:  Past Medical History  Diagnosis Date  . Stroke   . Atrial fibrillation   . Diabetes mellitus   . Hypertension   . Coronary artery disease   . Hyperlipidemia 05/25/2011  . Anemia 05/25/2011  . Seizures     Noted at [facility] in Jan 2012, started on Keppra  . Depression   . Arthritis    Past Surgical History:  Past Surgical History  Procedure Laterality Date  . Appendectomy    . Abdominal hysterectomy    . Back surgery  1990s  . Colonoscopy  01/29/2004    [facility]:  rectal polyp-cannot remember?path  . Colonoscopy N/A 05/03/2012    RMR: multiple colonic polyps, s/p piecemeal polypectomy, diverticulosis, fragments of tubular adenomas   HPI:  Helen [name] is an 79 yo woman with underlying Alzheimer's disease probably also with vascular dementia, presents once again with symptoms related to a CVA or TIA. She presented apparently with slurred speech and possibly left arm weakness. CT scan of the head was negative for acute CVA. She was not a candidate for TPA because she recently had a significant GI bleed.   Assessment / Plan / Recommendation Clinical Impression  Helen [name] initially passed the RN swallow screen, however after speaking with her nurse, Arline Asp SLP completed BSE due to reports of pt "getting choked" with lunch tray when fed by friend/family. Pt tolerated trials of regular texture and thin liquids without incident. No overt signs or symptoms aspiration observed. Will change to mechanical soft since pt had difficulty with meat on lunch tray.    Aspiration Risk  Mild    Diet Recommendation Dysphagia 3 (Mechanical Soft);Thin liquid   Liquid Administration via: Cup;Straw Medication Administration: Whole meds with liquid Supervision: Patient able to self  feed Compensations: Slow rate Postural Changes and/or Swallow Maneuvers: Seated upright 90 degrees;Upright 30-60 min after meal    Other  Recommendations Oral Care Recommendations: Oral care BID Other Recommendations: Clarify dietary restrictions   Follow Up Recommendations  None    Frequency and Duration  N/A         SLP Swallow Goals  N/A   Swallow Study Prior Functional Status       General Date of Onset: 05/16/12 HPI: Helen [name] is an 79 yo woman with underlying Alzheimer's disease probably also with vascular dementia, presents once again with symptoms related to a CVA or TIA. She presented apparently with slurred speech and possibly left arm weakness. CT scan of the head was negative for acute CVA. She was not a candidate for TPA because she recently had a significant GI bleed. Type of Study: Bedside swallow evaluation Diet Prior to this Study: Regular;Thin liquids Temperature Spikes Noted: No Respiratory Status: Room air History of Recent Intubation: No Behavior/Cognition: Alert;Cooperative (slow responses) Oral Cavity - Dentition: Adequate natural dentition Self-Feeding Abilities: Able to feed self Patient Positioning: Upright in chair Baseline Vocal Quality: Clear Volitional Cough: Strong Volitional Swallow: Able to elicit    Oral/Motor/Sensory Function Overall Oral Motor/Sensory Function: Appears within functional limits for tasks assessed   Ice Chips Ice chips: Within functional limits Presentation: Spoon   Thin Liquid Thin Liquid: Within functional limits Presentation: Self Fed;Straw    Nectar Thick Nectar Thick Liquid: Not tested   Honey Thick Honey Thick Liquid: Not tested   Puree  Puree: Within functional limits Presentation: Spoon   Solid   Thank you,  Havery Moros, CCC-SLP [phone]     Solid: Within functional limits Presentation: Self Fed       PORTER,DABNEY 05/17/2012,11:36 PM

## 2012-05-17 NOTE — Progress Notes (Signed)
Subjective: This 79 year old lady, who has underlying Alzheimer's disease probably also with vascular dementia, presents once again with symptoms related to a CVA or TIA. She presented apparently with slurred speech and possibly left arm weakness. Yesterday, when I saw her, it appeared  that her speech was back to normal and there was no real weakness in the left arm. CT scan of the head was negative for acute CVA. She was not a candidate for TPA because she recently had a significant GI bleed. Today she has had a repeat CT scan of the brain and there is no acute changes indicative of a CVA. Warfarin has been started yesterday by pharmacy.            Physical Exam: Blood pressure 193/68, pulse 65, temperature 98.1 F (36.7 C), temperature source Oral, resp. rate 20, height 5\' 6"  (1.676 m), weight 81.965 kg (180 lb 11.2 oz), SpO2 100.00%. Looks systemically well. No dysarthria. No facial weakness. Strength in the left and right arm is equal. Heart sounds are present and appear to be in atrial fibrillation. Lung fields are clear.   Investigations:  Recent Results (from the past 240 hour(s))  MRSA PCR SCREENING     Status: None   Collection Time    05/08/12  7:55 AM      Result Value Range Status   MRSA by PCR NEGATIVE  NEGATIVE Final   Comment:            The GeneXpert MRSA Assay (FDA     approved for NASAL specimens     only), is one component of a     comprehensive MRSA colonization     surveillance program. It is not     intended to diagnose MRSA     infection nor to guide or     monitor treatment for     MRSA infections.  URINE CULTURE     Status: None   Collection Time    05/15/12 10:22 PM      Result Value Range Status   Specimen Description URINE, CATHETERIZED   Final   Special Requests NONE   Final   Culture  Setup Time 05/15/2012 23:40   Final   Colony Count >=100,000 COLONIES/ML   Final   Culture ESCHERICHIA COLI   Final   Report Status PENDING    Incomplete     Basic Metabolic Panel:  Recent Labs  16/10/96 1637  NA 131*  K 3.9  CL 94*  CO2 25  GLUCOSE 120*  BUN 11  CREATININE 0.94  CALCIUM 9.4   Liver Function Tests:  Recent Labs  05/15/12 1637  AST 19  ALT 23  ALKPHOS 99  BILITOT 0.2*  PROT 7.2  ALBUMIN 3.7     CBC:  Recent Labs  05/15/12 1637 05/17/12 0558  WBC 8.7 7.7  NEUTROABS 5.8  --   HGB 10.8* 11.0*  HCT 31.2* 32.2*  MCV 90.7 90.2  PLT 291 287    Ct Head Wo Contrast  05/17/2012  *RADIOLOGY REPORT*  Clinical Data: Altered mental status with left-sided weakness  CT HEAD WITHOUT CONTRAST  Technique:  Contiguous axial images were obtained from the base of the skull through the vertex without contrast.  Comparison: 05/15/2012  Findings: Unchanged cerebral and cerebellar atrophy and bilateral white matter hypodensities.  No convincing cortical infarction or lobar hemorrhage.  Hounsfield artifact in the posterior fossa is moderate, and I am unable to exclude a  brainstem ischemic event.  No hydrocephalus or extra-axial fluid.  No intracranial mass lesion is visible.  Vascular calcification is noted.  The calvarium is intact.  There is no acute sinus or mastoid disease.  IMPRESSION: Stable CT head.  Atrophy and chronic microvascular ischemic change. No visible acute  cortical infarction.  If there are no contraindications, MRI brain without contrast could be helpful in further evaluation.   Original Report Authenticated By: Davonna Belling, M.D.    Ct Head Wo Contrast  05/15/2012  *RADIOLOGY REPORT*  Clinical Data: Weakness.  Diabetes, hypertension, prior CVA and seizures.  CT HEAD WITHOUT CONTRAST  Technique:  Contiguous axial images were obtained from the base of the skull through the vertex without contrast.  Comparison: Head CT 07/30/2011  Findings: Some of the images were repeated due to patient motion.  There is stable, moderate chronic small vessel ischemic change in the periventricular white matter  bilaterally.  No acute intracranial abnormality is identified.  Specifically, no hemorrhage, hydrocephalus, mass lesion, acute cortically based infarction or midline shift is identified.  The visualized paranasal sinuses, mastoid air cells, and middle ears are clear.  The skull is intact.  IMPRESSION: No acute intracranial abnormality.  Stable chronic microvascular ischemic changes.  This report was telephoned to Dr. Bebe Shaggy at 4:58 p.m. 05/15/2012.   Original Report Authenticated By: Britta Mccreedy, M.D.    Dg Chest Port 1 View  05/16/2012  *RADIOLOGY REPORT*  Clinical Data: 79 year old female with stroke.  Weakness. Hypertension.  Diabetes.  Atrial fibrillation.  PORTABLE CHEST - 1 VIEW  Comparison: 05/15/2012 and earlier.  Findings: Portable semi upright AP view 1705 hours. Stable cardiomegaly and mediastinal contours.  Normal lung volumes.  No pneumothorax, pulmonary edema, definite pleural effusion or acute pulmonary opacity.  Chronic retrocardiac hypoventilation.  IMPRESSION: Stable cardiomegaly.  Mild chronic retrocardiac hypoventilation. No acute cardiopulmonary abnormality.   Original Report Authenticated By: Erskine Speed, M.D.    Dg Chest Portable 1 View  05/15/2012  *RADIOLOGY REPORT*  Clinical Data: Weakness, confusion and slurred speech.  PORTABLE CHEST - 1 VIEW  Comparison: 07/30/2011  Findings: Stable mild cardiomegaly and aortic tortuosity.  No edema, infiltrate or pleural fluid identified.  Visualized bony structures are unremarkable.  IMPRESSION: No active disease.  Stable cardiomegaly.   Original Report Authenticated By: Irish Lack, M.D.       Medications: I have reviewed the patient's current medications.  Impression: 1. Embolic TIA . 2. Underlying Alzheimer's disease/vascular dementia. 3. Hypertension, uncontrolled. 4. Type 2 diabetes mellitus. 5. Hyperlipidemia. 6. Atrial fibrillation, previously on Coumadin. 7. Escherichia coli UTI.     Plan: 1. Discontinue IV  fluids. 2. Start intravenous Rocephin for Escherichia coli UTI. 3. Restart home antihypertensive medications as her blood pressure is not controlled. 4. Mobilize. 5. Disposition-should be able to go home the next 1-2 days depending on progress .     LOS: 2 days   Wilson Singer Pager [phone]  05/17/2012, 10:13 AM

## 2012-05-17 NOTE — Progress Notes (Signed)
Patient ID: Helen [name], female   DOB: Jun 05, 1924, 79 y.o.   MRN: [id number]  [facility] NEUROLOGY Donaciano Range A. Gerilyn Pilgrim, MD     [email or web address]          Helen [name] is an 79 y.o. female.   Assessment/Plan: 1. Likely small subcortical infarct although the patient could have reactivation of old stroke symptoms in the setting of urinary tract infection. FU EEG 2. Baseline dementia. Dementia labs are fine. 3. UTI. 4. Atrial fibrillation. The patient scan will be repeated today. This is fine, she should be restarted on chronic anticoagulation.  The patient has no new complaints. She is awake and alert but continues to have a paucity of speech. She reports that her right-sided weakness is unchanged although examination does not show right-sided weakness. Visual fields are full. Pupils are reactive and extraocular movements are intact. She has good strength today and both upper extremities graded 5/5 including deltoid and triceps on the left. She continues to have a pronator drift on the left however.    Objective: Vital signs in last 24 hours: Temp:  [97.5 F (36.4 C)-98.7 F (37.1 C)] 98.1 F (36.7 C) (04/10 0503) Pulse Rate:  [59-70] 65 (04/10 0503) Resp:  [20] 20 (04/10 0503) BP: (152-195)/(62-72) 193/68 mmHg (04/10 0503) SpO2:  [97 %-100 %] 100 % (04/10 0503)  Intake/Output from previous day: 04/09 0701 - 04/10 0700 In: 150 [P.O.:150] Out: -  Intake/Output this shift:   Nutritional status: Carb Control   Lab Results: Results for orders placed during the hospital encounter of 05/15/12 (from the past 48 hour(s))  CBC     Status: Abnormal   Collection Time    05/15/12  4:37 PM      Result Value Range   WBC 8.7  4.0 - 10.5 K/uL   RBC 3.44 (*) 3.87 - 5.11 MIL/uL   Hemoglobin 10.8 (*) 12.0 - 15.0 g/dL   HCT 56.2 (*) 13.0 - 86.5 %   MCV 90.7  78.0 - 100.0 fL   MCH 31.4  26.0 - 34.0 pg   MCHC 34.6  30.0 - 36.0 g/dL   RDW 78.4  69.6 - 29.5 %   Platelets 291  150 -  400 K/uL  DIFFERENTIAL     Status: None   Collection Time    05/15/12  4:37 PM      Result Value Range   Neutrophils Relative 67  43 - 77 %   Neutro Abs 5.8  1.7 - 7.7 K/uL   Lymphocytes Relative 24  12 - 46 %   Lymphs Abs 2.1  0.7 - 4.0 K/uL   Monocytes Relative 8  3 - 12 %   Monocytes Absolute 0.7  0.1 - 1.0 K/uL   Eosinophils Relative 1  0 - 5 %   Eosinophils Absolute 0.1  0.0 - 0.7 K/uL   Basophils Relative 0  0 - 1 %   Basophils Absolute 0.0  0.0 - 0.1 K/uL  COMPREHENSIVE METABOLIC PANEL     Status: Abnormal   Collection Time    05/15/12  4:37 PM      Result Value Range   Sodium 131 (*) 135 - 145 mEq/L   Potassium 3.9  3.5 - 5.1 mEq/L   Chloride 94 (*) 96 - 112 mEq/L   CO2 25  19 - 32 mEq/L   Glucose, Bld 120 (*) 70 - 99 mg/dL   BUN 11  6 - 23 mg/dL   Creatinine, Ser 2.84  0.50 - 1.10 mg/dL   Calcium 9.4  8.4 - 69.6 mg/dL   Total Protein 7.2  6.0 - 8.3 g/dL   Albumin 3.7  3.5 - 5.2 g/dL   AST 19  0 - 37 U/L   ALT 23  0 - 35 U/L   Alkaline Phosphatase 99  39 - 117 U/L   Total Bilirubin 0.2 (*) 0.3 - 1.2 mg/dL   GFR calc non Af Amer 54 (*) >90 mL/min   GFR calc Af Amer 62 (*) >90 mL/min   Comment:            The eGFR has been calculated     using the CKD EPI equation.     This calculation has not been     validated in all clinical     situations.     eGFR's persistently     <90 mL/min signify     possible Chronic Kidney Disease.  TROPONIN I     Status: None   Collection Time    05/15/12  4:37 PM      Result Value Range   Troponin I <0.30  <0.30 ng/mL   Comment:            Due to the release kinetics of cTnI,     a negative result within the first hours     of the onset of symptoms does not rule out     myocardial infarction with certainty.     If myocardial infarction is still suspected,     repeat the test at appropriate intervals.  TYPE AND SCREEN     Status: None   Collection Time    05/15/12  4:37 PM      Result Value Range   ABO/RH(D) B POS      Antibody Screen NEG     Sample Expiration 05/18/2012    GLUCOSE, CAPILLARY     Status: Abnormal   Collection Time    05/15/12  4:39 PM      Result Value Range   Glucose-Capillary 121 (*) 70 - 99 mg/dL  OCCULT BLOOD, POC DEVICE     Status: None   Collection Time    05/15/12  4:55 PM      Result Value Range   Fecal Occult Bld NEGATIVE  NEGATIVE  PROTIME-INR     Status: None   Collection Time    05/15/12  5:15 PM      Result Value Range   Prothrombin Time 13.8  11.6 - 15.2 seconds   INR 1.07  0.00 - 1.49  APTT     Status: Abnormal   Collection Time    05/15/12  5:15 PM      Result Value Range   aPTT 23 (*) 24 - 37 seconds  GLUCOSE, CAPILLARY     Status: Abnormal   Collection Time    05/15/12  9:47 PM      Result Value Range   Glucose-Capillary 174 (*) 70 - 99 mg/dL  URINE RAPID DRUG SCREEN (HOSP PERFORMED)     Status: None   Collection Time    05/15/12 10:21 PM      Result Value Range   Opiates NONE DETECTED  NONE DETECTED   Cocaine NONE DETECTED  NONE DETECTED   Benzodiazepines NONE DETECTED  NONE DETECTED   Amphetamines NONE DETECTED  NONE DETECTED   Tetrahydrocannabinol NONE DETECTED  NONE DETECTED   Barbiturates NONE DETECTED  NONE DETECTED   Comment:  DRUG SCREEN FOR MEDICAL PURPOSES     ONLY.  IF CONFIRMATION IS NEEDED     FOR ANY PURPOSE, NOTIFY LAB     WITHIN 5 DAYS.                LOWEST DETECTABLE LIMITS     FOR URINE DRUG SCREEN     Drug Class       Cutoff (ng/mL)     Amphetamine      1000     Barbiturate      200     Benzodiazepine   200     Tricyclics       300     Opiates          300     Cocaine          300     THC              50  URINALYSIS, ROUTINE W REFLEX MICROSCOPIC     Status: Abnormal   Collection Time    05/15/12 10:22 PM      Result Value Range   Color, Urine YELLOW  YELLOW   APPearance HAZY (*) CLEAR   Specific Gravity, Urine 1.015  1.005 - 1.030   pH 6.5  5.0 - 8.0   Glucose, UA NEGATIVE  NEGATIVE mg/dL   Hgb urine  dipstick TRACE (*) NEGATIVE   Bilirubin Urine NEGATIVE  NEGATIVE   Ketones, ur TRACE (*) NEGATIVE mg/dL   Protein, ur NEGATIVE  NEGATIVE mg/dL   Urobilinogen, UA 0.2  0.0 - 1.0 mg/dL   Nitrite NEGATIVE  NEGATIVE   Leukocytes, UA MODERATE (*) NEGATIVE  URINE MICROSCOPIC-ADD ON     Status: Abnormal   Collection Time    05/15/12 10:22 PM      Result Value Range   Squamous Epithelial / LPF RARE  RARE   WBC, UA TOO NUMEROUS TO COUNT  <3 WBC/hpf   RBC / HPF 0-2  <3 RBC/hpf   Bacteria, UA MANY (*) RARE  URINE CULTURE     Status: None   Collection Time    05/15/12 10:22 PM      Result Value Range   Specimen Description URINE, CATHETERIZED     Special Requests NONE     Culture  Setup Time 05/15/2012 23:40     Colony Count >=100,000 COLONIES/ML     Culture ESCHERICHIA COLI     Report Status PENDING    HEMOGLOBIN A1C     Status: Abnormal   Collection Time    05/16/12  4:42 AM      Result Value Range   Hemoglobin A1C 6.5 (*) <5.7 %   Comment: (NOTE)                                                                               According to the ADA Clinical Practice Recommendations for 2011, when     HbA1c is used as a screening test:      >=6.5%   Diagnostic of Diabetes Mellitus               (if abnormal result is confirmed)     5.7-6.4%  Increased risk of developing Diabetes Mellitus     References:Diagnosis and Classification of Diabetes Mellitus,Diabetes     Care,2011,34(Suppl 1):S62-S69 and Standards of Medical Care in             Diabetes - 2011,Diabetes Care,2011,34 (Suppl 1):S11-S61.   Mean Plasma Glucose 140 (*) <117 mg/dL  LIPID PANEL     Status: Abnormal   Collection Time    05/16/12  4:42 AM      Result Value Range   Cholesterol 191  0 - 200 mg/dL   Triglycerides 161 (*) <150 mg/dL   HDL 38 (*) >09 mg/dL   Total CHOL/HDL Ratio 5.0     VLDL 39  0 - 40 mg/dL   LDL Cholesterol 604 (*) 0 - 99 mg/dL   Comment:            Total Cholesterol/HDL:CHD Risk     Coronary  Heart Disease Risk Table                         Men   Women      1/2 Average Risk   3.4   3.3      Average Risk       5.0   4.4      2 X Average Risk   9.6   7.1      3 X Average Risk  23.4   11.0                Use the calculated Patient Ratio     above and the CHD Risk Table     to determine the patient's CHD Risk.                ATP III CLASSIFICATION (LDL):      <100     mg/dL   Optimal      540-981  mg/dL   Near or Above                        Optimal      130-159  mg/dL   Borderline      191-478  mg/dL   High      >295     mg/dL   Very High  GLUCOSE, CAPILLARY     Status: Abnormal   Collection Time    05/16/12  7:43 AM      Result Value Range   Glucose-Capillary 148 (*) 70 - 99 mg/dL   Comment 1 Notify RN    GLUCOSE, CAPILLARY     Status: Abnormal   Collection Time    05/16/12 11:41 AM      Result Value Range   Glucose-Capillary 198 (*) 70 - 99 mg/dL   Comment 1 Notify RN    TSH     Status: None   Collection Time    05/16/12 11:46 AM      Result Value Range   TSH 1.156  0.350 - 4.500 uIU/mL  VITAMIN B12     Status: None   Collection Time    05/16/12 11:46 AM      Result Value Range   Vitamin B-12 423  211 - 911 pg/mL  RPR     Status: None   Collection Time    05/16/12 11:46 AM      Result Value Range   RPR NON REACTIVE  NON REACTIVE  HOMOCYSTEINE     Status: None  Collection Time    05/16/12 11:46 AM      Result Value Range   Homocysteine 8.7  4.0 - 15.4 umol/L  GLUCOSE, CAPILLARY     Status: Abnormal   Collection Time    05/16/12  4:46 PM      Result Value Range   Glucose-Capillary 166 (*) 70 - 99 mg/dL   Comment 1 Documented in Chart     Comment 2 Notify RN    GLUCOSE, CAPILLARY     Status: Abnormal   Collection Time    05/16/12  8:41 PM      Result Value Range   Glucose-Capillary 159 (*) 70 - 99 mg/dL  PROTIME-INR     Status: None   Collection Time    05/17/12  5:58 AM      Result Value Range   Prothrombin Time 14.1  11.6 - 15.2 seconds    INR 1.10  0.00 - 1.49  CBC     Status: Abnormal   Collection Time    05/17/12  5:58 AM      Result Value Range   WBC 7.7  4.0 - 10.5 K/uL   RBC 3.57 (*) 3.87 - 5.11 MIL/uL   Hemoglobin 11.0 (*) 12.0 - 15.0 g/dL   HCT 16.1 (*) 09.6 - 04.5 %   MCV 90.2  78.0 - 100.0 fL   MCH 30.8  26.0 - 34.0 pg   MCHC 34.2  30.0 - 36.0 g/dL   RDW 40.9  81.1 - 91.4 %   Platelets 287  150 - 400 K/uL  GLUCOSE, CAPILLARY     Status: Abnormal   Collection Time    05/17/12  7:53 AM      Result Value Range   Glucose-Capillary 187 (*) 70 - 99 mg/dL    Lipid Panel  Recent Labs  05/16/12 0442  CHOL 191  TRIG 196*  HDL 38*  CHOLHDL 5.0  VLDL 39  LDLCALC 782*    Studies/Results: Ct Head Wo Contrast  05/15/2012  *RADIOLOGY REPORT*  Clinical Data: Weakness.  Diabetes, hypertension, prior CVA and seizures.  CT HEAD WITHOUT CONTRAST  Technique:  Contiguous axial images were obtained from the base of the skull through the vertex without contrast.  Comparison: Head CT 07/30/2011  Findings: Some of the images were repeated due to patient motion.  There is stable, moderate chronic small vessel ischemic change in the periventricular white matter bilaterally.  No acute intracranial abnormality is identified.  Specifically, no hemorrhage, hydrocephalus, mass lesion, acute cortically based infarction or midline shift is identified.  The visualized paranasal sinuses, mastoid air cells, and middle ears are clear.  The skull is intact.  IMPRESSION: No acute intracranial abnormality.  Stable chronic microvascular ischemic changes.  This report was telephoned to Dr. Bebe Shaggy at 4:58 p.m. 05/15/2012.   Original Report Authenticated By: Britta Mccreedy, M.D.    Dg Chest Port 1 View  05/16/2012  *RADIOLOGY REPORT*  Clinical Data: 79 year old female with stroke.  Weakness. Hypertension.  Diabetes.  Atrial fibrillation.  PORTABLE CHEST - 1 VIEW  Comparison: 05/15/2012 and earlier.  Findings: Portable semi upright AP view 1705 hours.  Stable cardiomegaly and mediastinal contours.  Normal lung volumes.  No pneumothorax, pulmonary edema, definite pleural effusion or acute pulmonary opacity.  Chronic retrocardiac hypoventilation.  IMPRESSION: Stable cardiomegaly.  Mild chronic retrocardiac hypoventilation. No acute cardiopulmonary abnormality.   Original Report Authenticated By: Erskine Speed, M.D.    Dg Chest Portable 1 View  05/15/2012  *RADIOLOGY  REPORT*  Clinical Data: Weakness, confusion and slurred speech.  PORTABLE CHEST - 1 VIEW  Comparison: 07/30/2011  Findings: Stable mild cardiomegaly and aortic tortuosity.  No edema, infiltrate or pleural fluid identified.  Visualized bony structures are unremarkable.  IMPRESSION: No active disease.  Stable cardiomegaly.   Original Report Authenticated By: Irish Lack, M.D.     Medications:  Scheduled Meds: . aspirin  300 mg Rectal Daily   Or  . aspirin  325 mg Oral Daily  . atorvastatin  10 mg Oral q morning - 10a  . donepezil  10 mg Oral q morning - 10a  . enoxaparin (LOVENOX) injection  40 mg Subcutaneous Q24H  . folic acid  1 mg Oral q morning - 10a  . insulin aspart  0-5 Units Subcutaneous QHS  . insulin aspart  0-9 Units Subcutaneous TID WC  . levETIRAcetam  500 mg Oral BID  . metoprolol tartrate  12.5 mg Oral BID  . Warfarin - Pharmacist Dosing Inpatient   Does not apply Q24H   Continuous Infusions: . sodium chloride 50 mL/hr at 05/17/12 0817   PRN Meds:.acetaminophen, ondansetron (ZOFRAN) IV, senna-docusate    LOS: 2 days   Malic Rosten A. Gerilyn Pilgrim, M.D.  Diplomate, Biomedical engineer of Psychiatry and Neurology ( Neurology).

## 2012-05-17 NOTE — Progress Notes (Signed)
Occupational Therapy Treatment Patient Details Name: KEARRA CALKIN MRN: [id number] DOB: [birth year] Today's Date: 05/17/2012 Time: 9147-8295 OT Time Calculation (min): 32 min  OT Assessment / Plan / Recommendation Comments on Treatment Session      Follow Up Recommendations       Barriers to Discharge       Equipment Recommendations       Recommendations for Other Services    Frequency     Plan      Precautions / Restrictions Precautions Precautions: Fall Restrictions Weight Bearing Restrictions: No       ADL  Grooming: Teeth care;Set up;Other (comment) (set up the toothbrush and toothpaste) Where Assessed - Grooming: Supported standing;Other (comment) (with walker and use of sink for support) Toilet Transfer: Performed Toilet Transfer Method: Other (comment) (ambulated with rolling walker) Toilet Transfer Equipment: Comfort height toilet Toileting - Clothing Manipulation and Hygiene: Supervision/safety Equipment Used: Rolling walker;Gait belt Transfers/Ambulation Related to ADLs: Mod assist for safety and use of the walker    OT Diagnosis:    OT Problem List:   OT Treatment Interventions:     OT Goals    Visit Information       Subjective Data  Subjective: S:  I need to use the bathroom   Prior Functioning       Cognition  Cognition Overall Cognitive Status: Difficult to assess Difficult to assess due to: Other (comment) (some difficulty at times following directions and no sefety ) Arousal/Alertness: Awake/alert Orientation Level: Appears intact for tasks assessed Behavior During Session: [facility] for tasks performed    Mobility  Bed Mobility Bed Mobility: Supine to Sit;Sitting - Scoot to Edge of Bed Supine to Sit: 3: Mod assist;HOB elevated Sitting - Scoot to Edge of Bed: 4: Min guard Transfers Transfers: Stand to Sit;Sit to Stand Sit to Stand: 4: Min assist;With upper extremity assist Stand to Sit: 4: Min assist;With upper extremity assist Details  for Transfer Assistance: Needed max vc's for techniques, hand placement and safety awareness during transfer.    Exercises      Balance     End of Session OT - End of Session Equipment Utilized During Treatment: Gait belt Activity Tolerance: Patient tolerated treatment well Patient left: in chair;with chair alarm set;with call bell/phone within reach  GO     Juline Sanderford L. Jai Bear, COTA/L 05/17/2012, 1:07 PM

## 2012-05-18 ENCOUNTER — Ambulatory Visit: Payer: Medicare Other | Admitting: Family Medicine

## 2012-05-18 DIAGNOSIS — G40909 Epilepsy, unspecified, not intractable, without status epilepticus: Secondary | ICD-10-CM

## 2012-05-18 DIAGNOSIS — I6340 Cerebral infarction due to embolism of unspecified cerebral artery: Principal | ICD-10-CM

## 2012-05-18 LAB — CBC
Hemoglobin: 10.9 g/dL — ABNORMAL LOW (ref 12.0–15.0)
MCV: 89.4 fL (ref 78.0–100.0)
Platelets: 304 10*3/uL (ref 150–400)
RDW: 14.5 % (ref 11.5–15.5)

## 2012-05-18 LAB — GLUCOSE, CAPILLARY
Glucose-Capillary: 208 mg/dL — ABNORMAL HIGH (ref 70–99)
Glucose-Capillary: 296 mg/dL — ABNORMAL HIGH (ref 70–99)

## 2012-05-18 LAB — BASIC METABOLIC PANEL
CO2: 22 mEq/L (ref 19–32)
Chloride: 98 mEq/L (ref 96–112)
GFR calc Af Amer: 87 mL/min — ABNORMAL LOW (ref >90)
Potassium: 3.5 mEq/L (ref 3.5–5.1)

## 2012-05-18 LAB — PROTIME-INR
INR: 1.18 (ref 0.00–1.49)
Prothrombin Time: 14.8 seconds (ref 11.6–15.2)

## 2012-05-18 MED ORDER — AMOXICILLIN-POT CLAVULANATE 875-125 MG PO TABS: 1.0000 | ORAL_TABLET | Freq: Two times a day (BID) | ORAL | Status: DC

## 2012-05-18 MED ORDER — WARFARIN SODIUM 7.5 MG PO TABS: 7.5000 mg | ORAL_TABLET | Freq: Once | ORAL | Status: DC

## 2012-05-18 NOTE — Progress Notes (Signed)
ANTICOAGULATION CONSULT NOTE  Pharmacy Consult for Coumadin Indication: atrial fibrillation  Allergies  Allergen Reactions  . Ciprofloxacin     Patient states that she may have had a stroke due to this medication  . Niacin And Related Hives  . Statins Other (See Comments)    Some but not all    Patient Measurements: Height: 5\' 6"  (167.6 cm) Weight: 180 lb 11.2 oz (81.965 kg) IBW/kg (Calculated) : 59.3  Vital Signs: Temp: 97.4 F (36.3 C) (04/11 0410) Temp src: Oral (04/11 0410) BP: 198/81 mmHg (04/11 0410) Pulse Rate: 63 (04/11 0410)  Labs:  Recent Labs  05/15/12 1637 05/15/12 1715 05/17/12 0558 05/18/12 0517  HGB 10.8*  --  11.0* 10.9*  HCT 31.2*  --  32.2* 31.2*  PLT 291  --  287 304  APTT  --  23*  --   --   LABPROT  --  13.8 14.1 14.8  INR  --  1.07 1.10 1.18  CREATININE 0.94  --   --  0.76  TROPONINI <0.30  --   --   --    Estimated Creatinine Clearance: 55.5 ml/min (by C-G formula based on Cr of 0.76).  Medical History: Past Medical History  Diagnosis Date  . Stroke   . Atrial fibrillation   . Diabetes mellitus   . Hypertension   . Coronary artery disease   . Hyperlipidemia 05/25/2011  . Anemia 05/25/2011  . Seizures     Noted at [facility] in Jan 2012, started on Keppra  . Depression   . Arthritis    Medications:  Scheduled:  . aspirin  300 mg Rectal Daily   Or  . aspirin  325 mg Oral Daily  . atorvastatin  10 mg Oral q morning - 10a  . cefTRIAXone (ROCEPHIN)  IV  1 g Intravenous Q24H  . donepezil  10 mg Oral q morning - 10a  . enoxaparin (LOVENOX) injection  40 mg Subcutaneous Q24H  . folic acid  1 mg Oral q morning - 10a  . insulin aspart  0-5 Units Subcutaneous QHS  . insulin aspart  0-9 Units Subcutaneous TID WC  . levETIRAcetam  500 mg Oral BID  . lisinopril  10 mg Oral q morning - 10a  . metoprolol tartrate  12.5 mg Oral BID  . [COMPLETED] warfarin  7.5 mg Oral ONCE-1800  . Warfarin - Pharmacist Dosing Inpatient   Does not apply Q24H    Assessment: 79yo female with afib on chronic warfarin 5mg  daily PTA.  INR is at baseline on admission.   She presented with stroke-like symptoms but head CT was negative.   No bleeding noted.   Goal of Therapy:  INR 2-3 Monitor platelets by anticoagulation protocol: Yes   Plan:  Coumadin 7.5mg  PO today x 1 INR daily Overlap with Lovenox 40mg  sq daily until [employer], Schwanda Zima A 05/18/2012,8:59 AM

## 2012-05-18 NOTE — Discharge Summary (Signed)
Physician Discharge Summary  Patient ID: OTIE HEADLEE MRN: [id number] DOB/AGE: November 18, 1924 79 y.o.  Admit date: 05/15/2012 Discharge date: 05/18/2012  Discharge Diagnoses:  Principal Problem:   Stroke, embolic Active Problems:   Dementia   DM type 2 (diabetes mellitus, type 2)   HTN (hypertension)   Paroxysmal atrial fibrillation   Hyperlipidemia   Anemia   Seizure disorder  Tests pending at the time of admission: EEG    Medication List    TAKE these medications       acetaminophen 500 MG tablet  Commonly known as:  TYLENOL  Take 500 mg by mouth 2 (two) times daily as needed for pain.     amoxicillin-clavulanate 875-125 MG per tablet  Commonly known as:  AUGMENTIN  Take 1 tablet by mouth 2 (two) times daily.     atorvastatin 10 MG tablet  Commonly known as:  LIPITOR  Take 10 mg by mouth every morning.     CENTRUM SILVER ULTRA WOMENS PO  Take 1 tablet by mouth every morning. At 700     diphenoxylate-atropine 2.5-0.025 MG per tablet  Commonly known as:  LOMOTIL  Take 1 tablet by mouth 4 (four) times daily as needed for diarrhea or loose stools.     donepezil 10 MG tablet  Commonly known as:  ARICEPT  Take 10 mg by mouth every morning.     escitalopram 5 MG tablet  Commonly known as:  LEXAPRO  Take 5 mg by mouth daily.     ferrous sulfate 325 (65 FE) MG tablet  Take 1 tablet (325 mg total) by mouth daily with breakfast.     folic acid 1 MG tablet  Commonly known as:  FOLVITE  Take 1 mg by mouth every morning. At 700     glipiZIDE 5 MG tablet  Commonly known as:  GLUCOTROL  Take 5 mg by mouth every morning.     levETIRAcetam 500 MG tablet  Commonly known as:  KEPPRA  Take 500 mg by mouth 2 (two) times daily.     lisinopril 10 MG tablet  Commonly known as:  PRINIVIL,ZESTRIL  Take 10 mg by mouth every morning.     metoprolol tartrate 25 MG tablet  Commonly known as:  LOPRESSOR  Take 12.5 mg by mouth 2 (two) times daily.     MYRBETRIQ 50 MG Tb24   Generic drug:  mirabegron ER  Take 50 mg by mouth every morning.     potassium chloride 10 MEQ tablet  Commonly known as:  K-DUR  Take 10 mEq by mouth 2 (two) times daily.     protective barrier Crea  Apply 1 application topically daily.     TOVIAZ 8 MG Tb24  Generic drug:  fesoterodine  Take 8 mg by mouth every morning.     warfarin 5 MG tablet  Commonly known as:  COUMADIN  Take 5 mg by mouth daily.     zolpidem 5 MG tablet  Commonly known as:  AMBIEN  Take 5 mg by mouth at bedtime as needed for sleep.            Discharge Orders   Future Appointments Provider Department Dept Phone   06/14/2012 3:30 PM Nira Retort, NP [facility] [phone]   06/25/2012 3:45 PM Salley Scarlet, MD [facility] [phone]   Future Orders Complete By Expires     Diet - low sodium heart healthy  As directed     Walk with assistance  As directed        Follow-up Information   Follow up with Milinda Antis, MD.   Contact information:   [address], [address] [place] [address] [phone]       Disposition: 01-Home or Self Care with 24 hour care  Discharged Condition: stable  Consults: Treatment Team:  Beryle Beams, MD  Labs:   Results for orders placed during the hospital encounter of 05/15/12 (from the past 48 hour(s))  GLUCOSE, CAPILLARY     Status: Abnormal   Collection Time    05/16/12 11:41 AM      Result Value Range   Glucose-Capillary 198 (*) 70 - 99 mg/dL   Comment 1 Notify RN    TSH     Status: None   Collection Time    05/16/12 11:46 AM      Result Value Range   TSH 1.156  0.350 - 4.500 uIU/mL  VITAMIN B12     Status: None   Collection Time    05/16/12 11:46 AM      Result Value Range   Vitamin B-12 423  211 - 911 pg/mL  RPR     Status: None   Collection Time    05/16/12 11:46 AM      Result Value Range   RPR NON REACTIVE  NON REACTIVE  HOMOCYSTEINE     Status: None   Collection Time    05/16/12  11:46 AM      Result Value Range   Homocysteine 8.7  4.0 - 15.4 umol/L  GLUCOSE, CAPILLARY     Status: Abnormal   Collection Time    05/16/12  4:46 PM      Result Value Range   Glucose-Capillary 166 (*) 70 - 99 mg/dL   Comment 1 Documented in Chart     Comment 2 Notify RN    GLUCOSE, CAPILLARY     Status: Abnormal   Collection Time    05/16/12  8:41 PM      Result Value Range   Glucose-Capillary 159 (*) 70 - 99 mg/dL  PROTIME-INR     Status: None   Collection Time    05/17/12  5:58 AM      Result Value Range   Prothrombin Time 14.1  11.6 - 15.2 seconds   INR 1.10  0.00 - 1.49  CBC     Status: Abnormal   Collection Time    05/17/12  5:58 AM      Result Value Range   WBC 7.7  4.0 - 10.5 K/uL   RBC 3.57 (*) 3.87 - 5.11 MIL/uL   Hemoglobin 11.0 (*) 12.0 - 15.0 g/dL   HCT 91.4 (*) 78.2 - 95.6 %   MCV 90.2  78.0 - 100.0 fL   MCH 30.8  26.0 - 34.0 pg   MCHC 34.2  30.0 - 36.0 g/dL   RDW 21.3  08.6 - 57.8 %   Platelets 287  150 - 400 K/uL  GLUCOSE, CAPILLARY     Status: Abnormal   Collection Time    05/17/12  7:53 AM      Result Value Range   Glucose-Capillary 187 (*) 70 - 99 mg/dL  GLUCOSE, CAPILLARY     Status: Abnormal   Collection Time    05/17/12 11:24 AM      Result Value Range   Glucose-Capillary 202 (*) 70 - 99 mg/dL  GLUCOSE, CAPILLARY     Status: Abnormal   Collection Time  05/17/12  4:44 PM      Result Value Range   Glucose-Capillary 213 (*) 70 - 99 mg/dL   Comment 1 Documented in Chart     Comment 2 Notify RN    GLUCOSE, CAPILLARY     Status: Abnormal   Collection Time    05/17/12  8:52 PM      Result Value Range   Glucose-Capillary 208 (*) 70 - 99 mg/dL  PROTIME-INR     Status: None   Collection Time    05/18/12  5:17 AM      Result Value Range   Prothrombin Time 14.8  11.6 - 15.2 seconds   INR 1.18  0.00 - 1.49  CBC     Status: Abnormal   Collection Time    05/18/12  5:17 AM      Result Value Range   WBC 7.2  4.0 - 10.5 K/uL   RBC 3.49 (*)  3.87 - 5.11 MIL/uL   Hemoglobin 10.9 (*) 12.0 - 15.0 g/dL   HCT 16.1 (*) 09.6 - 04.5 %   MCV 89.4  78.0 - 100.0 fL   MCH 31.2  26.0 - 34.0 pg   MCHC 34.9  30.0 - 36.0 g/dL   RDW 40.9  81.1 - 91.4 %   Platelets 304  150 - 400 K/uL  BASIC METABOLIC PANEL     Status: Abnormal   Collection Time    05/18/12  5:17 AM      Result Value Range   Sodium 134 (*) 135 - 145 mEq/L   Potassium 3.5  3.5 - 5.1 mEq/L   Chloride 98  96 - 112 mEq/L   CO2 22  19 - 32 mEq/L   Glucose, Bld 181 (*) 70 - 99 mg/dL   BUN 12  6 - 23 mg/dL   Creatinine, Ser 7.82  0.50 - 1.10 mg/dL   Calcium 8.9  8.4 - 95.6 mg/dL   GFR calc non Af Amer 75 (*) >90 mL/min   GFR calc Af Amer 87 (*) >90 mL/min   Comment:            The eGFR has been calculated     using the CKD EPI equation.     This calculation has not been     validated in all clinical     situations.     eGFR's persistently     <90 mL/min signify     possible Chronic Kidney Disease.  GLUCOSE, CAPILLARY     Status: Abnormal   Collection Time    05/18/12  7:50 AM      Result Value Range   Glucose-Capillary 180 (*) 70 - 99 mg/dL    Diagnostics:  Ct Head Wo Contrast  05/17/2012  *RADIOLOGY REPORT*  Clinical Data: Altered mental status with left-sided weakness  CT HEAD WITHOUT CONTRAST  Technique:  Contiguous axial images were obtained from the base of the skull through the vertex without contrast.  Comparison: 05/15/2012  Findings: Unchanged cerebral and cerebellar atrophy and bilateral white matter hypodensities.  No convincing cortical infarction or lobar hemorrhage.  Hounsfield artifact in the posterior fossa is moderate, and I am unable to exclude a  brainstem ischemic event. No hydrocephalus or extra-axial fluid.  No intracranial mass lesion is visible.  Vascular calcification is noted.  The calvarium is intact.  There is no acute sinus or mastoid disease.  IMPRESSION: Stable CT head.  Atrophy and chronic microvascular ischemic change. No visible acute   cortical  infarction.  If there are no contraindications, MRI brain without contrast could be helpful in further evaluation.   Original Report Authenticated By: Davonna Belling, M.D.    Ct Head Wo Contrast  05/15/2012  *RADIOLOGY REPORT*  Clinical Data: Weakness.  Diabetes, hypertension, prior CVA and seizures.  CT HEAD WITHOUT CONTRAST  Technique:  Contiguous axial images were obtained from the base of the skull through the vertex without contrast.  Comparison: Head CT 07/30/2011  Findings: Some of the images were repeated due to patient motion.  There is stable, moderate chronic small vessel ischemic change in the periventricular white matter bilaterally.  No acute intracranial abnormality is identified.  Specifically, no hemorrhage, hydrocephalus, mass lesion, acute cortically based infarction or midline shift is identified.  The visualized paranasal sinuses, mastoid air cells, and middle ears are clear.  The skull is intact.  IMPRESSION: No acute intracranial abnormality.  Stable chronic microvascular ischemic changes.  This report was telephoned to Dr. Bebe Shaggy at 4:58 p.m. 05/15/2012.   Original Report Authenticated By: Britta Mccreedy, M.D.    Dg Chest Port 1 View  05/16/2012  *RADIOLOGY REPORT*  Clinical Data: 79 year old female with stroke.  Weakness. Hypertension.  Diabetes.  Atrial fibrillation.  PORTABLE CHEST - 1 VIEW  Comparison: 05/15/2012 and earlier.  Findings: Portable semi upright AP view 1705 hours. Stable cardiomegaly and mediastinal contours.  Normal lung volumes.  No pneumothorax, pulmonary edema, definite pleural effusion or acute pulmonary opacity.  Chronic retrocardiac hypoventilation.  IMPRESSION: Stable cardiomegaly.  Mild chronic retrocardiac hypoventilation. No acute cardiopulmonary abnormality.   Original Report Authenticated By: Erskine Speed, M.D.    Dg Chest Portable 1 View  05/15/2012  *RADIOLOGY REPORT*  Clinical Data: Weakness, confusion and slurred speech.  PORTABLE CHEST - 1 VIEW   Comparison: 07/30/2011  Findings: Stable mild cardiomegaly and aortic tortuosity.  No edema, infiltrate or pleural fluid identified.  Visualized bony structures are unremarkable.  IMPRESSION: No active disease.  Stable cardiomegaly.   Original Report Authenticated By: Irish Lack, M.D.    EKG: Showed sinus rhythm with PACs, incomplete right bundle branch block.  DNR   Hospital Course: See H&P for complete admission details. The patient is an 79 year old white female with previous history of stroke and TIA, paroxysmal atrial fibrillation. She had been admitted recently for GI bleed and Coumadin had been held. She presented with altered mental status, lethargy, left-sided weakness and slurred speech. On physical examination, she was drowsy, mild left facial weakness. Left pronator drift. Decreased strength in the left lower extremity. CT brain showed nothing acute. Because of recent clipping during colonoscopy, she was unable to get an MRI scan. Neurology was consulted and felt that she had had either a small subcortical infarct that fully resolved in the setting of dementia and urinary tract infection, or transient weakness due to urinary tract infection. Patient worked with physical therapy. EEG was done but not yet read. Patient has been cleared for discharge. She is back to her baseline. She will have her INR drawn next week. Total time of discharge greater than 30 minutes.  Discharge Exam:  Blood pressure 198/81, pulse 63, temperature 97.4 F (36.3 C), temperature source Oral, resp. rate 20, height 5\' 6"  (1.676 m), weight 81.965 kg (180 lb 11.2 oz), SpO2 99.00%.  General: Alert. Talkative. Working with physical therapy. Speech clear and fluent   Signed: Belinda Bringhurst L 05/18/2012, 11:29 AM

## 2012-05-18 NOTE — Progress Notes (Signed)
Patient discharged home with caregiver.  Instructed to keep appt with PCP.  Also instructed to continue checking INR/PT on Wednesdays as before.  IVs removed - WNL.  Patient and caregiver verbalize understanding of discharge instructions.  Stable to discharge

## 2012-05-19 NOTE — Procedures (Signed)
[facility] Osmany Azer A. Gerilyn Pilgrim, MD     [email or web address]        NAMEFAHIMA, CIFELLI                ACCOUNT NO.:  [id number]  MEDICAL RECORD NO.:  [id number]  LOCATION:  EE                           FACILITY:  [facility]  PHYSICIAN:  Sandra Tellefsen A. Gerilyn Pilgrim, M.D. DATE OF BIRTH:  03-08-1924  DATE OF PROCEDURE:  05/16/2012 DATE OF DISCHARGE:  05/16/2012                             EEG INTERPRETATION   INDICATION:  An 79 year old, who presents with altered mental status and confusion.  The study done to evaluate for seizures.  MEDICATIONS:  Aricept, aspirin, Lipitor, Lovenox, folic acid, insulin, Keppra, Lopressor, Coumadin.  ANALYSIS:  A 16-channel recording using standard 10/20 measurement is conducted for 23 minutes.  There is a posterior dominant rhythm of 8 Hz, which attenuates with eye opening.  There is beta activity observed in the frontal areas.  Awake and sleep activities are recorded.  Stage 2 sleep with K complexes and sleep spindles are observed.  Photic stimulation and hyperventilation were not carried out.  There is no focal or lateralized slowing.  There is no epileptiform activity observed.  IMPRESSION:  Normal recording of awake and sleep states.     Camary Sosa A. Gerilyn Pilgrim, M.D.     KAD/MEDQ  D:  05/19/2012  T:  05/19/2012  Job:  [id number]

## 2012-06-11 ENCOUNTER — Other Ambulatory Visit: Payer: Self-pay | Admitting: Family Medicine

## 2012-06-14 ENCOUNTER — Ambulatory Visit: Payer: Medicare Other | Admitting: Gastroenterology

## 2012-06-14 ENCOUNTER — Encounter: Payer: Self-pay | Admitting: Gastroenterology

## 2012-06-14 ENCOUNTER — Ambulatory Visit (INDEPENDENT_AMBULATORY_CARE_PROVIDER_SITE_OTHER): Payer: Medicare Other | Admitting: Gastroenterology

## 2012-06-14 ENCOUNTER — Telehealth: Payer: Self-pay | Admitting: Family Medicine

## 2012-06-14 VITALS — BP 156/74 | HR 59 | Temp 97.4°F | Ht 65.0 in | Wt 182.6 lb

## 2012-06-14 DIAGNOSIS — K922 Gastrointestinal hemorrhage, unspecified: Secondary | ICD-10-CM

## 2012-06-14 NOTE — Patient Instructions (Addendum)
Please take supplemental fiber daily (examples are Metamucil or Benefiber).  Continue taking the probiotic daily.  You may take 1/2 to 1 tablet of imodium as needed for diarrhea.   Return in Sept 2014 to talk with Dr. Jena Gauss about another colonoscopy.   Have a wonderful summer!

## 2012-06-15 NOTE — Telephone Encounter (Signed)
meds refilled 

## 2012-06-15 NOTE — Assessment & Plan Note (Signed)
Post-polypectomy bleed with recent admission in the setting of anticoagulation. She is doing quite well now, and she notes an improvement in chronic loose stools with the addition of a probiotic. She was unable to tolerate the taste of Questran, which had been previously recommended. Would benefit from supplemental fiber, possible Imodium prn. Could be dealing with diabetic enteropathy, IBS, ?pancreatic insufficiency. She will need surveillance colonoscopy due to remaining polyps that were not manipulated at time of colonoscopy. She will return in September to discuss proceeding with a colonoscopy. Due to her increased risk of bleeding, I have asked that she return to see Dr. Jena Gauss for discussion of appropriate management of Coumadin prior to procedure.

## 2012-06-15 NOTE — Progress Notes (Signed)
Referring Provider: Salley Scarlet, MD Primary Care Physician:  Milinda Antis, MD Primary GI: Dr. Jena Gauss   Chief Complaint  Patient presents with  . Follow-up    HPI:   Helen [name] is an 79 year old female who presents today in hospital follow-up after admission for post-polypectomy bleed in the setting of anticoagulation.05/03/12 TCS with multiple colonic polyps, s/p piecemeal polypectomy, path with fragments of tubular adenomas. Diverticulosis noted. She required 2 units PRBCs and 2 units FFP during admission. Colonoscopy was performed secondary to loose stools. She returns without any further rectal bleeding. Denies abdominal pain N/V. Has loose stool about 2-3 times per week. Prior stool studies all negative except for positive lactoferrin. She was unable to tolerate Questran, which was recommended per Dr. Jena Gauss after negative biopsies and stool studies. She has noted some improvement with a probiotic. She really has no complaints.    Past Medical History  Diagnosis Date  . Stroke   . Atrial fibrillation   . Diabetes mellitus   . Hypertension   . Coronary artery disease   . Hyperlipidemia 05/25/2011  . Anemia 05/25/2011  . Seizures     Noted at [facility] in Jan 2012, started on Keppra  . Depression   . Arthritis     Past Surgical History  Procedure Laterality Date  . Appendectomy    . Abdominal hysterectomy    . Back surgery  1990s  . Colonoscopy  01/29/2004    [facility]:  rectal polyp-cannot remember?path  . Colonoscopy N/A 05/03/2012    RMR: multiple colonic polyps, s/p piecemeal polypectomy, diverticulosis, fragments of tubular adenomas    Current Outpatient Prescriptions  Medication Sig Dispense Refill  . acetaminophen (TYLENOL) 500 MG tablet Take 500 mg by mouth 2 (two) times daily as needed for pain.      Marland Kitchen atorvastatin (LIPITOR) 10 MG tablet Take 10 mg by mouth every morning.       . diphenoxylate-atropine (LOMOTIL) 2.5-0.025 MG per tablet Take 1 tablet by mouth  4 (four) times daily as needed for diarrhea or loose stools.      . donepezil (ARICEPT) 10 MG tablet Take 10 mg by mouth every morning.       . escitalopram (LEXAPRO) 5 MG tablet Take 5 mg by mouth daily.      . ferrous sulfate 325 (65 FE) MG tablet Take 325 mg by mouth daily with breakfast.      . fesoterodine (TOVIAZ) 8 MG TB24 Take 8 mg by mouth every morning.       . folic acid (FOLVITE) 1 MG tablet Take 1 mg by mouth every morning. At 700      . glipiZIDE (GLUCOTROL) 5 MG tablet Take 5 mg by mouth every morning.       . levETIRAcetam (KEPPRA) 500 MG tablet Take 500 mg by mouth 2 (two) times daily.      Marland Kitchen lisinopril (PRINIVIL,ZESTRIL) 10 MG tablet Take 10 mg by mouth every morning.       . metoprolol tartrate (LOPRESSOR) 25 MG tablet Take 12.5 mg by mouth 2 (two) times daily.      . mirabegron ER (MYRBETRIQ) 50 MG TB24 Take 50 mg by mouth every morning.       . Multiple Vitamins-Minerals (CENTRUM SILVER ULTRA WOMENS PO) Take 1 tablet by mouth every morning. At 700      . potassium chloride (K-DUR) 10 MEQ tablet Take 10 mEq by mouth 2 (two) times daily.      . protective  barrier (RESTORE) CREA Apply 1 application topically daily.      Marland Kitchen warfarin (COUMADIN) 5 MG tablet Take 5 mg by mouth daily.      Marland Kitchen amoxicillin-clavulanate (AUGMENTIN) 875-125 MG per tablet Take 1 tablet by mouth 2 (two) times daily.  10 tablet  0  . metoprolol tartrate (LOPRESSOR) 25 MG tablet TAKE 1/2 TABLET BY MOUTH TWICE DAILY  30 tablet  0  . zolpidem (AMBIEN) 5 MG tablet TAKE 1 TABLET BY MOUTH EVERY NIGHT AT BEDTIME  14 tablet  0   No current facility-administered medications for this visit.    Allergies as of 06/14/2012 - Review Complete 06/14/2012  Allergen Reaction Noted  . Ciprofloxacin  05/23/2011  . Niacin and related Hives 05/23/2011  . Statins Other (See Comments) 07/15/2011    Family History  Problem Relation Age of Onset  . Heart disease Father     History   Social History  . Marital Status:  Widowed    Spouse Name: N/A    Number of Children: 1  . Years of Education: N/A   Occupational History  . retired, Audiological scientist    Social History Main Topics  . Smoking status: Never Smoker   . Smokeless tobacco: None  . Alcohol Use: No  . Drug Use: No  . Sexually Active: Yes    Birth Control/ Protection: Surgical   Other Topics Concern  . None   Social History Narrative   Lives w/ 1 son   Lost 1 son MI   Caregiver at night & CN 4 hrs during day          Review of Systems: Negative unless mentioned in HPI  Physical Exam: BP 156/74  Pulse 59  Temp(Src) 97.4 F (36.3 C) (Oral)  Ht 5\' 5"  (1.651 m)  Wt 182 lb 9.6 oz (82.827 kg)  BMI 30.39 kg/m2 General:   Alert and oriented. No distress noted. Pleasant and cooperative.  Head:  Normocephalic and atraumatic. Eyes:  Conjuctiva clear without scleral icterus. Mouth:  Oral mucosa pink and moist. Good dentition. No lesions. Heart:  S1, S2 present without murmurs, rubs, or gallops.  Abdomen:  +BS, soft, non-tender and non-distended. No rebound or guarding. No HSM or masses noted. Msk:  Symmetrical without gross deformities. Normal posture. Extremities:  Without edema. Neurologic:  Alert and  oriented x4;  grossly normal neurologically. Skin:  Intact without significant lesions or rashes. Psych:  Alert and cooperative. Normal mood and affect.

## 2012-06-18 ENCOUNTER — Other Ambulatory Visit: Payer: Self-pay | Admitting: Family Medicine

## 2012-06-18 NOTE — Progress Notes (Signed)
Forwarded to PCP.

## 2012-06-19 ENCOUNTER — Encounter: Payer: Self-pay | Admitting: Family Medicine

## 2012-06-19 ENCOUNTER — Ambulatory Visit (INDEPENDENT_AMBULATORY_CARE_PROVIDER_SITE_OTHER): Payer: Medicare Other | Admitting: Family Medicine

## 2012-06-19 VITALS — BP 114/62 | HR 79 | Resp 16 | Ht 65.0 in | Wt 181.0 lb

## 2012-06-19 DIAGNOSIS — IMO0002 Reserved for concepts with insufficient information to code with codable children: Secondary | ICD-10-CM

## 2012-06-19 DIAGNOSIS — F0390 Unspecified dementia without behavioral disturbance: Secondary | ICD-10-CM

## 2012-06-19 DIAGNOSIS — I639 Cerebral infarction, unspecified: Secondary | ICD-10-CM

## 2012-06-19 DIAGNOSIS — M179 Osteoarthritis of knee, unspecified: Secondary | ICD-10-CM

## 2012-06-19 DIAGNOSIS — I6340 Cerebral infarction due to embolism of unspecified cerebral artery: Secondary | ICD-10-CM

## 2012-06-19 DIAGNOSIS — M1710 Unilateral primary osteoarthritis, unspecified knee: Secondary | ICD-10-CM

## 2012-06-19 DIAGNOSIS — Z7409 Other reduced mobility: Secondary | ICD-10-CM

## 2012-06-19 DIAGNOSIS — R69 Illness, unspecified: Secondary | ICD-10-CM

## 2012-06-19 NOTE — Patient Instructions (Signed)
Cancel follow up for the 19th Continue current medications F/U 6 weeks at [facility]

## 2012-06-20 LAB — POCT INR: INR: 4.2

## 2012-06-21 ENCOUNTER — Encounter: Payer: Self-pay | Admitting: Family Medicine

## 2012-06-21 DIAGNOSIS — Z7409 Other reduced mobility: Secondary | ICD-10-CM | POA: Insufficient documentation

## 2012-06-21 NOTE — Progress Notes (Addendum)
  Subjective:    Patient ID: Helen [name], female    DOB: 12/18/1924, 79 y.o.   MRN: [id number]  HPI   Patient here for mobility exam. She was recently admitted for GI bleed after having colonoscopy and polyps removed she was on Coumadin at the time. She was then readmitted after her Coumadin was stopped secondary to altered mental status was concern for TIA. She's currently at home and is doing well. She is cared for 24 hours a day. She needs assistance with her ADLs such as bathing . Power wheelchair will assist her in movement around her first level home to the restroom for toileting and dining area for meals-eating at table with family  She has used a cane and walker with wheels in the past however her weakness has deteriorated over the past few months where she is unable to stand steady with a walker or cane, her coordination and balance are off. . She's unable to operate a manual wheelchair at secondary to upper extremity weakness and difficulties with her posture kyphosis. She's also high risk of fall with ambulation. She is unable to use a scooter secondary to limitations with the tiller steering system and due to her kyphosis and decreased strength in the upper body she needs more support seeding. She also will need help with transfer and the tiller system his obstructive   Review of Systems   GEN- denies fatigue, fever, weight loss,weakness, recent illness HEENT- denies eye drainage, change in vision, nasal discharge, CVS- denies chest pain, palpitations RESP- denies SOB, cough, wheeze ABD- denies N/V, +change in stools, abd pain GU- denies dysuria, hematuria, dribbling, incontinence MSK- + joint pain, muscle aches, injury Neuro- denies headache, dizziness, syncope, seizure activity      Objective:   Physical Exam GEN- NAD, alert and oriented x3, well appearing, sitting in wheelchair HEENT-  PERRL,non injected sclera, pink conjunctiva, MMM, oropharynx clear CVS- RRR, no  murmur RESP-CTAB ABD-NABS,soft,NT,ND EXT- No edema Pulses- Radial,2+ Neuro- able to stand and walk a few short shuffled steps, sensation grossly in tact, decreased strength 4+/5 RUE  4+/5LUE,  4/5 RLE 4+/5 LLE, DTR equal bilat, good tone upper and lower ext  MSK- hunched back with kyphosis when she walks, Decreased ROM RUE, Knees- good /flexion, decreased ROM extension decreased ROM HIPS bilat ,decreased coordination with ambulation, decreased ROM shoulder/rotator cuff- has at least 20% limitation in shoulders, hips Pysch- not depressed or overly anxious , mentation in tact,       Assessment & Plan:     Patient is physically and mentally able to operate a power wheelchair in her home

## 2012-06-21 NOTE — Assessment & Plan Note (Signed)
Patient has some hemiparesis from her stroke. She's been recently reevaluated in emergency room and concern she's been having mini strokes as well.

## 2012-06-21 NOTE — Assessment & Plan Note (Signed)
She does have a history of early dementia however she is cognitively able to operate power wheelchair and participate actively in ADLs

## 2012-06-21 NOTE — Assessment & Plan Note (Addendum)
Osteoarthritis of knee as well as generalized osteoarthritis in her hips and her upper extremities. She would benefit from use of motorized wheelchair

## 2012-06-21 NOTE — Assessment & Plan Note (Signed)
Mobility exam completed -see above for detail Power wheelchair has been ordered

## 2012-06-22 ENCOUNTER — Ambulatory Visit (INDEPENDENT_AMBULATORY_CARE_PROVIDER_SITE_OTHER): Payer: Self-pay | Admitting: Pharmacist Clinician (PhC)/ Clinical Pharmacy Specialist

## 2012-06-22 DIAGNOSIS — Z7901 Long term (current) use of anticoagulants: Secondary | ICD-10-CM

## 2012-06-22 DIAGNOSIS — I4891 Unspecified atrial fibrillation: Secondary | ICD-10-CM

## 2012-06-22 DIAGNOSIS — I480 Paroxysmal atrial fibrillation: Secondary | ICD-10-CM

## 2012-06-25 ENCOUNTER — Ambulatory Visit: Payer: Medicare Other | Admitting: Family Medicine

## 2012-06-28 NOTE — Progress Notes (Signed)
This is an addendum to original power wheelchair assessment performed on 06/19/12  1. ( Addendum to scale of limitations for manual chair)- Patient is unable to use a manual wheelchair secondary to decreased strength and range of motion in her upper extremities  which will be required to manually wheel the  Chair. She also has severe kyphosis which makes it difficult to maintain adequate upper body strength and posture to operate a manual wheelchair. Do to her generalized weakness and decreased strength and range of motion in her upper and lower body including her shoulders, hips, knees she would be unable to sustain wheeling a chair manually in the home to complete her ADLs  2. the medical need for power mobility devices not related to a liability or personal injury case

## 2012-07-01 ENCOUNTER — Encounter: Payer: Self-pay | Admitting: *Deleted

## 2012-07-06 ENCOUNTER — Ambulatory Visit (INDEPENDENT_AMBULATORY_CARE_PROVIDER_SITE_OTHER): Payer: Self-pay | Admitting: Pharmacist Clinician (PhC)/ Clinical Pharmacy Specialist

## 2012-07-06 DIAGNOSIS — I480 Paroxysmal atrial fibrillation: Secondary | ICD-10-CM

## 2012-07-06 DIAGNOSIS — Z7901 Long term (current) use of anticoagulants: Secondary | ICD-10-CM

## 2012-07-06 DIAGNOSIS — I4891 Unspecified atrial fibrillation: Secondary | ICD-10-CM

## 2012-07-06 LAB — POCT INR: INR: 3.5

## 2012-07-13 ENCOUNTER — Ambulatory Visit (INDEPENDENT_AMBULATORY_CARE_PROVIDER_SITE_OTHER): Payer: Self-pay | Admitting: Pharmacist Clinician (PhC)/ Clinical Pharmacy Specialist

## 2012-07-13 DIAGNOSIS — I480 Paroxysmal atrial fibrillation: Secondary | ICD-10-CM

## 2012-07-13 DIAGNOSIS — Z7901 Long term (current) use of anticoagulants: Secondary | ICD-10-CM

## 2012-07-13 DIAGNOSIS — I4891 Unspecified atrial fibrillation: Secondary | ICD-10-CM

## 2012-07-18 ENCOUNTER — Other Ambulatory Visit: Payer: Self-pay | Admitting: Family Medicine

## 2012-07-19 ENCOUNTER — Ambulatory Visit (INDEPENDENT_AMBULATORY_CARE_PROVIDER_SITE_OTHER): Payer: Self-pay | Admitting: Pharmacist Clinician (PhC)/ Clinical Pharmacy Specialist

## 2012-07-19 DIAGNOSIS — Z7901 Long term (current) use of anticoagulants: Secondary | ICD-10-CM

## 2012-07-19 DIAGNOSIS — I4891 Unspecified atrial fibrillation: Secondary | ICD-10-CM

## 2012-07-19 DIAGNOSIS — I480 Paroxysmal atrial fibrillation: Secondary | ICD-10-CM

## 2012-07-23 ENCOUNTER — Emergency Department (HOSPITAL_COMMUNITY): Payer: Medicare Other

## 2012-07-23 ENCOUNTER — Telehealth: Payer: Self-pay | Admitting: Family Medicine

## 2012-07-23 ENCOUNTER — Emergency Department (HOSPITAL_COMMUNITY)
Admission: EM | Admit: 2012-07-23 | Discharge: 2012-07-23 | Disposition: A | Discharge status: Home / Self Care | Payer: Medicare Other | Attending: Emergency Medicine | Admitting: Emergency Medicine

## 2012-07-23 ENCOUNTER — Encounter (HOSPITAL_COMMUNITY): Payer: Self-pay | Admitting: *Deleted

## 2012-07-23 DIAGNOSIS — I10 Essential (primary) hypertension: Secondary | ICD-10-CM | POA: Insufficient documentation

## 2012-07-23 DIAGNOSIS — F3289 Other specified depressive episodes: Secondary | ICD-10-CM | POA: Insufficient documentation

## 2012-07-23 DIAGNOSIS — Z87440 Personal history of urinary (tract) infections: Secondary | ICD-10-CM | POA: Insufficient documentation

## 2012-07-23 DIAGNOSIS — F29 Unspecified psychosis not due to a substance or known physiological condition: Secondary | ICD-10-CM | POA: Insufficient documentation

## 2012-07-23 DIAGNOSIS — G40909 Epilepsy, unspecified, not intractable, without status epilepticus: Secondary | ICD-10-CM | POA: Insufficient documentation

## 2012-07-23 DIAGNOSIS — Z8673 Personal history of transient ischemic attack (TIA), and cerebral infarction without residual deficits: Secondary | ICD-10-CM | POA: Insufficient documentation

## 2012-07-23 DIAGNOSIS — F329 Major depressive disorder, single episode, unspecified: Secondary | ICD-10-CM | POA: Insufficient documentation

## 2012-07-23 DIAGNOSIS — R410 Disorientation, unspecified: Secondary | ICD-10-CM

## 2012-07-23 DIAGNOSIS — E785 Hyperlipidemia, unspecified: Secondary | ICD-10-CM | POA: Insufficient documentation

## 2012-07-23 DIAGNOSIS — Z79899 Other long term (current) drug therapy: Secondary | ICD-10-CM | POA: Insufficient documentation

## 2012-07-23 DIAGNOSIS — I4891 Unspecified atrial fibrillation: Secondary | ICD-10-CM | POA: Insufficient documentation

## 2012-07-23 DIAGNOSIS — D649 Anemia, unspecified: Secondary | ICD-10-CM | POA: Insufficient documentation

## 2012-07-23 DIAGNOSIS — I2510 Atherosclerotic heart disease of native coronary artery without angina pectoris: Secondary | ICD-10-CM | POA: Insufficient documentation

## 2012-07-23 DIAGNOSIS — R51 Headache: Secondary | ICD-10-CM | POA: Insufficient documentation

## 2012-07-23 DIAGNOSIS — Z7901 Long term (current) use of anticoagulants: Secondary | ICD-10-CM | POA: Insufficient documentation

## 2012-07-23 DIAGNOSIS — M129 Arthropathy, unspecified: Secondary | ICD-10-CM | POA: Insufficient documentation

## 2012-07-23 LAB — CBC WITH DIFFERENTIAL/PLATELET
Basophils Absolute: 0 10*3/uL (ref 0.0–0.1)
Eosinophils Absolute: 0.1 10*3/uL (ref 0.0–0.7)
Eosinophils Relative: 1 % (ref 0–5)
HCT: 36.5 % (ref 36.0–46.0)
Lymphocytes Relative: 15 % (ref 12–46)
MCH: 31.3 pg (ref 26.0–34.0)
MCHC: 34.8 g/dL (ref 30.0–36.0)
MCV: 89.9 fL (ref 78.0–100.0)
Monocytes Absolute: 0.4 10*3/uL (ref 0.1–1.0)
RDW: 12.9 % (ref 11.5–15.5)
WBC: 10.7 10*3/uL — ABNORMAL HIGH (ref 4.0–10.5)

## 2012-07-23 LAB — PROTIME-INR
INR: 2.67 — ABNORMAL HIGH (ref 0.00–1.49)
Prothrombin Time: 27.1 seconds — ABNORMAL HIGH (ref 11.6–15.2)

## 2012-07-23 LAB — URINALYSIS, ROUTINE W REFLEX MICROSCOPIC
Bilirubin Urine: NEGATIVE
Glucose, UA: NEGATIVE mg/dL
Ketones, ur: NEGATIVE mg/dL
Protein, ur: NEGATIVE mg/dL
pH: 6 (ref 5.0–8.0)

## 2012-07-23 LAB — BASIC METABOLIC PANEL
BUN: 14 mg/dL (ref 6–23)
CO2: 22 mEq/L (ref 19–32)
Chloride: 95 mEq/L — ABNORMAL LOW (ref 96–112)
Creatinine, Ser: 1.01 mg/dL (ref 0.50–1.10)
Glucose, Bld: 132 mg/dL — ABNORMAL HIGH (ref 70–99)

## 2012-07-23 LAB — TROPONIN I: Troponin I: <0.3 ng/mL (ref <0.30)

## 2012-07-23 LAB — URINE MICROSCOPIC-ADD ON

## 2012-07-23 MED ORDER — SODIUM CHLORIDE 0.9 % IV BOLUS (SEPSIS): 500.0000 mL | Freq: Once | INTRAVENOUS | Status: DC

## 2012-07-23 MED ORDER — SODIUM CHLORIDE 0.9 % IV SOLN: INTRAVENOUS | Status: DC

## 2012-07-23 MED ORDER — SODIUM CHLORIDE 0.9 % IV BOLUS (SEPSIS)
250.0000 mL | Freq: Once | INTRAVENOUS | Status: AC
  Administered 2012-07-23: 250 mL via INTRAVENOUS

## 2012-07-23 MED ORDER — ONDANSETRON HCL 4 MG/2ML IJ SOLN
INTRAMUSCULAR | Status: AC
  Administered 2012-07-23: 4 mg
  Filled 2012-07-23: qty 2

## 2012-07-23 NOTE — Telephone Encounter (Signed)
Send her to ER for evaluation please

## 2012-07-23 NOTE — ED Notes (Signed)
Calmer, not as restless as upon arrival

## 2012-07-23 NOTE — ED Provider Notes (Signed)
Pt and family member informed of plan by Dr. Deretha Emory - family now states they'd like to take her home and return in AM if no better - pt in agreement.  They decline further ER treatment including hydration at this time.  Vida Roller, MD 07/23/12 2255

## 2012-07-23 NOTE — ED Notes (Signed)
Pt states she woke this morning feeling weak and unwell. Very weak now, needing much assistance to get on stretcher. Restless on stretcher, c/o of being cold. (son states this is normal)

## 2012-07-23 NOTE — ED Provider Notes (Addendum)
History     CSN: [id number]  Arrival date & time 07/23/12  1858   First MD Initiated Contact with Patient 07/23/12 1951      Chief Complaint  Patient presents with  . Hyperglycemia    (Consider location/radiation/quality/duration/timing/severity/associated sxs/prior treatment) The history is provided by the patient and a relative. The history is limited by the condition of the patient.   level V caveat applies to the history due to the patient's confusion.  Patient brought in by family members for increased confusion today. No specific symptoms or complaints. Family states in the past when this has occurred that the she has often had a urinary tract infection. Patient was complaining of a headache earlier in the day but that has resolved.  Past Medical History  Diagnosis Date  . Stroke 02/2011    large right frontal lobe hemispheric stroke  . Atrial fibrillation   . Diabetes mellitus   . Hypertension   . Coronary artery disease   . Hyperlipidemia 05/25/2011  . Anemia 05/25/2011  . Seizures     Noted at [facility] in Jan 2012, started on Keppra  . Depression   . Arthritis   . TIA (transient ischemic attack) 05/24/2011    2D Echo EF 55%-60%    Past Surgical History  Procedure Laterality Date  . Appendectomy    . Abdominal hysterectomy    . Back surgery  1990s  . Colonoscopy  01/29/2004    [facility]:  rectal polyp-cannot remember?path  . Colonoscopy N/A 05/03/2012    RMR: multiple colonic polyps, s/p piecemeal polypectomy, diverticulosis, fragments of tubular adenomas    Family History  Problem Relation Age of Onset  . Heart disease Father     History  Substance Use Topics  . Smoking status: Never Smoker   . Smokeless tobacco: Not on file  . Alcohol Use: No    OB History   Grav Para Term Preterm Abortions TAB SAB Ect Mult Living                  Review of Systems  Unable to perform ROS Neurological: Positive for headaches.  Psychiatric/Behavioral:  Positive for confusion.   level V caveat applies the review of systems due to the patient's confusion.  Allergies  Ciprofloxacin; Niacin and related; and Statins  Home Medications   Current Outpatient Rx  Name  Route  Sig  Dispense  Refill  . acetaminophen (TYLENOL) 500 MG tablet   Oral   Take 500 mg by mouth 2 (two) times daily as needed for pain.         Marland Kitchen atorvastatin (LIPITOR) 10 MG tablet   Oral   Take 10 mg by mouth every morning.          . donepezil (ARICEPT) 10 MG tablet   Oral   Take 10 mg by mouth every morning.          . escitalopram (LEXAPRO) 5 MG tablet   Oral   Take 5 mg by mouth daily.         . fesoterodine (TOVIAZ) 8 MG TB24   Oral   Take 8 mg by mouth at bedtime.          . folic acid (FOLVITE) 1 MG tablet   Oral   Take 1 mg by mouth every morning. At 700         . glipiZIDE (GLUCOTROL) 5 MG tablet   Oral   Take 5 mg by mouth every morning.          Marland Kitchen  ibuprofen (ADVIL,MOTRIN) 200 MG tablet   Oral   Take 200 mg by mouth every 6 (six) hours as needed for pain.         Marland Kitchen levETIRAcetam (KEPPRA) 500 MG tablet   Oral   Take 500 mg by mouth 2 (two) times daily.         Marland Kitchen lisinopril (PRINIVIL,ZESTRIL) 10 MG tablet   Oral   Take 10 mg by mouth every morning.          . metoprolol tartrate (LOPRESSOR) 25 MG tablet   Oral   Take 12.5 mg by mouth 2 (two) times daily.         . mirabegron ER (MYRBETRIQ) 50 MG TB24   Oral   Take 50 mg by mouth at bedtime.          . Multiple Vitamins-Minerals (CENTRUM SILVER ULTRA WOMENS PO)   Oral   Take 1 tablet by mouth every morning. At 700         . potassium chloride (K-DUR) 10 MEQ tablet   Oral   Take 10 mEq by mouth 2 (two) times daily.         Marland Kitchen sulfamethoxazole-trimethoprim (BACTRIM DS) 800-160 MG per tablet   Oral   Take 1 tablet by mouth every morning.         . warfarin (COUMADIN) 4 MG tablet   Oral   Take 4-8 mg by mouth daily. Takes 8mg  daily except take 4mg  on  Wednesdays and Sundays         . zolpidem (AMBIEN) 5 MG tablet   Oral   Take 5 mg by mouth at bedtime as needed for sleep.         . diphenoxylate-atropine (LOMOTIL) 2.5-0.025 MG per tablet   Oral   Take 1 tablet by mouth 4 (four) times daily as needed for diarrhea or loose stools.         . protective barrier (RESTORE) CREA   Topical   Apply 1 application topically daily.           BP 172/94  Pulse 68  Temp(Src) 100.2 F (37.9 C) (Rectal)  Resp 18  Ht 5\' 7"  (1.702 m)  Wt 185 lb (83.915 kg)  BMI 28.97 kg/m2  SpO2 96%  Physical Exam  Nursing note and vitals reviewed. Constitutional: She appears well-developed and well-nourished. No distress.  HENT:  Head: Normocephalic and atraumatic.  Mouth/Throat: Oropharynx is clear and moist.  Eyes: Conjunctivae are normal. Pupils are equal, round, and reactive to light.  Neck: Normal range of motion.  Cardiovascular: Normal rate, regular rhythm and normal heart sounds.   No murmur heard. Pulmonary/Chest: Effort normal and breath sounds normal.  Abdominal: Soft. Bowel sounds are normal. There is no tenderness.  Musculoskeletal: Normal range of motion. She exhibits no edema.  Neurological:  Alert but confused. Moving all 4 extremities.  Skin: Skin is warm. No rash noted. No erythema.    ED Course  Procedures (including critical care time)  Labs Reviewed  URINALYSIS, ROUTINE W REFLEX MICROSCOPIC - Abnormal; Notable for the following:    Specific Gravity, Urine >1.030 (*)    Hgb urine dipstick TRACE (*)    All other components within normal limits  BASIC METABOLIC PANEL - Abnormal; Notable for the following:    Sodium 131 (*)    Chloride 95 (*)    Glucose, Bld 132 (*)    GFR calc non Af Amer 49 (*)    GFR calc Af  Amer 57 (*)    All other components within normal limits  CBC WITH DIFFERENTIAL - Abnormal; Notable for the following:    WBC 10.7 (*)    Neutrophils Relative % 80 (*)    Neutro Abs 8.6 (*)    All other  components within normal limits  GLUCOSE, CAPILLARY - Abnormal; Notable for the following:    Glucose-Capillary 121 (*)    All other components within normal limits  URINE MICROSCOPIC-ADD ON - Abnormal; Notable for the following:    Squamous Epithelial / LPF FEW (*)    All other components within normal limits  TROPONIN I   Dg Chest 1 View  07/23/2012   *RADIOLOGY REPORT*  Clinical Data: 79 year old female hyperglycemia weakness nausea. Atrial fibrillation.  CHEST - 1 VIEW  Comparison: 05/16/2012 and earlier.  Findings: Portable semi upright AP view at 2048 hours.  The patient is mildly rotated to the right.  Lower lung volumes.  Mildly increased bibasilar opacity most resembles atelectasis.  No consolidation.  No definite pleural effusion, pneumothorax or pulmonary edema.  Stable cardiomegaly and mediastinal contours.  IMPRESSION: Lower lung volumes with increased bibasilar atelectasis.   Original Report Authenticated By: Erskine Speed, M.D.   Ct Head Wo Contrast  07/23/2012   *RADIOLOGY REPORT*  Clinical Data: 79 year old female with nausea vomiting hyperglycemia weakness.  Atrial fibrillation and history of stroke.  CT HEAD WITHOUT CONTRAST  Technique:  Contiguous axial images were obtained from the base of the skull through the vertex without contrast.  Comparison: 05/17/2012 and earlier.  Findings: Visualized paranasal sinuses and mastoids are clear. Visualized orbits and scalp soft tissues are within normal limits. No acute osseous abnormality identified.  Stable cerebral volume.  No ventriculomegaly.  Patchy confluent cerebral white matter hypodensity is not significantly changed. Dural calcifications.  There is a degree of intracranial artery dolichoectasia re-identified. No suspicious intracranial vascular hyperdensity. No midline shift, mass effect, or evidence of mass lesion.  No acute intracranial hemorrhage identified.  No definite acute cortically based infarct.  IMPRESSION: No acute  intracranial abnormality.  Chronic white matter changes probably due to small vessel disease.   Original Report Authenticated By: Erskine Speed, M.D.   Results for orders placed during the hospital encounter of 07/23/12  URINALYSIS, ROUTINE W REFLEX MICROSCOPIC      Result Value Range   Color, Urine YELLOW  YELLOW   APPearance CLEAR  CLEAR   Specific Gravity, Urine >1.030 (*) 1.005 - 1.030   pH 6.0  5.0 - 8.0   Glucose, UA NEGATIVE  NEGATIVE mg/dL   Hgb urine dipstick TRACE (*) NEGATIVE   Bilirubin Urine NEGATIVE  NEGATIVE   Ketones, ur NEGATIVE  NEGATIVE mg/dL   Protein, ur NEGATIVE  NEGATIVE mg/dL   Urobilinogen, UA 0.2  0.0 - 1.0 mg/dL   Nitrite NEGATIVE  NEGATIVE   Leukocytes, UA NEGATIVE  NEGATIVE  BASIC METABOLIC PANEL      Result Value Range   Sodium 131 (*) 135 - 145 mEq/L   Potassium 4.3  3.5 - 5.1 mEq/L   Chloride 95 (*) 96 - 112 mEq/L   CO2 22  19 - 32 mEq/L   Glucose, Bld 132 (*) 70 - 99 mg/dL   BUN 14  6 - 23 mg/dL   Creatinine, Ser 4.09  0.50 - 1.10 mg/dL   Calcium 9.3  8.4 - 81.1 mg/dL   GFR calc non Af Amer 49 (*) >90 mL/min   GFR calc Af Amer 57 (*) >  90 mL/min  CBC WITH DIFFERENTIAL      Result Value Range   WBC 10.7 (*) 4.0 - 10.5 K/uL   RBC 4.06  3.87 - 5.11 MIL/uL   Hemoglobin 12.7  12.0 - 15.0 g/dL   HCT 40.9  81.1 - 91.4 %   MCV 89.9  78.0 - 100.0 fL   MCH 31.3  26.0 - 34.0 pg   MCHC 34.8  30.0 - 36.0 g/dL   RDW 78.2  95.6 - 21.3 %   Platelets 199  150 - 400 K/uL   Neutrophils Relative % 80 (*) 43 - 77 %   Neutro Abs 8.6 (*) 1.7 - 7.7 K/uL   Lymphocytes Relative 15  12 - 46 %   Lymphs Abs 1.6  0.7 - 4.0 K/uL   Monocytes Relative 4  3 - 12 %   Monocytes Absolute 0.4  0.1 - 1.0 K/uL   Eosinophils Relative 1  0 - 5 %   Eosinophils Absolute 0.1  0.0 - 0.7 K/uL   Basophils Relative 0  0 - 1 %   Basophils Absolute 0.0  0.0 - 0.1 K/uL  TROPONIN I      Result Value Range   Troponin I <0.30  <0.30 ng/mL  GLUCOSE, CAPILLARY      Result Value Range    Glucose-Capillary 121 (*) 70 - 99 mg/dL  URINE MICROSCOPIC-ADD ON      Result Value Range   Squamous Epithelial / LPF FEW (*) RARE   WBC, UA 0-2  <3 WBC/hpf   RBC / HPF 3-6  <3 RBC/hpf   Bacteria, UA RARE  RARE     Date: 07/23/2012  Rate: 69  Rhythm: normal sinus rhythm  QRS Axis: left  Intervals: normal  ST/T Wave abnormalities: nonspecific ST/T changes  Conduction Disutrbances:none  Narrative Interpretation:   Old EKG Reviewed: unchanged No significant change in EKG compared to 05/15/2012     1. Confusion       MDM  Patient with acute onset of confusion today. Family members that she may have a urinary tract infection. However workup for source of the confusion is negative. No evidence urinary tract infection. Blood sugars are normal. White count is marginal at 10.7 no anemia. Chest x-ray negative for pneumonia or pulmonary edema. Head CT without any acute changes. Family wants patient admitted states that she still confused to go home. A Coumadin level is pending. We'll discuss with the hospitalist about admission. Patient does have a history of some dementia she is on Aricept.   Discussed with hospitalist do not feel that there is objective information to admit. Family will take patient home currently that was explained to them. We'll go ahead and continue IV hydration here see if there is improvement some improvement over the next few hours we'll need to recall the hospitalist for consideration for admission again.    Shelda Jakes, MD 07/23/12 0865  Shelda Jakes, MD 07/23/12 2221

## 2012-07-23 NOTE — Telephone Encounter (Signed)
Sugars very irratic today and is very confused.  Concerned possible TIA/Stoke.  Wants your adivise before he does anything.

## 2012-07-23 NOTE — ED Notes (Addendum)
Still restless, assisted with bedpan

## 2012-07-23 NOTE — ED Notes (Addendum)
Family reporting blood sugars elevated today, readings >300.  Family also believes pt has a UTI, due to generalized weakness.  pt denies other symptoms.

## 2012-07-23 NOTE — ED Notes (Signed)
Initially pt was to stay in ED for hydration and re-eval @ 1am. Son states that he would rather take her home and re-eval her status in the morning. Pt states she feels better than upon arrival. ERMD consulted and pt now for discharge

## 2012-07-24 ENCOUNTER — Telehealth: Payer: Self-pay | Admitting: Family Medicine

## 2012-07-24 NOTE — Telephone Encounter (Signed)
Pt was seen in the ED last evening

## 2012-07-24 NOTE — Telephone Encounter (Signed)
Noted, pt improved,  

## 2012-07-24 NOTE — Telephone Encounter (Signed)
Message copied by Samuella Cota on Tue Jul 24, 2012  5:13 PM ------      Message from: Milinda Antis F      Created: Tue Jul 24, 2012  2:02 PM      Regarding: Please call patient and see what blood sugars are running, seen in ED       See if she is taking glipizide 5mg   ------

## 2012-07-24 NOTE — Telephone Encounter (Signed)
Pt son states that her blood sugar this morning was 149 he says that is in normal limits for her. She is taking her glipizide 5mg  everyday he said that he is giving her immoduim every other day to help with the diarrhea and that seems to help. She is doing better today.

## 2012-07-25 LAB — POCT INR: INR: 1.6

## 2012-07-26 ENCOUNTER — Ambulatory Visit (INDEPENDENT_AMBULATORY_CARE_PROVIDER_SITE_OTHER): Payer: Self-pay | Admitting: Pharmacist Clinician (PhC)/ Clinical Pharmacy Specialist

## 2012-07-26 ENCOUNTER — Telehealth: Payer: Self-pay | Admitting: Cardiovascular Disease

## 2012-07-26 DIAGNOSIS — I480 Paroxysmal atrial fibrillation: Secondary | ICD-10-CM

## 2012-07-26 DIAGNOSIS — I4891 Unspecified atrial fibrillation: Secondary | ICD-10-CM

## 2012-07-26 DIAGNOSIS — Z7901 Long term (current) use of anticoagulants: Secondary | ICD-10-CM

## 2012-07-26 NOTE — Telephone Encounter (Signed)
LMOM for son- he does dosage adjustments for mother, asked him to call with info on why INR low, how treated.

## 2012-07-26 NOTE — Telephone Encounter (Signed)
Message forwarded to K. Alvstad, PharmD.  

## 2012-07-26 NOTE — Telephone Encounter (Signed)
INR was done yesterday at 2:51 P.M.-INR was 1.6!

## 2012-07-28 ENCOUNTER — Encounter: Payer: Self-pay | Admitting: Cardiovascular Disease

## 2012-07-31 ENCOUNTER — Other Ambulatory Visit: Payer: Self-pay | Admitting: Family Medicine

## 2012-07-31 ENCOUNTER — Other Ambulatory Visit: Payer: Self-pay | Admitting: Cardiovascular Disease

## 2012-08-01 ENCOUNTER — Telehealth: Payer: Self-pay | Admitting: Family Medicine

## 2012-08-02 ENCOUNTER — Telehealth: Payer: Self-pay | Admitting: Cardiovascular Disease

## 2012-08-02 MED ORDER — METOPROLOL TARTRATE 25 MG PO TABS: 12.5000 mg | ORAL_TABLET | Freq: Two times a day (BID) | ORAL | Status: DC

## 2012-08-02 NOTE — Telephone Encounter (Signed)
Med refilled.

## 2012-08-02 NOTE — Telephone Encounter (Signed)
Her INR was 3.6 yesterday!

## 2012-08-02 NOTE — Telephone Encounter (Signed)
Message forwarded to K. Alvstad, PharmD.  

## 2012-08-02 NOTE — Telephone Encounter (Signed)
LMOM for son to call back for dosing

## 2012-08-03 ENCOUNTER — Other Ambulatory Visit: Payer: Self-pay | Admitting: Family Medicine

## 2012-08-03 NOTE — Telephone Encounter (Signed)
Med refilled.

## 2012-08-08 ENCOUNTER — Ambulatory Visit (INDEPENDENT_AMBULATORY_CARE_PROVIDER_SITE_OTHER): Payer: Self-pay | Admitting: Pharmacist Clinician (PhC)/ Clinical Pharmacy Specialist

## 2012-08-08 DIAGNOSIS — Z7901 Long term (current) use of anticoagulants: Secondary | ICD-10-CM

## 2012-08-08 DIAGNOSIS — I4891 Unspecified atrial fibrillation: Secondary | ICD-10-CM

## 2012-08-08 DIAGNOSIS — I480 Paroxysmal atrial fibrillation: Secondary | ICD-10-CM

## 2012-08-17 ENCOUNTER — Ambulatory Visit (INDEPENDENT_AMBULATORY_CARE_PROVIDER_SITE_OTHER): Payer: Self-pay | Admitting: Pharmacist Clinician (PhC)/ Clinical Pharmacy Specialist

## 2012-08-17 DIAGNOSIS — I4891 Unspecified atrial fibrillation: Secondary | ICD-10-CM

## 2012-08-17 DIAGNOSIS — I480 Paroxysmal atrial fibrillation: Secondary | ICD-10-CM

## 2012-08-17 DIAGNOSIS — Z7901 Long term (current) use of anticoagulants: Secondary | ICD-10-CM

## 2012-08-30 ENCOUNTER — Ambulatory Visit (INDEPENDENT_AMBULATORY_CARE_PROVIDER_SITE_OTHER): Payer: Self-pay | Admitting: Pharmacist Clinician (PhC)/ Clinical Pharmacy Specialist

## 2012-08-30 DIAGNOSIS — Z7901 Long term (current) use of anticoagulants: Secondary | ICD-10-CM

## 2012-08-30 DIAGNOSIS — I4891 Unspecified atrial fibrillation: Secondary | ICD-10-CM

## 2012-08-30 DIAGNOSIS — I480 Paroxysmal atrial fibrillation: Secondary | ICD-10-CM

## 2012-09-05 ENCOUNTER — Telehealth: Payer: Self-pay | Admitting: Cardiovascular Disease

## 2012-09-05 ENCOUNTER — Ambulatory Visit (INDEPENDENT_AMBULATORY_CARE_PROVIDER_SITE_OTHER): Payer: Self-pay | Admitting: Pharmacist

## 2012-09-05 DIAGNOSIS — I4891 Unspecified atrial fibrillation: Secondary | ICD-10-CM

## 2012-09-05 DIAGNOSIS — I480 Paroxysmal atrial fibrillation: Secondary | ICD-10-CM

## 2012-09-05 DIAGNOSIS — Z7901 Long term (current) use of anticoagulants: Secondary | ICD-10-CM

## 2012-09-05 NOTE — Telephone Encounter (Signed)
Criitical INR is 3.5-Please call patient right away,

## 2012-09-05 NOTE — Telephone Encounter (Signed)
Returned call.  Informed customer service rep that INR will be addressed w/ pt.  Stated she cannot take messages on that line.  Informed INR will be addressed w/ pt and message not necessary.  Call to pt and spoke w/ pt's son.  Informed result received and pharmacist will be notified to f/u.  Verbalized understanding.  Message forwarded to S. Putt, PharmD.

## 2012-09-05 NOTE — Telephone Encounter (Signed)
Spoke with pt's son.  Dosing adjustments made to Coumadin.  See anti-coag note for details.

## 2012-09-12 ENCOUNTER — Other Ambulatory Visit: Payer: Self-pay | Admitting: Family Medicine

## 2012-09-13 ENCOUNTER — Ambulatory Visit (INDEPENDENT_AMBULATORY_CARE_PROVIDER_SITE_OTHER): Payer: Self-pay | Admitting: Pharmacist

## 2012-09-13 ENCOUNTER — Encounter: Payer: Self-pay | Admitting: Cardiovascular Disease

## 2012-09-13 DIAGNOSIS — Z7901 Long term (current) use of anticoagulants: Secondary | ICD-10-CM

## 2012-09-13 DIAGNOSIS — I4891 Unspecified atrial fibrillation: Secondary | ICD-10-CM

## 2012-09-13 DIAGNOSIS — I480 Paroxysmal atrial fibrillation: Secondary | ICD-10-CM

## 2012-09-13 NOTE — Telephone Encounter (Signed)
This encounter was created in error - please disregard.

## 2012-09-17 ENCOUNTER — Other Ambulatory Visit: Payer: Self-pay | Admitting: Family Medicine

## 2012-09-17 ENCOUNTER — Ambulatory Visit (INDEPENDENT_AMBULATORY_CARE_PROVIDER_SITE_OTHER): Payer: Self-pay | Admitting: Pharmacist Clinician (PhC)/ Clinical Pharmacy Specialist

## 2012-09-17 DIAGNOSIS — I4891 Unspecified atrial fibrillation: Secondary | ICD-10-CM

## 2012-09-17 DIAGNOSIS — Z7901 Long term (current) use of anticoagulants: Secondary | ICD-10-CM

## 2012-09-17 DIAGNOSIS — I480 Paroxysmal atrial fibrillation: Secondary | ICD-10-CM

## 2012-09-27 ENCOUNTER — Ambulatory Visit (INDEPENDENT_AMBULATORY_CARE_PROVIDER_SITE_OTHER): Payer: Self-pay | Admitting: Pharmacist Clinician (PhC)/ Clinical Pharmacy Specialist

## 2012-09-27 DIAGNOSIS — I480 Paroxysmal atrial fibrillation: Secondary | ICD-10-CM

## 2012-09-27 DIAGNOSIS — Z7901 Long term (current) use of anticoagulants: Secondary | ICD-10-CM

## 2012-09-27 DIAGNOSIS — I4891 Unspecified atrial fibrillation: Secondary | ICD-10-CM

## 2012-09-28 IMAGING — CR DG HIP (WITH OR WITHOUT PELVIS) 2-3V*L*
3 series · 3 of 3 positions shown · non-contrast
Comparison: None.

CLINICAL DATA: Fell 3 weeks ago with left hip pain and bruising

LEFT HIP - COMPLETE 2+ VIEW

[view not recorded (1 of 3)]
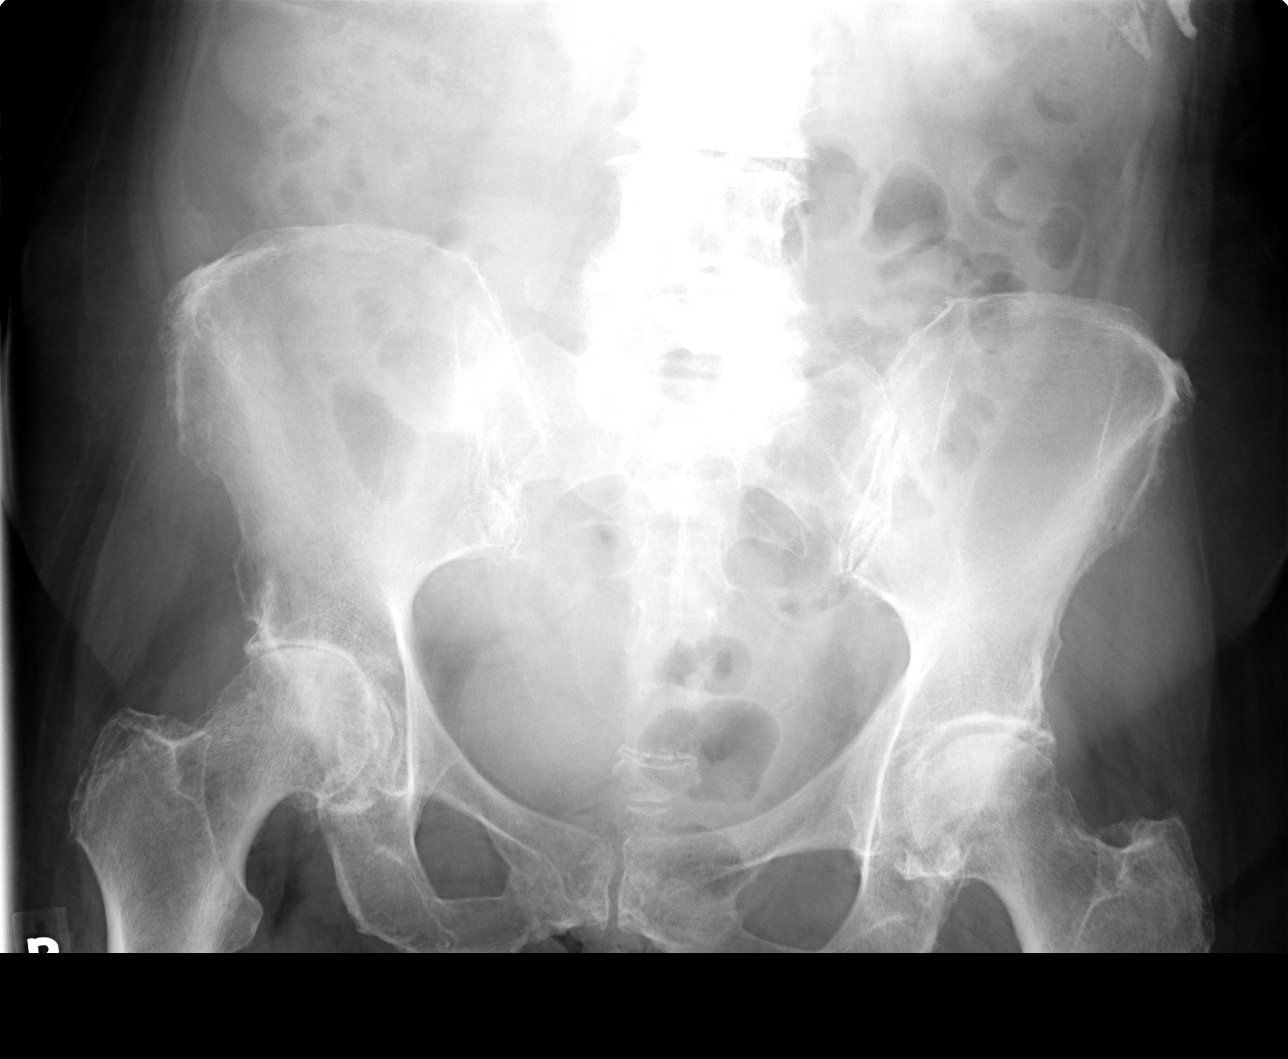

[view not recorded (2 of 3)]
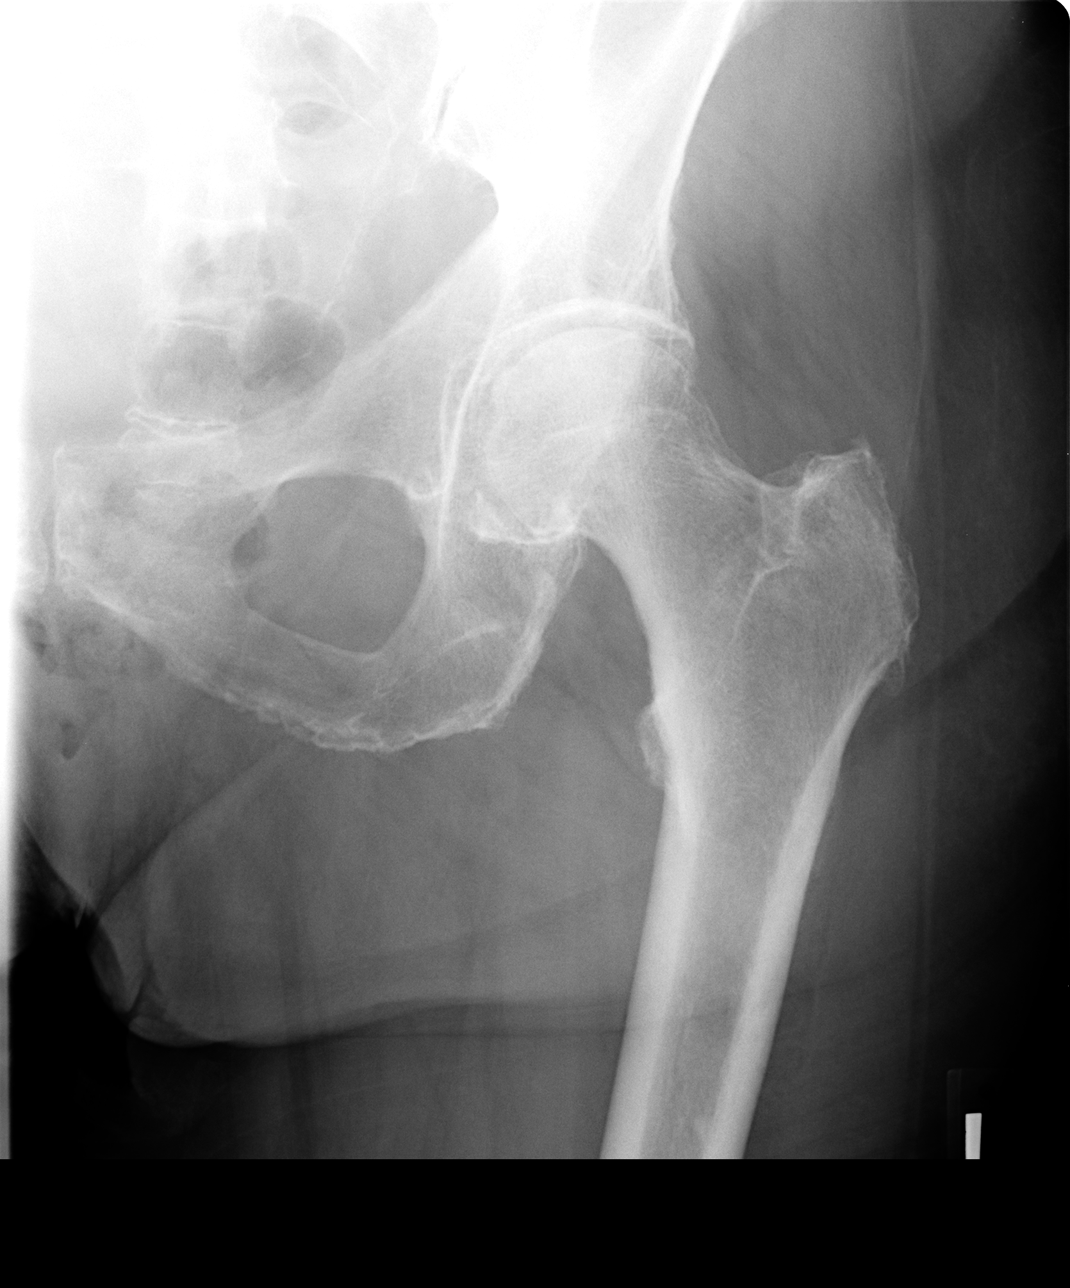

[view not recorded (3 of 3)]
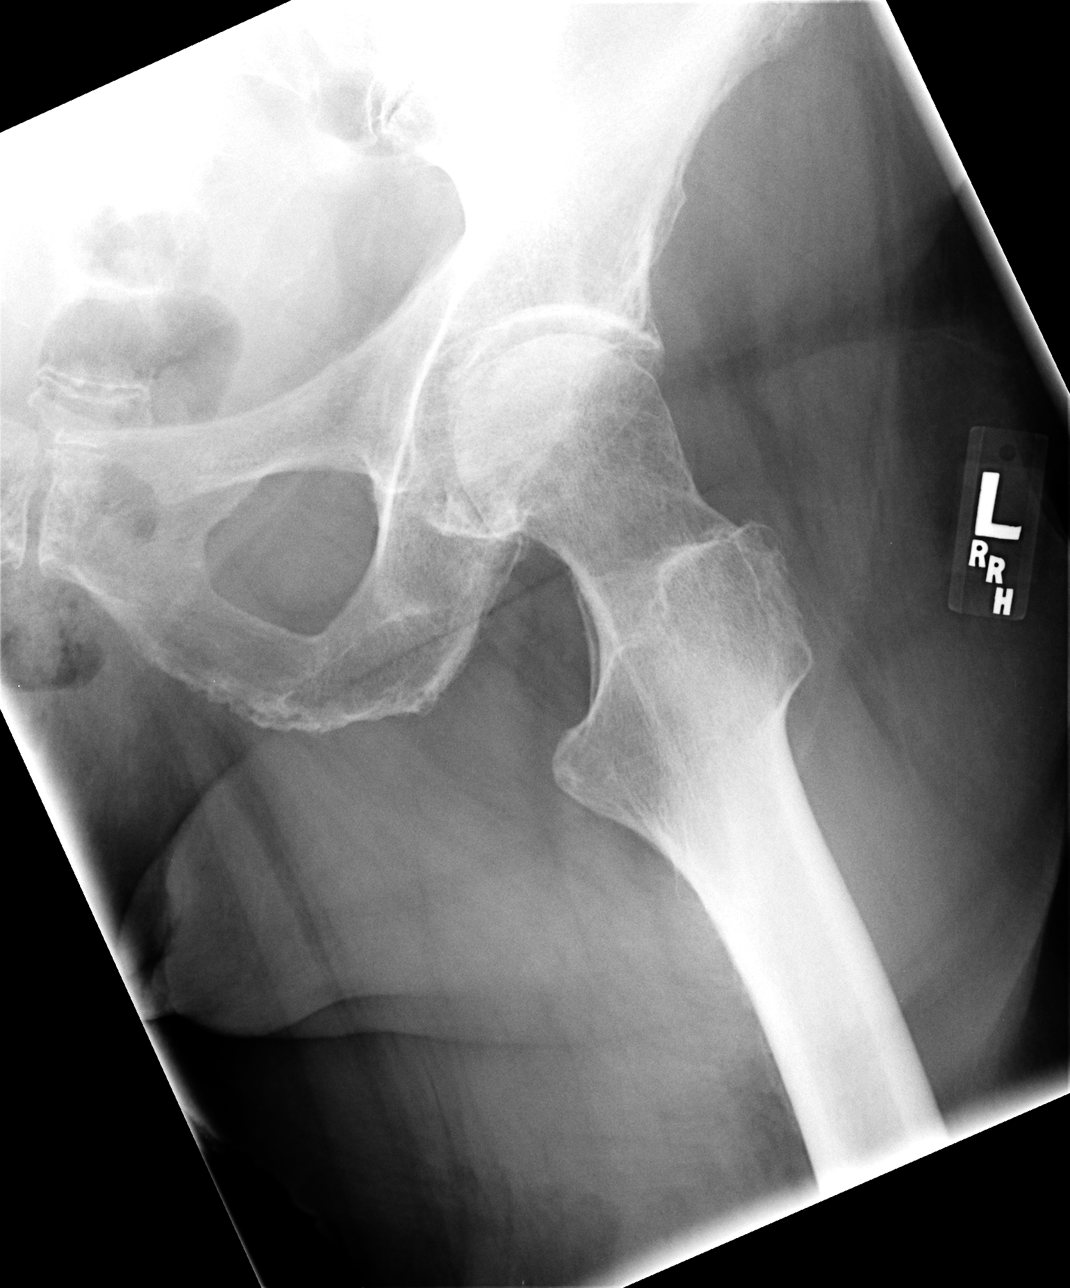

[3 of 3 positions shown; findings below may reference images not displayed]

FINDINGS: There are degenerative changes in the hips right greater
than left with loss of joint space, sclerosis and spurring.
However, no acute fracture is seen.  Some thickening of the cortex
of the superior left femoral neck is noted.  The pelvic rami are
intact.  The SI joints appear normal.  There are degenerative
changes present in the lower lumbar spine.
IMPRESSION: Degenerative changes in the hips.  No acute fracture.

## 2012-10-03 LAB — POCT INR: INR: 2.6

## 2012-10-04 ENCOUNTER — Ambulatory Visit (INDEPENDENT_AMBULATORY_CARE_PROVIDER_SITE_OTHER): Payer: Self-pay | Admitting: Pharmacist Clinician (PhC)/ Clinical Pharmacy Specialist

## 2012-10-04 DIAGNOSIS — I480 Paroxysmal atrial fibrillation: Secondary | ICD-10-CM

## 2012-10-04 DIAGNOSIS — I4891 Unspecified atrial fibrillation: Secondary | ICD-10-CM

## 2012-10-04 DIAGNOSIS — Z7901 Long term (current) use of anticoagulants: Secondary | ICD-10-CM

## 2012-10-09 ENCOUNTER — Other Ambulatory Visit: Payer: Self-pay | Admitting: Family Medicine

## 2012-10-10 LAB — POCT INR: INR: 3.2

## 2012-10-11 ENCOUNTER — Telehealth: Payer: Self-pay | Admitting: Cardiovascular Disease

## 2012-10-11 ENCOUNTER — Ambulatory Visit (INDEPENDENT_AMBULATORY_CARE_PROVIDER_SITE_OTHER): Payer: Medicare Other | Admitting: Pharmacist Clinician (PhC)/ Clinical Pharmacy Specialist

## 2012-10-11 ENCOUNTER — Other Ambulatory Visit: Payer: Self-pay | Admitting: Family Medicine

## 2012-10-11 DIAGNOSIS — I480 Paroxysmal atrial fibrillation: Secondary | ICD-10-CM

## 2012-10-11 DIAGNOSIS — Z7901 Long term (current) use of anticoagulants: Secondary | ICD-10-CM

## 2012-10-11 DIAGNOSIS — I4891 Unspecified atrial fibrillation: Secondary | ICD-10-CM

## 2012-10-11 NOTE — Telephone Encounter (Signed)
INR 3.2 yesterday  Message forwarded to K. Alvstad, PharmD.

## 2012-10-11 NOTE — Telephone Encounter (Signed)
See anti-coag visit, no changes, son to monitor

## 2012-10-17 ENCOUNTER — Other Ambulatory Visit: Payer: Self-pay | Admitting: Family Medicine

## 2012-10-17 LAB — POCT INR: INR: 3.7

## 2012-10-17 NOTE — Telephone Encounter (Signed)
Okay to refill? 

## 2012-10-17 NOTE — Telephone Encounter (Signed)
?   OK to Refill has not been seen here

## 2012-10-18 ENCOUNTER — Telehealth: Payer: Self-pay | Admitting: Cardiovascular Disease

## 2012-10-18 ENCOUNTER — Ambulatory Visit (INDEPENDENT_AMBULATORY_CARE_PROVIDER_SITE_OTHER): Payer: Medicare Other | Admitting: Pharmacist Clinician (PhC)/ Clinical Pharmacy Specialist

## 2012-10-18 DIAGNOSIS — I4891 Unspecified atrial fibrillation: Secondary | ICD-10-CM

## 2012-10-18 DIAGNOSIS — Z7901 Long term (current) use of anticoagulants: Secondary | ICD-10-CM

## 2012-10-18 DIAGNOSIS — I480 Paroxysmal atrial fibrillation: Secondary | ICD-10-CM

## 2012-10-18 NOTE — Telephone Encounter (Signed)
Meds refilled.

## 2012-10-18 NOTE — Telephone Encounter (Signed)
RN missed call.  Message forwarded to K. Alvstad, PharmD.

## 2012-10-25 ENCOUNTER — Ambulatory Visit (INDEPENDENT_AMBULATORY_CARE_PROVIDER_SITE_OTHER): Payer: Medicare Other | Admitting: Pharmacist Clinician (PhC)/ Clinical Pharmacy Specialist

## 2012-10-25 DIAGNOSIS — I4891 Unspecified atrial fibrillation: Secondary | ICD-10-CM

## 2012-10-25 DIAGNOSIS — I480 Paroxysmal atrial fibrillation: Secondary | ICD-10-CM

## 2012-10-25 DIAGNOSIS — Z7901 Long term (current) use of anticoagulants: Secondary | ICD-10-CM

## 2012-10-31 ENCOUNTER — Ambulatory Visit (INDEPENDENT_AMBULATORY_CARE_PROVIDER_SITE_OTHER): Payer: Medicare Other | Admitting: Pharmacist Clinician (PhC)/ Clinical Pharmacy Specialist

## 2012-10-31 DIAGNOSIS — I4891 Unspecified atrial fibrillation: Secondary | ICD-10-CM

## 2012-10-31 DIAGNOSIS — Z7901 Long term (current) use of anticoagulants: Secondary | ICD-10-CM

## 2012-10-31 DIAGNOSIS — I480 Paroxysmal atrial fibrillation: Secondary | ICD-10-CM

## 2012-10-31 LAB — POCT INR: INR: 2.9

## 2012-11-07 LAB — POCT INR: INR: 2.8

## 2012-11-09 ENCOUNTER — Encounter: Payer: Self-pay | Admitting: Family Medicine

## 2012-11-09 ENCOUNTER — Other Ambulatory Visit: Payer: Self-pay | Admitting: Family Medicine

## 2012-11-09 ENCOUNTER — Ambulatory Visit (INDEPENDENT_AMBULATORY_CARE_PROVIDER_SITE_OTHER): Payer: Medicare Other | Admitting: Pharmacist Clinician (PhC)/ Clinical Pharmacy Specialist

## 2012-11-09 DIAGNOSIS — I480 Paroxysmal atrial fibrillation: Secondary | ICD-10-CM

## 2012-11-09 DIAGNOSIS — I4891 Unspecified atrial fibrillation: Secondary | ICD-10-CM

## 2012-11-09 DIAGNOSIS — Z7901 Long term (current) use of anticoagulants: Secondary | ICD-10-CM

## 2012-11-09 NOTE — Telephone Encounter (Signed)
Medication refill for one time only.  Patient needs to be seen.  Letter sent for patient to call and schedule 

## 2012-11-10 IMAGING — CR DG KNEE COMPLETE 4+V*R*
4 series · 4 of 4 positions shown · non-contrast
Comparison: None.

CLINICAL DATA: Bilateral knee pain

RIGHT KNEE - COMPLETE 4+ VIEW

[view not recorded (1 of 4)]
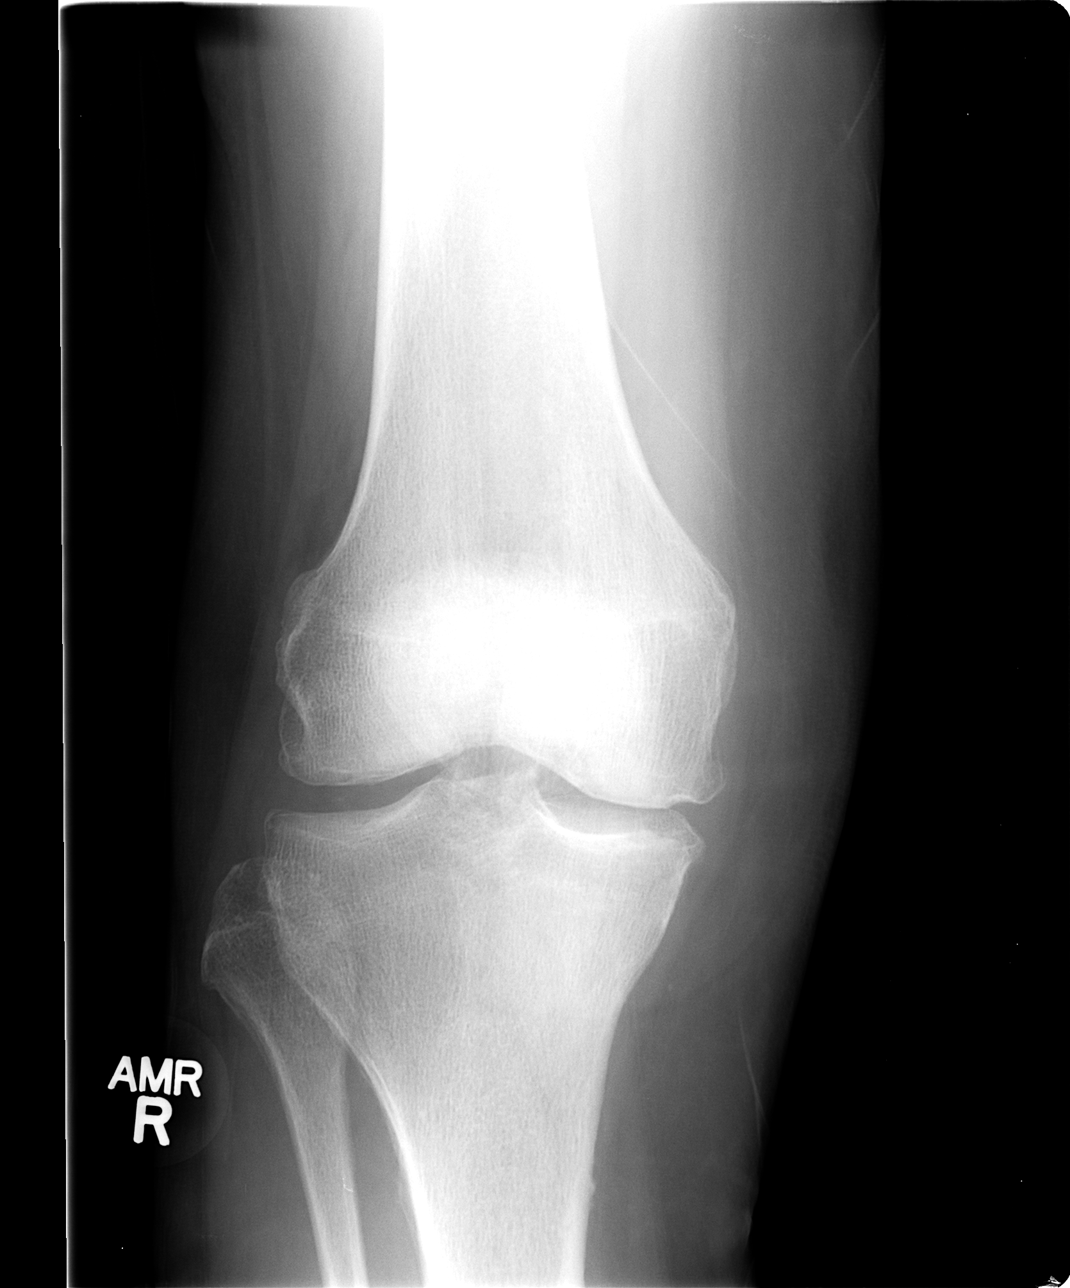

[view not recorded (2 of 4)]
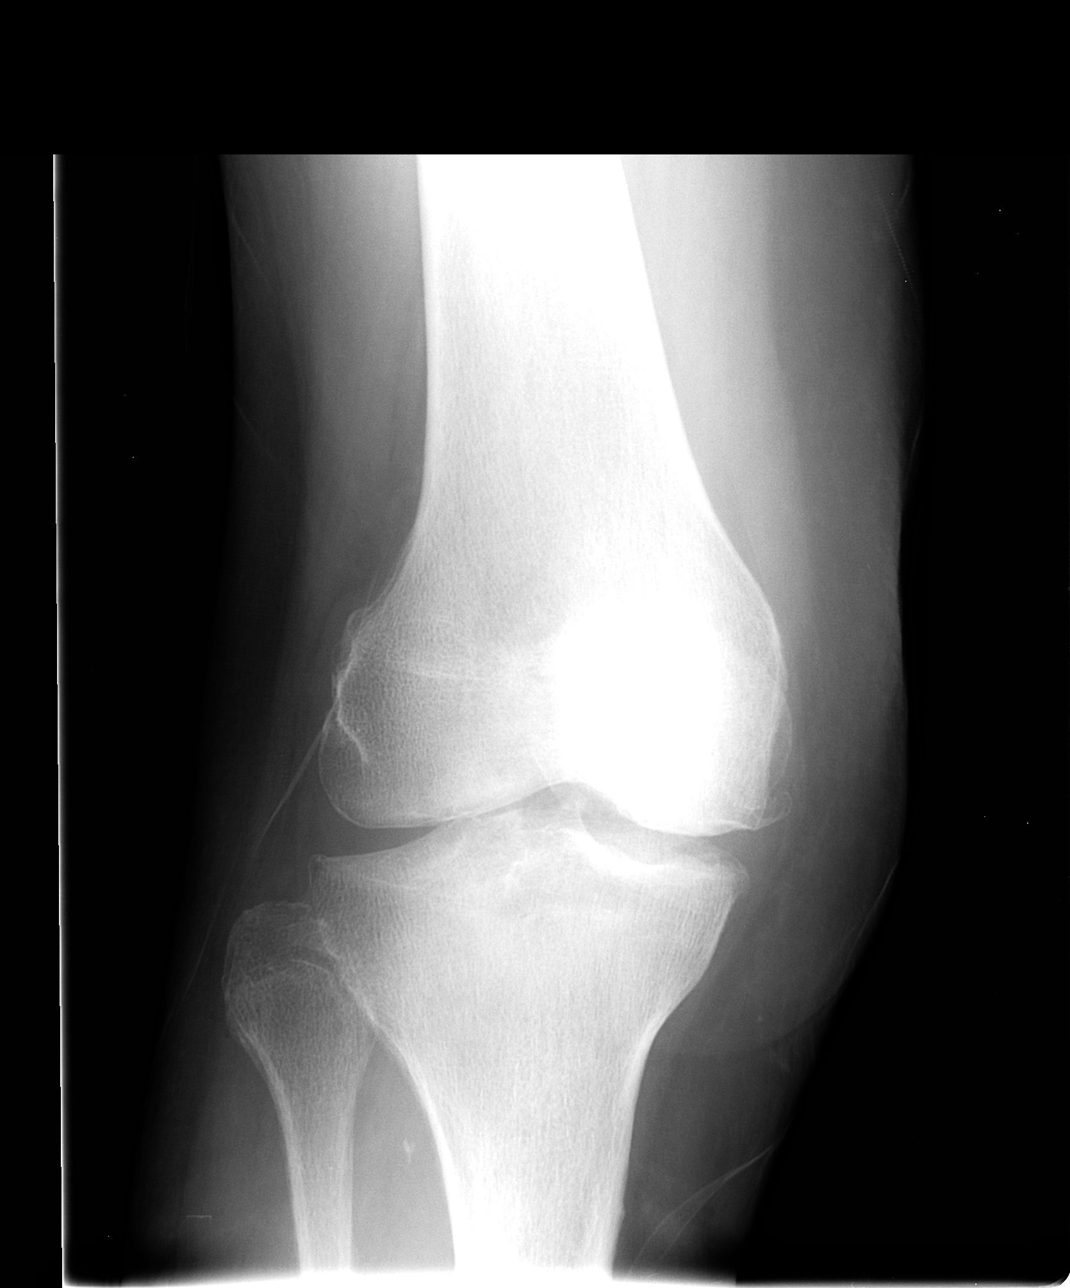

[view not recorded (3 of 4)]
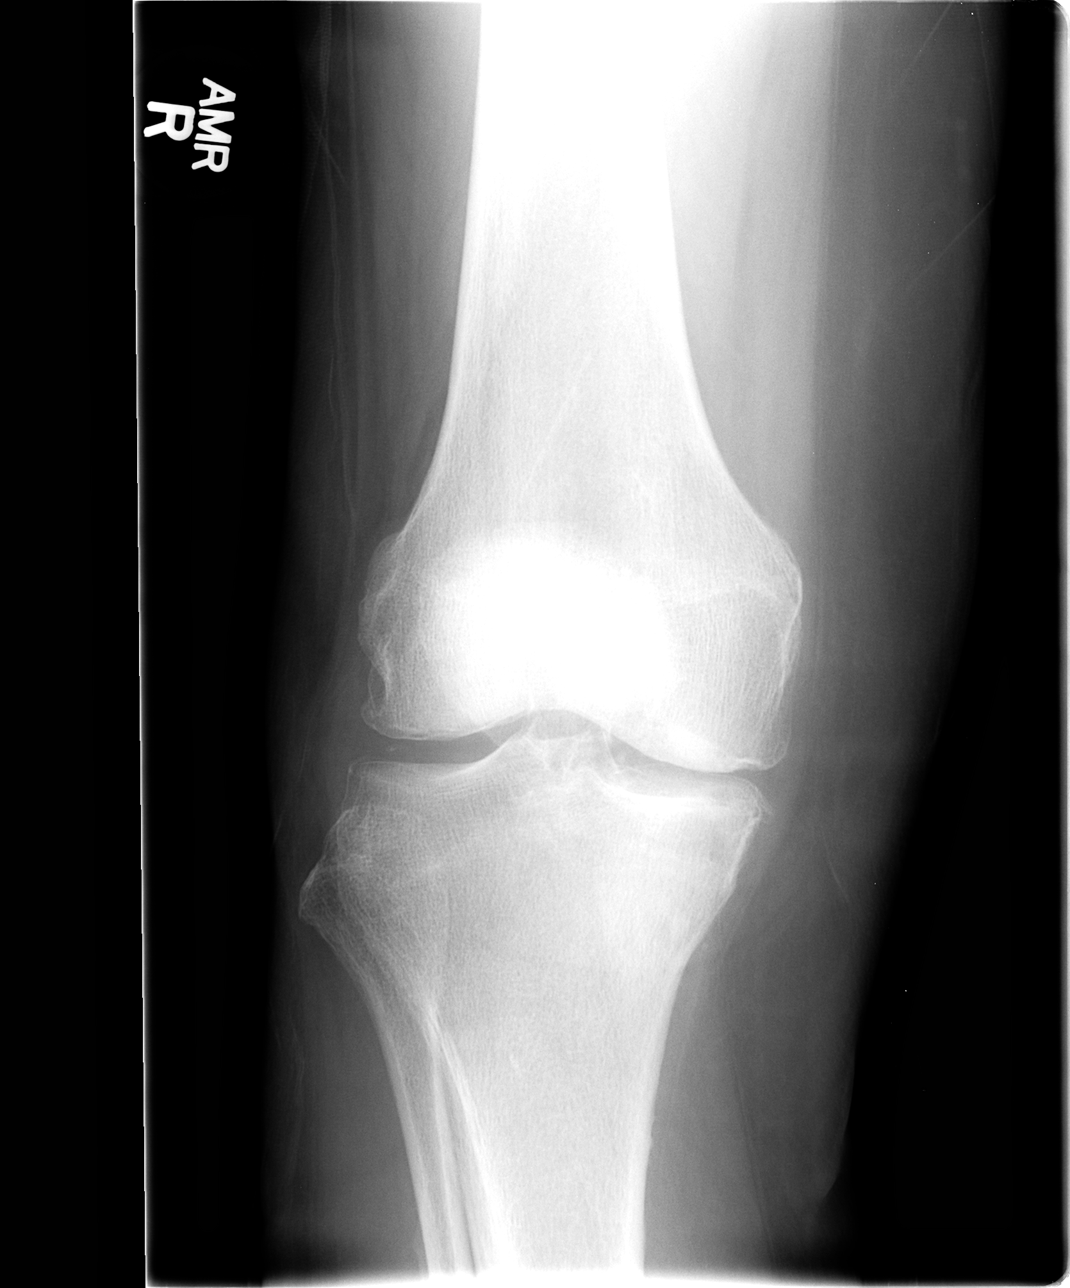

[view not recorded (4 of 4)]
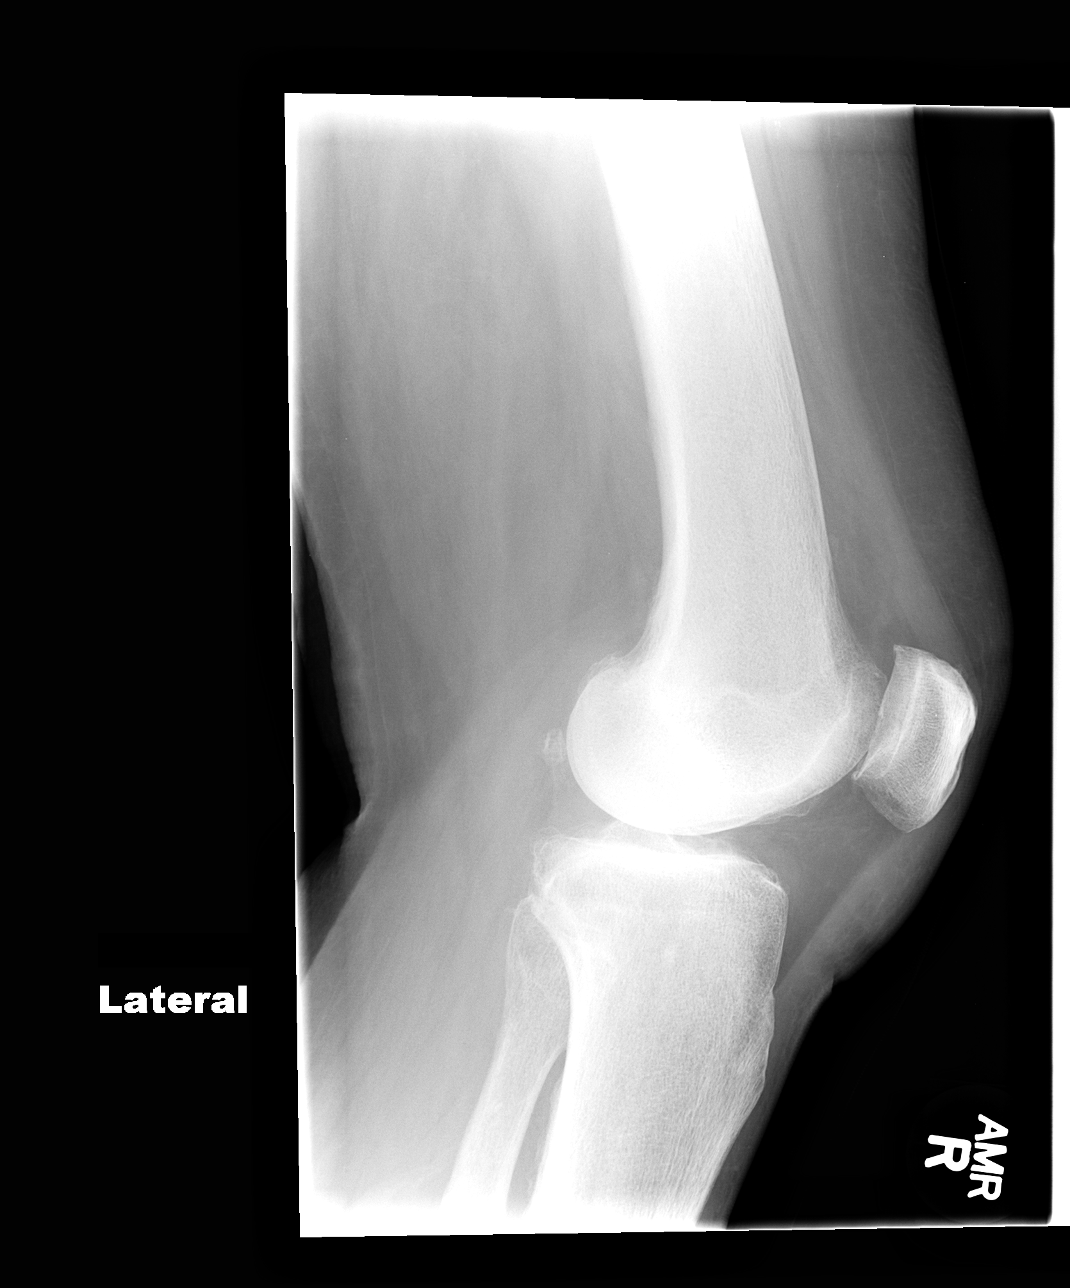

[4 of 4 positions shown; findings below may reference images not displayed]

FINDINGS: Moderate tricompartment osteoarthritic change.  No acute
fracture and no dislocation.
IMPRESSION: Degenerative change.  No acute bony pathology.

## 2012-11-12 ENCOUNTER — Encounter (HOSPITAL_COMMUNITY): Payer: Self-pay | Admitting: *Deleted

## 2012-11-12 ENCOUNTER — Emergency Department (HOSPITAL_COMMUNITY): Payer: Medicare Other

## 2012-11-12 ENCOUNTER — Emergency Department (HOSPITAL_COMMUNITY)
Admission: EM | Admit: 2012-11-12 | Discharge: 2012-11-12 | Disposition: A | Discharge status: Home / Self Care | Payer: Medicare Other | Attending: Emergency Medicine | Admitting: Emergency Medicine

## 2012-11-12 DIAGNOSIS — Z7901 Long term (current) use of anticoagulants: Secondary | ICD-10-CM | POA: Insufficient documentation

## 2012-11-12 DIAGNOSIS — I2510 Atherosclerotic heart disease of native coronary artery without angina pectoris: Secondary | ICD-10-CM | POA: Insufficient documentation

## 2012-11-12 DIAGNOSIS — F3289 Other specified depressive episodes: Secondary | ICD-10-CM | POA: Insufficient documentation

## 2012-11-12 DIAGNOSIS — I10 Essential (primary) hypertension: Secondary | ICD-10-CM | POA: Insufficient documentation

## 2012-11-12 DIAGNOSIS — R05 Cough: Secondary | ICD-10-CM | POA: Insufficient documentation

## 2012-11-12 DIAGNOSIS — R42 Dizziness and giddiness: Secondary | ICD-10-CM

## 2012-11-12 DIAGNOSIS — M129 Arthropathy, unspecified: Secondary | ICD-10-CM | POA: Insufficient documentation

## 2012-11-12 DIAGNOSIS — R5381 Other malaise: Secondary | ICD-10-CM | POA: Insufficient documentation

## 2012-11-12 DIAGNOSIS — D649 Anemia, unspecified: Secondary | ICD-10-CM | POA: Insufficient documentation

## 2012-11-12 DIAGNOSIS — G43909 Migraine, unspecified, not intractable, without status migrainosus: Secondary | ICD-10-CM | POA: Insufficient documentation

## 2012-11-12 DIAGNOSIS — E119 Type 2 diabetes mellitus without complications: Secondary | ICD-10-CM | POA: Insufficient documentation

## 2012-11-12 DIAGNOSIS — R110 Nausea: Secondary | ICD-10-CM | POA: Insufficient documentation

## 2012-11-12 DIAGNOSIS — F329 Major depressive disorder, single episode, unspecified: Secondary | ICD-10-CM | POA: Insufficient documentation

## 2012-11-12 DIAGNOSIS — Z8673 Personal history of transient ischemic attack (TIA), and cerebral infarction without residual deficits: Secondary | ICD-10-CM | POA: Insufficient documentation

## 2012-11-12 DIAGNOSIS — E785 Hyperlipidemia, unspecified: Secondary | ICD-10-CM | POA: Insufficient documentation

## 2012-11-12 DIAGNOSIS — I4891 Unspecified atrial fibrillation: Secondary | ICD-10-CM | POA: Insufficient documentation

## 2012-11-12 DIAGNOSIS — R059 Cough, unspecified: Secondary | ICD-10-CM | POA: Insufficient documentation

## 2012-11-12 DIAGNOSIS — Z79899 Other long term (current) drug therapy: Secondary | ICD-10-CM | POA: Insufficient documentation

## 2012-11-12 DIAGNOSIS — G40909 Epilepsy, unspecified, not intractable, without status epilepticus: Secondary | ICD-10-CM | POA: Insufficient documentation

## 2012-11-12 LAB — COMPREHENSIVE METABOLIC PANEL
AST: 26 U/L (ref 0–37)
Albumin: 3.9 g/dL (ref 3.5–5.2)
Alkaline Phosphatase: 86 U/L (ref 39–117)
BUN: 13 mg/dL (ref 6–23)
CO2: 23 mEq/L (ref 19–32)
Chloride: 98 mEq/L (ref 96–112)
GFR calc Af Amer: 64 mL/min — ABNORMAL LOW (ref >90)
GFR calc non Af Amer: 55 mL/min — ABNORMAL LOW (ref >90)
Potassium: 3.9 mEq/L (ref 3.5–5.1)
Total Bilirubin: 0.4 mg/dL (ref 0.3–1.2)

## 2012-11-12 LAB — URINALYSIS, ROUTINE W REFLEX MICROSCOPIC
Glucose, UA: NEGATIVE mg/dL
Leukocytes, UA: NEGATIVE
Nitrite: NEGATIVE
Protein, ur: NEGATIVE mg/dL
pH: 6 (ref 5.0–8.0)

## 2012-11-12 LAB — URINE MICROSCOPIC-ADD ON

## 2012-11-12 LAB — CBC WITH DIFFERENTIAL/PLATELET
Basophils Absolute: 0 10*3/uL (ref 0.0–0.1)
Basophils Relative: 0 % (ref 0–1)
HCT: 38.8 % (ref 36.0–46.0)
Hemoglobin: 13.6 g/dL (ref 12.0–15.0)
Lymphocytes Relative: 24 % (ref 12–46)
Lymphs Abs: 1.4 10*3/uL (ref 0.7–4.0)
MCHC: 35.1 g/dL (ref 30.0–36.0)
Monocytes Absolute: 0.3 10*3/uL (ref 0.1–1.0)
Monocytes Relative: 5 % (ref 3–12)
Neutro Abs: 4.1 10*3/uL (ref 1.7–7.7)
Neutrophils Relative %: 71 % (ref 43–77)
Platelets: 188 10*3/uL (ref 150–400)
RDW: 13.6 % (ref 11.5–15.5)
WBC: 5.8 10*3/uL (ref 4.0–10.5)

## 2012-11-12 LAB — APTT: aPTT: 36 seconds (ref 24–37)

## 2012-11-12 MED ORDER — ONDANSETRON HCL 4 MG/2ML IJ SOLN
4.0000 mg | Freq: Once | INTRAMUSCULAR | Status: AC
  Administered 2012-11-12: 4 mg via INTRAVENOUS
  Filled 2012-11-12: qty 2

## 2012-11-12 MED ORDER — ONDANSETRON HCL 4 MG PO TABS: 4.0000 mg | ORAL_TABLET | Freq: Four times a day (QID) | ORAL | Status: DC | PRN

## 2012-11-12 MED ORDER — MECLIZINE HCL 12.5 MG PO TABS
25.0000 mg | ORAL_TABLET | Freq: Once | ORAL | Status: AC
  Administered 2012-11-12: 25 mg via ORAL
  Filled 2012-11-12: qty 2

## 2012-11-12 MED ORDER — MECLIZINE HCL 25 MG PO TABS: ORAL_TABLET | ORAL | Status: DC

## 2012-11-12 NOTE — ED Provider Notes (Signed)
CSN: [id number]     Arrival date & time 11/12/12  0720 History  This chart was scribed for Ward Givens, MD by Quintella Reichert, ED scribe.  This patient was seen in room APA02/APA02 and the patient's care was started at 7:54 AM.  Chief Complaint  Patient presents with  . Light-headedness  . Nausea    The history is provided by the patient and a relative. No language interpreter was used.    HPI Comments: Helen [name] is a 79 y.o. female with h/o stroke, A-fib, DM, HTN and hyperlipidemia who presents to the Emergency Department complaining of positional weakness and lightheadedness that have been ongoing since yesterday morning at 2-3 AM when she got up to use the restroom.  Pt states that when she stands up or sits up she feels off-balance as if she is going to fall, and also feels nauseated.  She states she feels fine at rest.  She denies a spinning sensation, CP, SOB, or abdominal pain.  Pt also notes that she had a headache yesterday although this has since resolved.  She localizes headache to the frontal region and states it was moderate in severity.  Family notes pt has similar headaches occasionally and has h/o migraines.  They do state that she was having headaches before her previous stroke.  However this previous stroke was also accompanied by left-sided weakness, slurred speech and facial droop.  Family states that these symptoms have resolved since her previous stroke and they have not noted any recent recurrence.  Son also notes that pt chokes easily when she eats.  He denies fever.  Pt also states she has been coughing occasionally although this is normal for her.  Pt uses a wheelchair at baseline due to chronic generalized weakness and rarely ambulates.  She does not smoke.    PCP is Dr. Jeanice Lim   Past Medical History  Diagnosis Date  . Stroke 02/2011    large right frontal lobe hemispheric stroke  . Atrial fibrillation   . Diabetes mellitus   . Hypertension   . Coronary  artery disease   . Hyperlipidemia 05/25/2011  . Anemia 05/25/2011  . Seizures     Noted at [facility] in Jan 2012, started on Keppra  . Depression   . Arthritis   . TIA (transient ischemic attack) 05/24/2011    2D Echo EF 55%-60%    Past Surgical History  Procedure Laterality Date  . Appendectomy    . Abdominal hysterectomy    . Back surgery  1990s  . Colonoscopy  01/29/2004    [facility]:  rectal polyp-cannot remember?path  . Colonoscopy N/A 05/03/2012    RMR: multiple colonic polyps, s/p piecemeal polypectomy, diverticulosis, fragments of tubular adenomas    Family History  Problem Relation Age of Onset  . Heart disease Father     History  Substance Use Topics  . Smoking status: Never Smoker   . Smokeless tobacco: Not on file  . Alcohol Use: No  lives at home Lives with son Uses wheelchair, rarely walker  OB History   Grav Para Term Preterm Abortions TAB SAB Ect Mult Living                  Review of Systems  Constitutional: Negative for fever.  Respiratory: Positive for cough (mild, chronic). Negative for shortness of breath.   Cardiovascular: Negative for chest pain.  Gastrointestinal: Positive for nausea. Negative for abdominal pain.  Neurological: Positive for weakness, light-headedness and headaches.  All other systems reviewed and are negative.     Allergies  Ciprofloxacin; Niacin and related; and Statins  Home Medications   Current Outpatient Rx  Name  Route  Sig  Dispense  Refill  . acetaminophen (TYLENOL) 500 MG tablet   Oral   Take 500 mg by mouth 2 (two) times daily as needed for pain.         Marland Kitchen atorvastatin (LIPITOR) 10 MG tablet   Oral   Take 10 mg by mouth every morning.          Marland Kitchen atorvastatin (LIPITOR) 10 MG tablet      TAKE 1 TABLET BY MOUTH EVERY DAY AT 6 PM   30 tablet   3   . diphenoxylate-atropine (LOMOTIL) 2.5-0.025 MG per tablet   Oral   Take 1 tablet by mouth 4 (four) times daily as needed for diarrhea or loose  stools.         . donepezil (ARICEPT) 10 MG tablet   Oral   Take 10 mg by mouth every morning.          . donepezil (ARICEPT) 10 MG tablet      TAKE 1 TABLET BY MOUTH EVERY DAY   30 tablet   0   . escitalopram (LEXAPRO) 5 MG tablet   Oral   Take 5 mg by mouth daily.         Marland Kitchen escitalopram (LEXAPRO) 5 MG tablet      TAKE 1 TABLET BY MOUTH EVERY MORNING   30 tablet   1   . fesoterodine (TOVIAZ) 8 MG TB24   Oral   Take 8 mg by mouth at bedtime.          . folic acid (FOLVITE) 1 MG tablet   Oral   Take 1 mg by mouth every morning. At 700         . glipiZIDE (GLUCOTROL) 5 MG tablet   Oral   Take 5 mg by mouth every morning.          Marland Kitchen glipiZIDE (GLUCOTROL) 5 MG tablet      TAKE 1 TABLET BY MOUTH EVERY DAY   30 tablet   3   . ibuprofen (ADVIL,MOTRIN) 200 MG tablet   Oral   Take 200 mg by mouth every 6 (six) hours as needed for pain.         Marland Kitchen KLOR-CON 10 10 MEQ tablet      TAKE 2 TABLETS BY MOUTH EVERY DAY   60 tablet   1   . levETIRAcetam (KEPPRA) 500 MG tablet   Oral   Take 500 mg by mouth 2 (two) times daily.         Marland Kitchen levETIRAcetam (KEPPRA) 500 MG tablet      TAKE 1 TABLET BY MOUTH TWICE DAILY   60 tablet   1   . lisinopril (PRINIVIL,ZESTRIL) 10 MG tablet   Oral   Take 10 mg by mouth every morning.          Marland Kitchen lisinopril (PRINIVIL,ZESTRIL) 10 MG tablet      TAKE 1 TABLET BY MOUTH EVERY DAY   30 tablet   0     Patient needs to be seen before any further refill ...   . metoprolol tartrate (LOPRESSOR) 25 MG tablet   Oral   Take 0.5 tablets (12.5 mg total) by mouth 2 (two) times daily.   60 tablet   3   . metoprolol tartrate (LOPRESSOR) 25  MG tablet      TAKE 1/2 TABLET BY MOUTH TWICE DAILY   30 tablet   2   . mirabegron ER (MYRBETRIQ) 50 MG TB24   Oral   Take 50 mg by mouth at bedtime.          . Multiple Vitamins-Minerals (CENTRUM SILVER ULTRA WOMENS PO)   Oral   Take 1 tablet by mouth every morning. At 700          . potassium chloride (K-DUR) 10 MEQ tablet   Oral   Take 10 mEq by mouth 2 (two) times daily.         . protective barrier (RESTORE) CREA   Topical   Apply 1 application topically daily.         Marland Kitchen sulfamethoxazole-trimethoprim (BACTRIM DS) 800-160 MG per tablet   Oral   Take 1 tablet by mouth every morning.         . warfarin (COUMADIN) 4 MG tablet      TAKE 2 TABLETS BY MOUTH EVERY DAY AS DIRECTED   60 tablet   4   . zolpidem (AMBIEN) 5 MG tablet   Oral   Take 5 mg by mouth at bedtime as needed for sleep.          BP 163/104  Pulse 76  Temp(Src) 98.3 F (36.8 C) (Oral)  Resp 18  Ht 5\' 5"  (1.651 m)  Wt 185 lb (83.915 kg)  BMI 30.79 kg/m2  SpO2 100%  Vital signs normal except hypertension   Physical Exam  Nursing note and vitals reviewed. Constitutional: She is oriented to person, place, and time. She appears well-developed and well-nourished.  Non-toxic appearance. She does not appear ill. No distress.  HENT:  Head: Normocephalic and atraumatic.  Right Ear: External ear normal.  Left Ear: External ear normal.  Nose: Nose normal. No mucosal edema or rhinorrhea.  Mouth/Throat: Oropharynx is clear and moist and mucous membranes are normal. No dental abscesses or edematous.  Eyes: Conjunctivae and EOM are normal. Pupils are equal, round, and reactive to light.  No nystagmus  Neck: Normal range of motion and full passive range of motion without pain. Neck supple.  Cardiovascular: Normal rate, regular rhythm and normal heart sounds.  Exam reveals no gallop and no friction rub.   No murmur heard. Pulmonary/Chest: Effort normal and breath sounds normal. No respiratory distress. She has no wheezes. She has no rhonchi. She has no rales. She exhibits no tenderness and no crepitus.  Abdominal: Soft. Normal appearance and bowel sounds are normal. She exhibits no distension. There is no tenderness. There is no rebound and no guarding.  Musculoskeletal: Normal range  of motion. She exhibits no edema and no tenderness.  Moves all extremities well.   Neurological: She is alert and oriented to person, place, and time. She has normal strength. No cranial nerve deficit.  Equal grip strength bilaterally Heel-to-shin normal bilaterally Normal finger-to-nose bilaterally  Skin: Skin is warm, dry and intact. No rash noted. No erythema. No pallor.  Psychiatric: She has a normal mood and affect. Her speech is normal and behavior is normal. Her mood appears not anxious.    ED Course  Procedures (including critical care time)  Medications  meclizine (ANTIVERT) tablet 25 mg (25 mg Oral Given 11/12/12 0820)  ondansetron (ZOFRAN) injection 4 mg (4 mg Intravenous Given 11/12/12 0820)  meclizine (ANTIVERT) tablet 25 mg (25 mg Oral Given 11/12/12 1021)    DIAGNOSTIC STUDIES: Oxygen Saturation is 100% on  room air, normal by my interpretation.    COORDINATION OF CARE: 8:06 AM-Discussed treatment plan which includes Antivert, anti-emetics, brain MRI, EKG and labs with pt at bedside and pt agreed to plan.   10:12 AM-Informed pt and family that imaging is negative for stroke and labs are unremarkable apart from slightly elevated blood glucose, and that symptoms are most likely due to BPV.  Pt notes that she had vertigo 50 years ago.  Discussed treatment plan which includes Antivert prescription and f/u with PCP if symptoms do not resolve within one week.  Pt and family expressed understanding and agreed with plan.    Labs Review Results for orders placed during the hospital encounter of 11/12/12  URINALYSIS, ROUTINE W REFLEX MICROSCOPIC      Result Value Range   Color, Urine YELLOW  YELLOW   APPearance CLEAR  CLEAR   Specific Gravity, Urine 1.015  1.005 - 1.030   pH 6.0  5.0 - 8.0   Glucose, UA NEGATIVE  NEGATIVE mg/dL   Hgb urine dipstick SMALL (*) NEGATIVE   Bilirubin Urine NEGATIVE  NEGATIVE   Ketones, ur TRACE (*) NEGATIVE mg/dL   Protein, ur NEGATIVE  NEGATIVE  mg/dL   Urobilinogen, UA 0.2  0.0 - 1.0 mg/dL   Nitrite NEGATIVE  NEGATIVE   Leukocytes, UA NEGATIVE  NEGATIVE  CBC WITH DIFFERENTIAL      Result Value Range   WBC 5.8  4.0 - 10.5 K/uL   RBC 4.29  3.87 - 5.11 MIL/uL   Hemoglobin 13.6  12.0 - 15.0 g/dL   HCT 62.1  30.8 - 65.7 %   MCV 90.4  78.0 - 100.0 fL   MCH 31.7  26.0 - 34.0 pg   MCHC 35.1  30.0 - 36.0 g/dL   RDW 84.6  96.2 - 95.2 %   Platelets 188  150 - 400 K/uL   Neutrophils Relative % 71  43 - 77 %   Neutro Abs 4.1  1.7 - 7.7 K/uL   Lymphocytes Relative 24  12 - 46 %   Lymphs Abs 1.4  0.7 - 4.0 K/uL   Monocytes Relative 5  3 - 12 %   Monocytes Absolute 0.3  0.1 - 1.0 K/uL   Eosinophils Relative 1  0 - 5 %   Eosinophils Absolute 0.0  0.0 - 0.7 K/uL   Basophils Relative 0  0 - 1 %   Basophils Absolute 0.0  0.0 - 0.1 K/uL  COMPREHENSIVE METABOLIC PANEL      Result Value Range   Sodium 135  135 - 145 mEq/L   Potassium 3.9  3.5 - 5.1 mEq/L   Chloride 98  96 - 112 mEq/L   CO2 23  19 - 32 mEq/L   Glucose, Bld 218 (*) 70 - 99 mg/dL   BUN 13  6 - 23 mg/dL   Creatinine, Ser 8.41  0.50 - 1.10 mg/dL   Calcium 9.7  8.4 - 32.4 mg/dL   Total Protein 7.6  6.0 - 8.3 g/dL   Albumin 3.9  3.5 - 5.2 g/dL   AST 26  0 - 37 U/L   ALT 24  0 - 35 U/L   Alkaline Phosphatase 86  39 - 117 U/L   Total Bilirubin 0.4  0.3 - 1.2 mg/dL   GFR calc non Af Amer 55 (*) >90 mL/min   GFR calc Af Amer 64 (*) >90 mL/min  APTT      Result Value Range   aPTT  36  24 - 37 seconds  PROTIME-INR      Result Value Range   Prothrombin Time 23.9 (*) 11.6 - 15.2 seconds   INR 2.22 (*) 0.00 - 1.49  TROPONIN I      Result Value Range   Troponin I <0.30  <0.30 ng/mL  URINE MICROSCOPIC-ADD ON      Result Value Range   Squamous Epithelial / LPF RARE  RARE   RBC / HPF 3-6  <3 RBC/hpf   Laboratory interpretation all normal except therapeutic INR, hyperglycemia    Mr Brain Wo Contrast  11/12/2012   CLINICAL DATA:  Dizziness and generalized weakness for the  past 2 days. Diabetic hypertensive patient with hyperlipidemia and atrial fibrillation. History of seizures.  EXAM: MRI HEAD WITHOUT CONTRAST  TECHNIQUE: Multiplanar, multisequence MR imaging was performed. No intravenous contrast was administered.  COMPARISON:  07/13/2012 CT. 05/24/2011 MR.  FINDINGS: Exam is motion degraded.  No acute infarct.  Marked small vessel disease type changes.  Remote right frontal-parietal lobe infarct.  Global atrophy.  No intracranial mass lesion noted on this unenhanced exam.  No intracranial hemorrhage.  Transverse ligament hypertrophy with minimal impression on the ventral aspect of the adjacent cord.  Major intracranial vascular structures are patent.  IMPRESSION: Exam is motion degraded.  No acute infarct.  Marked small vessel disease type changes and remote right frontal-parietal lobe infarct.  Global atrophy.  Transverse ligament hypertrophy with minimal impression on the ventral aspect of the adjacent cord.   Electronically Signed   By: Bridgett Larsson M.D.   On: 11/12/2012 09:24      Date: 11/12/2012  Rate: 58  Rhythm: sinus bradycardia  QRS Axis: left  Intervals: normal  ST/T Wave abnormalities: nonspecific T wave changes  Conduction Disutrbances:none  Narrative Interpretation:   Old EKG Reviewed: unchanged from 07/23/2012    MDM   1. Vertigo    Discharge Medication List as of 11/12/2012 10:16 AM    START taking these medications   Details  meclizine (ANTIVERT) 25 MG tablet Take 1 or 2 po Q 6hrs for dizziness, Print    ondansetron (ZOFRAN) 4 MG tablet Take 1 tablet (4 mg total) by mouth every 6 (six) hours as needed for nausea., Starting 11/12/2012, Until Discontinued, Print        Plan discharge   Devoria Albe, MD, FACEP   I personally performed the services described in this documentation, which was scribed in my presence. The recorded information has been reviewed and considered.  Devoria Albe, MD, Armando Gang    Ward Givens, MD 11/12/12 [phone]

## 2012-11-12 NOTE — ED Notes (Signed)
Pt alert & oriented x4, stable gait. Patient given discharge instructions, paperwork & prescription(s). Patient  instructed to stop at the registration desk to finish any additional paperwork. Patient verbalized understanding. Pt left department w/ no further questions. 

## 2012-11-12 NOTE — ED Notes (Signed)
Pt complains of dizziness when moving, nausea, denies emesis. Symptoms started Saturday, better yesterday but worse today.

## 2012-11-12 NOTE — ED Notes (Signed)
Pt currently vomiting small amount when ambulating to bed during triage.

## 2012-11-14 LAB — POCT INR: INR: 2.4

## 2012-11-15 ENCOUNTER — Ambulatory Visit (INDEPENDENT_AMBULATORY_CARE_PROVIDER_SITE_OTHER): Payer: Medicare Other | Admitting: Pharmacist Clinician (PhC)/ Clinical Pharmacy Specialist

## 2012-11-15 DIAGNOSIS — I4891 Unspecified atrial fibrillation: Secondary | ICD-10-CM

## 2012-11-15 DIAGNOSIS — I480 Paroxysmal atrial fibrillation: Secondary | ICD-10-CM

## 2012-11-15 DIAGNOSIS — Z7901 Long term (current) use of anticoagulants: Secondary | ICD-10-CM

## 2012-11-16 ENCOUNTER — Encounter: Payer: Self-pay | Admitting: Family Medicine

## 2012-11-16 ENCOUNTER — Ambulatory Visit (INDEPENDENT_AMBULATORY_CARE_PROVIDER_SITE_OTHER): Payer: Medicare Other | Admitting: Family Medicine

## 2012-11-16 VITALS — BP 128/76 | HR 60 | Temp 97.6°F | Resp 18

## 2012-11-16 DIAGNOSIS — E119 Type 2 diabetes mellitus without complications: Secondary | ICD-10-CM

## 2012-11-16 DIAGNOSIS — I10 Essential (primary) hypertension: Secondary | ICD-10-CM

## 2012-11-16 DIAGNOSIS — G459 Transient cerebral ischemic attack, unspecified: Secondary | ICD-10-CM

## 2012-11-16 DIAGNOSIS — Z23 Encounter for immunization: Secondary | ICD-10-CM

## 2012-11-16 DIAGNOSIS — W19XXXD Unspecified fall, subsequent encounter: Secondary | ICD-10-CM

## 2012-11-16 DIAGNOSIS — W19XXXA Unspecified fall, initial encounter: Secondary | ICD-10-CM

## 2012-11-16 NOTE — Patient Instructions (Signed)
Take 1/2 of lisinopril of tablet twice a day  With the metoprolol Continue all other medications I will call with results of the bloodwork Flu shot given  Physical therapy F/U 4 months

## 2012-11-17 LAB — BASIC METABOLIC PANEL
Calcium: 9 mg/dL (ref 8.4–10.5)
Creat: 1.34 mg/dL — ABNORMAL HIGH (ref 0.50–1.10)
Glucose, Bld: 216 mg/dL — ABNORMAL HIGH (ref 70–99)
Potassium: 4.7 mEq/L (ref 3.5–5.3)
Sodium: 134 mEq/L — ABNORMAL LOW (ref 135–145)

## 2012-11-17 LAB — HEMOGLOBIN A1C
Hgb A1c MFr Bld: 6.9 % — ABNORMAL HIGH (ref <5.7)
Mean Plasma Glucose: 151 mg/dL — ABNORMAL HIGH (ref <117)

## 2012-11-18 ENCOUNTER — Encounter: Payer: Self-pay | Admitting: Family Medicine

## 2012-11-18 DIAGNOSIS — G459 Transient cerebral ischemic attack, unspecified: Secondary | ICD-10-CM | POA: Insufficient documentation

## 2012-11-18 NOTE — Assessment & Plan Note (Signed)
Check A1C, based on her age goal to keep A1C < 8 without causing hypoglycemia

## 2012-11-18 NOTE — Progress Notes (Signed)
  Subjective:    Patient ID: Helen [name], female    DOB: 10-02-1924, 79 y.o.   MRN: [id number]  HPI  Pt here to f/u ER visit. Was seen secondary to nausea, dizziness, confusion. Concern for CVA, imaging did not show any new infarct. Diagnosis given vertigo sent home with meclizine and zofran which she has been taking on regular basis.Dizziness has improved, appetite is still down. Son and caregiver states the day before and day of hospital she was very confused and speech would slur, it would then clear up but then she would be fatigued and weak.  Here with her son and caregiver who lives with them. They note she has fallen a few times over the past month. She has difficulty transferring out of her chair.  BP at home has been in 160's systolic it fluctuates throughout the day after medications taken CBG have been 140-180's fasting.  She is still on coumadin by cardiology, last INR 2.4    Review of Systems  GEN- denies fatigue, fever, weight loss,weakness, recent illness HEENT- denies eye drainage, change in vision, nasal discharge, CVS- denies chest pain, palpitations RESP- denies SOB, cough, wheeze ABD- denies N/V, change in stools, abd pain GU- denies dysuria, hematuria, dribbling, incontinence MSK- + joint pain, muscle aches, injury Neuro- denies headache, +dizziness, syncope, seizure activity      Objective:   Physical Exam  GEN- NAD, alert and oriented x3, well appearing, sitting in wheelchair HEENT-  PERRL,non injected sclera, pink conjunctiva, MMM, oropharynx clear CVS- RRR, no murmur RESP-CTAB ABD-NABS,soft,NT,ND EXT- No edema Pulses- Radial,2+ Neuro- CNII-XII grossly in tact sensation grossly in tact, decreased strength 4+/5 RUE  4+/5LUE,  4/5 RLE 4+/5 LLE,     Assessment & Plan:

## 2012-11-18 NOTE — Assessment & Plan Note (Signed)
Will have her take 1/2 tablet BID of both lisinopril and her metoprolol to see if this evens things out with her BP

## 2012-11-18 NOTE — Assessment & Plan Note (Signed)
Based on history I think she may have had TIA, no large infarct noted on MRI scan, only old area of infarct seen Continue to keep BP down and sugar as tolerated Okay to stop nausea meds and meclizine and see how she does Check lytes today

## 2012-11-18 NOTE — Assessment & Plan Note (Signed)
Recurrent fall in home, will send Home PT out to assess, needs improved strength to assist with transfers out of her chair

## 2012-11-19 ENCOUNTER — Emergency Department (HOSPITAL_COMMUNITY)
Admission: EM | Admit: 2012-11-19 | Discharge: 2012-11-19 | Disposition: A | Discharge status: Home / Self Care | Payer: Medicare Other | Attending: Emergency Medicine | Admitting: Emergency Medicine

## 2012-11-19 ENCOUNTER — Encounter (HOSPITAL_COMMUNITY): Payer: Self-pay | Admitting: Emergency Medicine

## 2012-11-19 DIAGNOSIS — G40909 Epilepsy, unspecified, not intractable, without status epilepticus: Secondary | ICD-10-CM | POA: Insufficient documentation

## 2012-11-19 DIAGNOSIS — I10 Essential (primary) hypertension: Secondary | ICD-10-CM | POA: Insufficient documentation

## 2012-11-19 DIAGNOSIS — F329 Major depressive disorder, single episode, unspecified: Secondary | ICD-10-CM | POA: Insufficient documentation

## 2012-11-19 DIAGNOSIS — R110 Nausea: Secondary | ICD-10-CM | POA: Insufficient documentation

## 2012-11-19 DIAGNOSIS — F29 Unspecified psychosis not due to a substance or known physiological condition: Secondary | ICD-10-CM | POA: Insufficient documentation

## 2012-11-19 DIAGNOSIS — F3289 Other specified depressive episodes: Secondary | ICD-10-CM | POA: Insufficient documentation

## 2012-11-19 DIAGNOSIS — Z79899 Other long term (current) drug therapy: Secondary | ICD-10-CM | POA: Insufficient documentation

## 2012-11-19 DIAGNOSIS — D649 Anemia, unspecified: Secondary | ICD-10-CM | POA: Insufficient documentation

## 2012-11-19 DIAGNOSIS — R5381 Other malaise: Secondary | ICD-10-CM | POA: Insufficient documentation

## 2012-11-19 DIAGNOSIS — R410 Disorientation, unspecified: Secondary | ICD-10-CM

## 2012-11-19 DIAGNOSIS — E785 Hyperlipidemia, unspecified: Secondary | ICD-10-CM | POA: Insufficient documentation

## 2012-11-19 DIAGNOSIS — Z8739 Personal history of other diseases of the musculoskeletal system and connective tissue: Secondary | ICD-10-CM | POA: Insufficient documentation

## 2012-11-19 DIAGNOSIS — I2510 Atherosclerotic heart disease of native coronary artery without angina pectoris: Secondary | ICD-10-CM | POA: Insufficient documentation

## 2012-11-19 DIAGNOSIS — Z8673 Personal history of transient ischemic attack (TIA), and cerebral infarction without residual deficits: Secondary | ICD-10-CM | POA: Insufficient documentation

## 2012-11-19 DIAGNOSIS — E119 Type 2 diabetes mellitus without complications: Secondary | ICD-10-CM | POA: Insufficient documentation

## 2012-11-19 HISTORY — DX: Dizziness and giddiness: R42

## 2012-11-19 LAB — CBC WITH DIFFERENTIAL/PLATELET
Basophils Relative: 0 % (ref 0–1)
Eosinophils Relative: 1 % (ref 0–5)
HCT: 39 % (ref 36.0–46.0)
Hemoglobin: 13.2 g/dL (ref 12.0–15.0)
Lymphocytes Relative: 34 % (ref 12–46)
MCHC: 33.8 g/dL (ref 30.0–36.0)
Monocytes Absolute: 0.4 10*3/uL (ref 0.1–1.0)
Monocytes Relative: 7 % (ref 3–12)
Neutro Abs: 3.3 10*3/uL (ref 1.7–7.7)
Neutrophils Relative %: 58 % (ref 43–77)
RBC: 4.25 MIL/uL (ref 3.87–5.11)
WBC: 5.7 10*3/uL (ref 4.0–10.5)

## 2012-11-19 LAB — URINALYSIS, ROUTINE W REFLEX MICROSCOPIC
Bilirubin Urine: NEGATIVE
Glucose, UA: NEGATIVE mg/dL
Leukocytes, UA: NEGATIVE
Nitrite: NEGATIVE
Specific Gravity, Urine: 1.02 (ref 1.005–1.030)
Urobilinogen, UA: 0.2 mg/dL (ref 0.0–1.0)
pH: 6 (ref 5.0–8.0)

## 2012-11-19 LAB — BASIC METABOLIC PANEL
BUN: 17 mg/dL (ref 6–23)
CO2: 26 mEq/L (ref 19–32)
Chloride: 101 mEq/L (ref 96–112)
Creatinine, Ser: 0.94 mg/dL (ref 0.50–1.10)
GFR calc Af Amer: 62 mL/min — ABNORMAL LOW (ref >90)
Potassium: 3.9 mEq/L (ref 3.5–5.1)
Sodium: 139 mEq/L (ref 135–145)

## 2012-11-19 LAB — URINE MICROSCOPIC-ADD ON

## 2012-11-19 NOTE — ED Provider Notes (Signed)
CSN: [id number]     Arrival date & time 11/19/12  1253 History   First MD Initiated Contact with Patient 11/19/12 1455     Chief Complaint  Patient presents with  . Altered Mental Status    HPI Comments: A week ago pt was seen in the ED for vertigo.  She was evaluated in released.  For the last week she has noticed trouble with head spinning when ever she moves her head.  That actually has improved but has not completely resolved.  When she sits up in bed her head will spin and she will feel nauseated.  Family has noticed for the last three days she has been confused.  For example, today she started messing with the floor mat and thought there was a pocket book left on the floor.  She has been seeing people in the room that don't exist.  At times she has been agitated too.  Patient is a 79 y.o. female presenting with altered mental status. The history is provided by the patient.  Altered Mental Status Presenting symptoms: confusion   Most recent episode:  More than 2 days ago Episode history:  Multiple Timing:  Intermittent Progression:  Worsening Context: recent change in medication (but she has not taken anything in a couple of days)   Context: not dementia   Associated symptoms: agitation, hallucinations, nausea, slurred speech and weakness   Associated symptoms: no abdominal pain, no fever and no seizures     Past Medical History  Diagnosis Date  . Stroke 02/2011    large right frontal lobe hemispheric stroke  . Atrial fibrillation   . Diabetes mellitus   . Hypertension   . Coronary artery disease   . Hyperlipidemia 05/25/2011  . Anemia 05/25/2011  . Seizures     Noted at [facility] in Jan 2012, started on Keppra  . Depression   . Arthritis   . TIA (transient ischemic attack) 05/24/2011    2D Echo EF 55%-60%  . Vertigo    Past Surgical History  Procedure Laterality Date  . Appendectomy    . Abdominal hysterectomy    . Back surgery  1990s  . Colonoscopy  01/29/2004   [facility]:  rectal polyp-cannot remember?path  . Colonoscopy N/A 05/03/2012    RMR: multiple colonic polyps, s/p piecemeal polypectomy, diverticulosis, fragments of tubular adenomas   Family History  Problem Relation Age of Onset  . Heart disease Father    History  Substance Use Topics  . Smoking status: Never Smoker   . Smokeless tobacco: Not on file  . Alcohol Use: No   OB History   Grav Para Term Preterm Abortions TAB SAB Ect Mult Living                 Review of Systems  Constitutional: Negative for fever.  Gastrointestinal: Positive for nausea. Negative for abdominal pain.  Neurological: Positive for weakness. Negative for seizures.  Psychiatric/Behavioral: Positive for hallucinations, confusion and agitation.  All other systems reviewed and are negative.    Allergies  Ciprofloxacin; Niacin and related; and Statins  Home Medications   Current Outpatient Rx  Name  Route  Sig  Dispense  Refill  . acetaminophen (TYLENOL) 500 MG tablet   Oral   Take 500 mg by mouth 2 (two) times daily as needed for pain.         Marland Kitchen atorvastatin (LIPITOR) 10 MG tablet   Oral   Take 10 mg by mouth every morning.          Marland Kitchen  donepezil (ARICEPT) 10 MG tablet   Oral   Take 10 mg by mouth every morning.          . escitalopram (LEXAPRO) 5 MG tablet   Oral   Take 5 mg by mouth daily.         . fesoterodine (TOVIAZ) 8 MG TB24   Oral   Take 8 mg by mouth at bedtime.          . folic acid (FOLVITE) 1 MG tablet   Oral   Take 1 mg by mouth every morning. At 700         . glipiZIDE (GLUCOTROL) 5 MG tablet   Oral   Take 5 mg by mouth every morning.          Marland Kitchen ibuprofen (ADVIL,MOTRIN) 200 MG tablet   Oral   Take 200 mg by mouth every 6 (six) hours as needed for pain.         Marland Kitchen levETIRAcetam (KEPPRA) 500 MG tablet   Oral   Take 500 mg by mouth 2 (two) times daily.         Marland Kitchen lisinopril (PRINIVIL,ZESTRIL) 10 MG tablet   Oral   Take 5 mg by mouth 2 (two) times  daily.          . meclizine (ANTIVERT) 25 MG tablet      Take 1 or 2 po Q 6hrs for dizziness   60 tablet   0   . metoprolol tartrate (LOPRESSOR) 25 MG tablet   Oral   Take 0.5 tablets (12.5 mg total) by mouth 2 (two) times daily.   60 tablet   3   . mirabegron ER (MYRBETRIQ) 50 MG TB24   Oral   Take 50 mg by mouth at bedtime.          . Multiple Vitamins-Minerals (CENTRUM SILVER ULTRA WOMENS PO)   Oral   Take 1 tablet by mouth every morning. At 700         . ondansetron (ZOFRAN) 4 MG tablet   Oral   Take 1 tablet (4 mg total) by mouth every 6 (six) hours as needed for nausea.   20 tablet   0   . potassium chloride (K-DUR) 10 MEQ tablet   Oral   Take 20 mEq by mouth daily.          . protective barrier (RESTORE) CREA   Topical   Apply 1 application topically 3 (three) times a week.          . sulfamethoxazole-trimethoprim (BACTRIM DS) 800-160 MG per tablet   Oral   Take 1 tablet by mouth every morning.         . warfarin (COUMADIN) 4 MG tablet   Oral   Take 4-8 mg by mouth See admin instructions. Takes 4mg  on Monday, Wednesday and Friday.  And 8mg  all other days.         Marland Kitchen zolpidem (AMBIEN) 5 MG tablet   Oral   Take 5 mg by mouth at bedtime as needed for sleep.          BP 163/69  Pulse 64  Temp(Src) 97.5 F (36.4 C) (Oral)  Resp 16  Ht 5\' 5"  (1.651 m)  Wt 185 lb (83.915 kg)  BMI 30.79 kg/m2  SpO2 99% Physical Exam  Nursing note and vitals reviewed. Constitutional: She is oriented to person, place, and time. She appears well-developed and well-nourished. No distress.  HENT:  Head: Normocephalic and atraumatic.  Right Ear: External ear normal.  Left Ear: External ear normal.  Mouth/Throat: Oropharynx is clear and moist.  Eyes: Conjunctivae are normal. Right eye exhibits no discharge. Left eye exhibits no discharge. No scleral icterus.  Neck: Normal range of motion. Neck supple. No tracheal deviation present.  Cardiovascular: Normal  rate, regular rhythm and intact distal pulses.   Pulmonary/Chest: Effort normal and breath sounds normal. No stridor. No respiratory distress. She has no wheezes. She has no rales.  Abdominal: Soft. Bowel sounds are normal. She exhibits no distension. There is no tenderness. There is no rebound and no guarding.  Musculoskeletal: She exhibits no edema and no tenderness.  Neurological: She is alert and oriented to person, place, and time. She has normal strength. No cranial nerve deficit ( no gross defecits noted) or sensory deficit. She exhibits normal muscle tone. She displays no seizure activity. Coordination normal.  No pronator drift bilateral upper extrem, able to hold both legs off bed for 5 seconds, sensation intact in all extremities, no visual field cuts, no left or right sided neglect  Skin: Skin is warm and dry. No rash noted.  Psychiatric: She has a normal mood and affect.    ED Course  Procedures (including critical care time) Labs Review Labs Reviewed  BASIC METABOLIC PANEL - Abnormal; Notable for the following:    Glucose, Bld 178 (*)    GFR calc non Af Amer 53 (*)    GFR calc Af Amer 62 (*)    All other components within normal limits  URINALYSIS, ROUTINE W REFLEX MICROSCOPIC - Abnormal; Notable for the following:    Hgb urine dipstick TRACE (*)    All other components within normal limits  CBC WITH DIFFERENTIAL  URINE MICROSCOPIC-ADD ON   Imaging Review No results found.  EKG Interpretation   None      Reviewed the patient's old records. She had an MRI a few days ago that did not show evidence of stroke or hemorrhage. MDM   1. Confusion    The patient doesn't appear to be confused here. She's not having any trouble with fever to suggest an acute infection. Her laboratory tests and evaluation is unremarkable. She was able to walk around the emergency department. Is possible she could be developing dementia or this may be related to her medications. at this time I  do not feel there is evidence of an acute emergency medical condition. Patient stable to discharge home and have her follow up with her primary Dr.    Celene Kras, MD 11/19/12 [phone]

## 2012-11-19 NOTE — ED Notes (Signed)
Pt ambulated with walker and minimal assistance from staff with success. Pt became shortly became tired but was able to return to bed without difficulty.

## 2012-11-19 NOTE — ED Notes (Signed)
Pt brought to the ed by family and caregiver, stated that she was seen in the ed last week and dx w/ vertigo, in the last week she has been sleeping more than normal and having periods of confusion and hallucinations. Today she is seeing people "sitting in the living room, just sitting around".  Pt making  In appropriate statements in triage.

## 2012-11-20 ENCOUNTER — Telehealth: Payer: Self-pay | Admitting: Family Medicine

## 2012-11-20 DIAGNOSIS — F0391 Unspecified dementia with behavioral disturbance: Secondary | ICD-10-CM

## 2012-11-20 DIAGNOSIS — F03918 Unspecified dementia, unspecified severity, with other behavioral disturbance: Secondary | ICD-10-CM

## 2012-11-20 NOTE — Telephone Encounter (Signed)
Spoke with pt son Hallucinations, confusion over past week,seen in ED, no source of infection Discussed neurology will proceed Has PT being worked on will try to get with [facility]

## 2012-11-20 NOTE — Telephone Encounter (Signed)
Message copied by Donne Anon on Tue Nov 20, 2012  4:32 PM ------      Message from: Lynne Logan      Created: Tue Nov 20, 2012 10:56 AM      Regarding: referral      Contact: [phone]       Needs a referral for Dr. Gerilyn Pilgrim.  Please call her son Claiborne Rigg. ------

## 2012-11-22 ENCOUNTER — Ambulatory Visit (INDEPENDENT_AMBULATORY_CARE_PROVIDER_SITE_OTHER): Payer: Medicare Other | Admitting: Pharmacist Clinician (PhC)/ Clinical Pharmacy Specialist

## 2012-11-22 DIAGNOSIS — I480 Paroxysmal atrial fibrillation: Secondary | ICD-10-CM

## 2012-11-22 DIAGNOSIS — Z7901 Long term (current) use of anticoagulants: Secondary | ICD-10-CM

## 2012-11-22 DIAGNOSIS — I4891 Unspecified atrial fibrillation: Secondary | ICD-10-CM

## 2012-11-22 DIAGNOSIS — H8110 Benign paroxysmal vertigo, unspecified ear: Secondary | ICD-10-CM

## 2012-11-22 DIAGNOSIS — M6281 Muscle weakness (generalized): Secondary | ICD-10-CM

## 2012-11-22 DIAGNOSIS — Z5189 Encounter for other specified aftercare: Secondary | ICD-10-CM

## 2012-11-22 DIAGNOSIS — F0390 Unspecified dementia without behavioral disturbance: Secondary | ICD-10-CM

## 2012-11-23 ENCOUNTER — Telehealth: Payer: Self-pay | Admitting: Family Medicine

## 2012-11-23 NOTE — Telephone Encounter (Signed)
Left message on vm to go ahead with the verbal orders

## 2012-11-23 NOTE — Telephone Encounter (Signed)
Okay to give verbal order.

## 2012-11-23 NOTE — Telephone Encounter (Signed)
Monique from Therapy called and wanted a verbal order on Layal Javid for OT eval and treatment and ST eval and treatment. Is it ok to tell her to go ahead with the evaluation?

## 2012-11-26 ENCOUNTER — Telehealth: Payer: Self-pay | Admitting: Family Medicine

## 2012-11-26 NOTE — Telephone Encounter (Signed)
Patient BP was 172/80 . Just wanted to let someone know Speech Therapy . A[employer] . Needs verbal consent to start Therapy. Contact Toniann Fail.

## 2012-11-27 NOTE — Telephone Encounter (Signed)
Toniann Fail called from Home health.  Needed OK for Speech therapy.  Verbal OK given.

## 2012-11-28 ENCOUNTER — Telehealth: Payer: Self-pay | Admitting: Family Medicine

## 2012-11-28 NOTE — Telephone Encounter (Signed)
Noted, agree

## 2012-11-28 NOTE — Telephone Encounter (Signed)
Helen [name], [facility] [name] called.  Patient has declined Occupational Heath at this time.  Is being seen by Physical Therapy but has declined OT.

## 2012-11-29 ENCOUNTER — Ambulatory Visit (INDEPENDENT_AMBULATORY_CARE_PROVIDER_SITE_OTHER): Payer: Medicare Other | Admitting: Pharmacist Clinician (PhC)/ Clinical Pharmacy Specialist

## 2012-11-29 DIAGNOSIS — I480 Paroxysmal atrial fibrillation: Secondary | ICD-10-CM

## 2012-11-29 DIAGNOSIS — Z7901 Long term (current) use of anticoagulants: Secondary | ICD-10-CM

## 2012-11-29 DIAGNOSIS — I4891 Unspecified atrial fibrillation: Secondary | ICD-10-CM

## 2012-11-29 NOTE — Telephone Encounter (Signed)
noted 

## 2012-12-04 ENCOUNTER — Ambulatory Visit: Payer: Self-pay | Admitting: Family Medicine

## 2012-12-06 ENCOUNTER — Ambulatory Visit (INDEPENDENT_AMBULATORY_CARE_PROVIDER_SITE_OTHER): Payer: Medicare Other | Admitting: Pharmacist Clinician (PhC)/ Clinical Pharmacy Specialist

## 2012-12-06 DIAGNOSIS — I4891 Unspecified atrial fibrillation: Secondary | ICD-10-CM

## 2012-12-06 DIAGNOSIS — Z7901 Long term (current) use of anticoagulants: Secondary | ICD-10-CM

## 2012-12-06 DIAGNOSIS — I480 Paroxysmal atrial fibrillation: Secondary | ICD-10-CM

## 2012-12-09 ENCOUNTER — Other Ambulatory Visit: Payer: Self-pay | Admitting: Family Medicine

## 2012-12-12 ENCOUNTER — Ambulatory Visit (INDEPENDENT_AMBULATORY_CARE_PROVIDER_SITE_OTHER): Payer: Medicare Other | Admitting: Family Medicine

## 2012-12-12 ENCOUNTER — Encounter: Payer: Self-pay | Admitting: Family Medicine

## 2012-12-12 VITALS — BP 130/80 | HR 68 | Temp 97.3°F | Resp 18 | Wt 185.0 lb

## 2012-12-12 DIAGNOSIS — F0390 Unspecified dementia without behavioral disturbance: Secondary | ICD-10-CM

## 2012-12-12 DIAGNOSIS — I10 Essential (primary) hypertension: Secondary | ICD-10-CM

## 2012-12-12 NOTE — Patient Instructions (Signed)
Stop the meclizine and Zofran Continue all other medications Blood pressure looks much better F/U 3 months

## 2012-12-13 ENCOUNTER — Telehealth: Payer: Self-pay | Admitting: Cardiovascular Disease

## 2012-12-13 LAB — POCT INR: INR: 4

## 2012-12-13 NOTE — Assessment & Plan Note (Signed)
BP better regulated with BID dosing of meds, continue

## 2012-12-13 NOTE — Assessment & Plan Note (Addendum)
Seen by neurology, no change to meds needed  Discussed dementia with patient and how it effects her Consider additional Namenda

## 2012-12-13 NOTE — Progress Notes (Signed)
  Subjective:    Patient ID: Helen [name], female    DOB: [birth year], 79 y.o.   MRN: [id number]  HPI  Pt here to f/u previous visit. Noted to have episodes of AMS and a fall. Both have now cleared and she is back at baseline. Seen by neurology, no changes to meds for dementia, she is having nerve conduction studies done on UE. She declined OT, but still getting PT.  HTN- BP has been in 130's systolic per caregiver, denies any dizziness or headaches. No longer requires meclizine    Review of Systems  GEN- denies fatigue, fever, weight loss,weakness, recent illness HEENT- denies eye drainage, change in vision, nasal discharge, CVS- denies chest pain, palpitations RESP- denies SOB, cough, wheeze Neuro- denies headache, dizziness, syncope, seizure activity      Objective:   Physical Exam  GEN- NAD, alert and oriented x3, well appearing, sitting in wheelchair CVS- RRR, no murmur RESP-CTAB EXT- No edema Pulses- Radial,2+ Psych- normal affect and mood     Assessment & Plan:

## 2012-12-13 NOTE — Telephone Encounter (Signed)
Son doses warfarin - spoke with him, no changes in meds, no signs of bleeding.  He will adjust and recheck next week

## 2012-12-13 NOTE — Telephone Encounter (Signed)
Alfonso Patten w/ [facility] called in INR 4.0 for pt.  Message forwarded to K. Alvstad, PharmD.

## 2012-12-15 ENCOUNTER — Other Ambulatory Visit: Payer: Self-pay | Admitting: Family Medicine

## 2012-12-18 ENCOUNTER — Other Ambulatory Visit: Payer: Self-pay | Admitting: Family Medicine

## 2012-12-20 ENCOUNTER — Ambulatory Visit (INDEPENDENT_AMBULATORY_CARE_PROVIDER_SITE_OTHER): Payer: Medicare Other | Admitting: Pharmacist Clinician (PhC)/ Clinical Pharmacy Specialist

## 2012-12-20 DIAGNOSIS — I4891 Unspecified atrial fibrillation: Secondary | ICD-10-CM

## 2012-12-20 DIAGNOSIS — I480 Paroxysmal atrial fibrillation: Secondary | ICD-10-CM

## 2012-12-20 DIAGNOSIS — Z7901 Long term (current) use of anticoagulants: Secondary | ICD-10-CM

## 2013-01-01 ENCOUNTER — Ambulatory Visit (INDEPENDENT_AMBULATORY_CARE_PROVIDER_SITE_OTHER): Payer: Medicare Other | Admitting: Pharmacist Clinician (PhC)/ Clinical Pharmacy Specialist

## 2013-01-01 DIAGNOSIS — Z7901 Long term (current) use of anticoagulants: Secondary | ICD-10-CM

## 2013-01-01 DIAGNOSIS — I480 Paroxysmal atrial fibrillation: Secondary | ICD-10-CM

## 2013-01-01 DIAGNOSIS — I4891 Unspecified atrial fibrillation: Secondary | ICD-10-CM

## 2013-01-02 ENCOUNTER — Ambulatory Visit (INDEPENDENT_AMBULATORY_CARE_PROVIDER_SITE_OTHER): Payer: Medicare Other | Admitting: Family Medicine

## 2013-01-02 VITALS — BP 124/70 | HR 62 | Temp 97.7°F | Resp 18

## 2013-01-02 DIAGNOSIS — R4182 Altered mental status, unspecified: Secondary | ICD-10-CM

## 2013-01-02 DIAGNOSIS — F0390 Unspecified dementia without behavioral disturbance: Secondary | ICD-10-CM

## 2013-01-02 DIAGNOSIS — B372 Candidiasis of skin and nail: Secondary | ICD-10-CM

## 2013-01-02 LAB — COMPREHENSIVE METABOLIC PANEL
BUN: 18 mg/dL (ref 6–23)
CO2: 23 mEq/L (ref 19–32)
Calcium: 9.7 mg/dL (ref 8.4–10.5)
Chloride: 100 mEq/L (ref 96–112)
Creat: 0.88 mg/dL (ref 0.50–1.10)
Glucose, Bld: 179 mg/dL — ABNORMAL HIGH (ref 70–99)
Potassium: 4.7 mEq/L (ref 3.5–5.3)

## 2013-01-02 LAB — CBC WITH DIFFERENTIAL/PLATELET
Basophils Absolute: 0 10*3/uL (ref 0.0–0.1)
Eosinophils Absolute: 0.1 10*3/uL (ref 0.0–0.7)
HCT: 40.2 % (ref 36.0–46.0)
Hemoglobin: 14 g/dL (ref 12.0–15.0)
Lymphocytes Relative: 26 % (ref 12–46)
Lymphs Abs: 1.9 10*3/uL (ref 0.7–4.0)
Monocytes Absolute: 0.3 10*3/uL (ref 0.1–1.0)
Monocytes Relative: 5 % (ref 3–12)
Neutro Abs: 4.9 10*3/uL (ref 1.7–7.7)
RBC: 4.36 MIL/uL (ref 3.87–5.11)
WBC: 7.2 10*3/uL (ref 4.0–10.5)

## 2013-01-02 MED ORDER — CLOTRIMAZOLE 1 % EX CREA: 1.0000 "application " | TOPICAL_CREAM | Freq: Two times a day (BID) | CUTANEOUS | Status: AC

## 2013-01-02 NOTE — Progress Notes (Signed)
   Subjective:    Patient ID: Helen [name], female    DOB: 11-13-1924, 79 y.o.   MRN: [id number]  HPI  She here with her caregiver. He of note is that she has been confused on and off and sleeping more during the day past couple of weeks. She denies any pain anywhere and denies that this is been going on. They described that she has been in the dining room tried to undress for the bed as well as pretending that the dining room was the bathroom so she could urinate. She's not had any falls recently. They've been giving her medications as prescribed. Her appetite has been okay. She's not had any fever or cough. Her Sugars have been good  Review of Systems  GEN- denies fatigue, fever, weight loss,weakness, recent illness HEENT- denies eye drainage, change in vision, nasal discharge, CVS- denies chest pain, palpitations RESP- denies SOB, cough, wheeze ABD- denies N/V, change in stools, abd pain GU- denies dysuria, hematuria, dribbling, incontinence MSK- denies joint pain, muscle aches, injury Neuro- denies headache, dizziness, syncope, seizure activity      Objective:   Physical Exam GEN- NAD, alert and oriented x3, well appearing, sitting in wheelchair HEENT-PERRL, EOMI, non icteric, MMM CVS- RRR, no murmur RESP-CTAB ABD-NABS,soft,NT,ND Skin- erythema over vaginal area with maercation of skin, erythema extens to gluteal cleft, very TTP, unable to do vaginal exam or reach urethra without severe pain, no bleeding noted,  EXT- No edema Pulses- Radial,2+ Psych- normal affect and mood Neuro- CNII-XII, no focal deficits, normal speech, answers appropriately        Assessment & Plan:

## 2013-01-02 NOTE — Patient Instructions (Signed)
Use the clotrimazole cream twice a day to her bottom, pat dry Change pads every couple of hours  Start new dementia, memory medication per instructions on the packet - I will f/u via phone to see how she is doing on the new medication We will call with lab results F/U as previous

## 2013-01-04 ENCOUNTER — Encounter: Payer: Self-pay | Admitting: Family Medicine

## 2013-01-04 DIAGNOSIS — B372 Candidiasis of skin and nail: Secondary | ICD-10-CM | POA: Insufficient documentation

## 2013-01-04 NOTE — Assessment & Plan Note (Signed)
I discussed with the care givers need to change her depends on a regular basis if she is not sitting in urine. She has skin breakdown as well as fungal rash noted. I was unable to get a urine sample secondary to severe pain with touching of the skin.  Put her on topical clotrimazole they also have a barrier cream.

## 2013-01-04 NOTE — Assessment & Plan Note (Addendum)
I think we're dealing with worsening dementia. I'll add Namenda to her Aricept. She does not have any focal deficits on exam. Unfortunately a was unable to get a urine sample Labs to be checked

## 2013-01-07 ENCOUNTER — Telehealth: Payer: Self-pay | Admitting: Cardiovascular Disease

## 2013-01-07 ENCOUNTER — Ambulatory Visit (INDEPENDENT_AMBULATORY_CARE_PROVIDER_SITE_OTHER): Payer: Medicare Other | Admitting: Pharmacist Clinician (PhC)/ Clinical Pharmacy Specialist

## 2013-01-07 DIAGNOSIS — Z7901 Long term (current) use of anticoagulants: Secondary | ICD-10-CM

## 2013-01-07 DIAGNOSIS — I480 Paroxysmal atrial fibrillation: Secondary | ICD-10-CM

## 2013-01-07 DIAGNOSIS — I4891 Unspecified atrial fibrillation: Secondary | ICD-10-CM

## 2013-01-07 LAB — POCT INR: INR: 3.1

## 2013-01-07 NOTE — Telephone Encounter (Signed)
Helen [name] gave a verbal INR of 3.1.  Results given to [facility]

## 2013-01-08 ENCOUNTER — Other Ambulatory Visit: Payer: Self-pay | Admitting: Family Medicine

## 2013-01-08 NOTE — Telephone Encounter (Signed)
Medication refilled per protocol. 

## 2013-01-09 ENCOUNTER — Ambulatory Visit (INDEPENDENT_AMBULATORY_CARE_PROVIDER_SITE_OTHER): Payer: Medicare Other | Admitting: Pharmacist Clinician (PhC)/ Clinical Pharmacy Specialist

## 2013-01-09 DIAGNOSIS — Z7901 Long term (current) use of anticoagulants: Secondary | ICD-10-CM

## 2013-01-09 DIAGNOSIS — I4891 Unspecified atrial fibrillation: Secondary | ICD-10-CM

## 2013-01-09 DIAGNOSIS — I480 Paroxysmal atrial fibrillation: Secondary | ICD-10-CM

## 2013-01-17 ENCOUNTER — Telehealth: Payer: Self-pay | Admitting: Cardiovascular Disease

## 2013-01-17 ENCOUNTER — Ambulatory Visit (INDEPENDENT_AMBULATORY_CARE_PROVIDER_SITE_OTHER): Payer: Medicare Other | Admitting: Pharmacist Clinician (PhC)/ Clinical Pharmacy Specialist

## 2013-01-17 DIAGNOSIS — I480 Paroxysmal atrial fibrillation: Secondary | ICD-10-CM

## 2013-01-17 DIAGNOSIS — Z7901 Long term (current) use of anticoagulants: Secondary | ICD-10-CM

## 2013-01-17 DIAGNOSIS — I4891 Unspecified atrial fibrillation: Secondary | ICD-10-CM

## 2013-01-17 NOTE — Telephone Encounter (Signed)
INR 1.9, no changes necessary, son doses patient, checks weekly.

## 2013-01-17 NOTE — Telephone Encounter (Signed)
Belenda Cruise, pharmacist, notified and picked up call.

## 2013-01-23 LAB — POCT INR: INR: 3.6

## 2013-01-24 ENCOUNTER — Ambulatory Visit (INDEPENDENT_AMBULATORY_CARE_PROVIDER_SITE_OTHER): Payer: Medicare Other | Admitting: Pharmacist Clinician (PhC)/ Clinical Pharmacy Specialist

## 2013-01-24 ENCOUNTER — Telehealth: Payer: Self-pay | Admitting: Cardiovascular Disease

## 2013-01-24 DIAGNOSIS — I480 Paroxysmal atrial fibrillation: Secondary | ICD-10-CM

## 2013-01-24 DIAGNOSIS — I4891 Unspecified atrial fibrillation: Secondary | ICD-10-CM

## 2013-01-24 DIAGNOSIS — Z7901 Long term (current) use of anticoagulants: Secondary | ICD-10-CM

## 2013-01-24 NOTE — Telephone Encounter (Signed)
Reports out of range INR 3.6 on 12.17.14.  Message forwarded to K. Alvstad, PharmD.

## 2013-02-03 ENCOUNTER — Telehealth: Payer: Self-pay | Admitting: Family Medicine

## 2013-02-03 MED ORDER — MEMANTINE HCL 10 MG PO TABS: 10.0000 mg | ORAL_TABLET | Freq: Two times a day (BID) | ORAL | Status: DC

## 2013-02-03 NOTE — Telephone Encounter (Signed)
Pt son called, needed refill on namenda, doing well on medication Pharmacy did not have 28mg  ER available So 10mg  BID called in

## 2013-02-04 ENCOUNTER — Ambulatory Visit (INDEPENDENT_AMBULATORY_CARE_PROVIDER_SITE_OTHER): Payer: Medicare Other | Admitting: Pharmacist Clinician (PhC)/ Clinical Pharmacy Specialist

## 2013-02-04 DIAGNOSIS — I480 Paroxysmal atrial fibrillation: Secondary | ICD-10-CM

## 2013-02-04 DIAGNOSIS — I4891 Unspecified atrial fibrillation: Secondary | ICD-10-CM

## 2013-02-04 DIAGNOSIS — Z7901 Long term (current) use of anticoagulants: Secondary | ICD-10-CM

## 2013-02-06 LAB — POCT INR: INR: 1.5

## 2013-02-08 ENCOUNTER — Other Ambulatory Visit: Payer: Self-pay | Admitting: Cardiovascular Disease

## 2013-02-08 ENCOUNTER — Telehealth: Payer: Self-pay | Admitting: Cardiovascular Disease

## 2013-02-08 ENCOUNTER — Other Ambulatory Visit: Payer: Self-pay | Admitting: Family Medicine

## 2013-02-08 NOTE — Telephone Encounter (Signed)
Sent to [place] to review

## 2013-02-14 ENCOUNTER — Ambulatory Visit (INDEPENDENT_AMBULATORY_CARE_PROVIDER_SITE_OTHER): Payer: Medicare Other | Admitting: Pharmacist Clinician (PhC)/ Clinical Pharmacy Specialist

## 2013-02-14 DIAGNOSIS — I480 Paroxysmal atrial fibrillation: Secondary | ICD-10-CM

## 2013-02-14 DIAGNOSIS — Z7901 Long term (current) use of anticoagulants: Secondary | ICD-10-CM

## 2013-02-14 DIAGNOSIS — I4891 Unspecified atrial fibrillation: Secondary | ICD-10-CM

## 2013-02-20 LAB — POCT INR: INR: 3.2

## 2013-02-21 ENCOUNTER — Telehealth: Payer: Self-pay | Admitting: Cardiovascular Disease

## 2013-02-21 ENCOUNTER — Ambulatory Visit (INDEPENDENT_AMBULATORY_CARE_PROVIDER_SITE_OTHER): Payer: Medicare Other | Admitting: Pharmacist Clinician (PhC)/ Clinical Pharmacy Specialist

## 2013-02-21 DIAGNOSIS — Z7901 Long term (current) use of anticoagulants: Secondary | ICD-10-CM

## 2013-02-21 DIAGNOSIS — I4891 Unspecified atrial fibrillation: Secondary | ICD-10-CM

## 2013-02-21 DIAGNOSIS — I480 Paroxysmal atrial fibrillation: Secondary | ICD-10-CM

## 2013-02-21 NOTE — Telephone Encounter (Signed)
Helen [name] has handled.  See anti-coag note.

## 2013-02-28 LAB — POCT INR: INR: 2.9

## 2013-03-01 ENCOUNTER — Ambulatory Visit (INDEPENDENT_AMBULATORY_CARE_PROVIDER_SITE_OTHER): Payer: Medicare Other | Admitting: Pharmacist Clinician (PhC)/ Clinical Pharmacy Specialist

## 2013-03-01 DIAGNOSIS — Z7901 Long term (current) use of anticoagulants: Secondary | ICD-10-CM

## 2013-03-01 DIAGNOSIS — I4891 Unspecified atrial fibrillation: Secondary | ICD-10-CM

## 2013-03-01 DIAGNOSIS — I480 Paroxysmal atrial fibrillation: Secondary | ICD-10-CM

## 2013-03-07 LAB — POCT INR: INR: 2.5

## 2013-03-11 ENCOUNTER — Other Ambulatory Visit: Payer: Self-pay | Admitting: Family Medicine

## 2013-03-12 ENCOUNTER — Ambulatory Visit (INDEPENDENT_AMBULATORY_CARE_PROVIDER_SITE_OTHER): Payer: Medicare Other | Admitting: Pharmacist Clinician (PhC)/ Clinical Pharmacy Specialist

## 2013-03-12 DIAGNOSIS — I480 Paroxysmal atrial fibrillation: Secondary | ICD-10-CM

## 2013-03-12 DIAGNOSIS — I4891 Unspecified atrial fibrillation: Secondary | ICD-10-CM

## 2013-03-12 DIAGNOSIS — Z7901 Long term (current) use of anticoagulants: Secondary | ICD-10-CM

## 2013-03-15 ENCOUNTER — Telehealth: Payer: Self-pay | Admitting: *Deleted

## 2013-03-15 ENCOUNTER — Ambulatory Visit (INDEPENDENT_AMBULATORY_CARE_PROVIDER_SITE_OTHER): Payer: Medicare Other | Admitting: Family Medicine

## 2013-03-15 ENCOUNTER — Encounter: Payer: Self-pay | Admitting: Family Medicine

## 2013-03-15 VITALS — BP 120/80 | HR 78 | Temp 98.2°F | Resp 18

## 2013-03-15 DIAGNOSIS — Z23 Encounter for immunization: Secondary | ICD-10-CM

## 2013-03-15 DIAGNOSIS — F0390 Unspecified dementia without behavioral disturbance: Secondary | ICD-10-CM

## 2013-03-15 LAB — POCT INR: INR: 2.6

## 2013-03-15 NOTE — Patient Instructions (Signed)
Continue current medications I will try to get her the once a day Namenda 28mg  Prevnar Booster given F/U 4 months

## 2013-03-15 NOTE — Telephone Encounter (Signed)
Called [facility] adn spoke to [employer] and gave verbal order to D/C PT , pt declines at this time.

## 2013-03-17 MED ORDER — MEMANTINE HCL ER 28 MG PO CP24: ORAL_CAPSULE | ORAL | Status: DC

## 2013-03-17 NOTE — Progress Notes (Signed)
Patient ID: Helen [name], female   DOB: Aug 11, 1924, 80 y.o.   MRN: [id number]   Subjective:    Patient ID: Helen [name], female    DOB: 25-Jan-1925, 80 y.o.   MRN: [id number]  Patient presents for 3 mos follow up  Pt here to f/u Dementia, about 2 months ago she was started on Namenda due to worsening dementia, family feels she has done well with the medication. She had 1 fall a few weeks ago, where her knee gave out on her and she slid to the floor, no injury resulted. She has been up with the walker around the home. No new complaints or concerns. She is eating well.     Review Of Systems:  GEN- denies fatigue, fever, weight loss,weakness, recent illness HEENT- denies eye drainage, change in vision, nasal discharge, CVS- denies chest pain, palpitations RESP- denies SOB, cough, wheeze MSK- denies joint pain, muscle aches, injury Neuro- denies headache, dizziness, syncope, seizure activity       Objective:    BP 120/80  Pulse 78  Temp(Src) 98.2 F (36.8 C) (Oral)  Resp 18 GEN- NAD, alert and oriented x3, sitting wheelchair HEENT- PERRL, EOMI, non injected sclera, pink conjunctiva, MMM, oropharynx clear CVS- RRR, no murmur RESP-CTAB EXT- No edema Pulses- Radial 2+ Psych- normal affect and mood        Assessment & Plan:      Problem List Items Addressed This Visit   Dementia (Chronic)     She has improved in the intermin with Namenda on board, continue dual therapy Will change to Namenda 28mg  XR Note I will d/c the current PT, pt declines, I do not remember restarting, family sees no benefit at this time     Other Visit Diagnoses   Need for prophylactic vaccination against Streptococcus pneumoniae (pneumococcus)    -  Primary    Relevant Orders       Pneumococcal conjugate vaccine 13-valent (Completed)       Note: This dictation was prepared with Dragon dictation along with smaller phrase technology. Any transcriptional errors that result from this process are  unintentional.

## 2013-03-17 NOTE — Assessment & Plan Note (Signed)
She has improved in the intermin with Namenda on board, continue dual therapy Will change to Namenda 28mg  XR Note I will d/c the current PT, pt declines, I do not remember restarting, family sees no benefit at this time

## 2013-03-19 ENCOUNTER — Ambulatory Visit (INDEPENDENT_AMBULATORY_CARE_PROVIDER_SITE_OTHER): Payer: Medicare Other | Admitting: Pharmacist Clinician (PhC)/ Clinical Pharmacy Specialist

## 2013-03-19 ENCOUNTER — Ambulatory Visit: Payer: PRIVATE HEALTH INSURANCE | Admitting: Family Medicine

## 2013-03-19 DIAGNOSIS — I480 Paroxysmal atrial fibrillation: Secondary | ICD-10-CM

## 2013-03-19 DIAGNOSIS — Z7901 Long term (current) use of anticoagulants: Secondary | ICD-10-CM

## 2013-03-19 DIAGNOSIS — I4891 Unspecified atrial fibrillation: Secondary | ICD-10-CM

## 2013-03-22 ENCOUNTER — Ambulatory Visit (INDEPENDENT_AMBULATORY_CARE_PROVIDER_SITE_OTHER): Payer: Medicare Other | Admitting: Pharmacist Clinician (PhC)/ Clinical Pharmacy Specialist

## 2013-03-22 DIAGNOSIS — I480 Paroxysmal atrial fibrillation: Secondary | ICD-10-CM

## 2013-03-22 DIAGNOSIS — I4891 Unspecified atrial fibrillation: Secondary | ICD-10-CM

## 2013-03-22 DIAGNOSIS — Z7901 Long term (current) use of anticoagulants: Secondary | ICD-10-CM

## 2013-03-22 LAB — POCT INR: INR: 2.6

## 2013-03-28 LAB — POCT INR: INR: 2.6

## 2013-03-29 ENCOUNTER — Ambulatory Visit (INDEPENDENT_AMBULATORY_CARE_PROVIDER_SITE_OTHER): Payer: Medicare Other | Admitting: Pharmacist Clinician (PhC)/ Clinical Pharmacy Specialist

## 2013-03-29 DIAGNOSIS — Z7901 Long term (current) use of anticoagulants: Secondary | ICD-10-CM

## 2013-03-29 DIAGNOSIS — I4891 Unspecified atrial fibrillation: Secondary | ICD-10-CM

## 2013-03-29 DIAGNOSIS — I480 Paroxysmal atrial fibrillation: Secondary | ICD-10-CM

## 2013-04-04 LAB — POCT INR: INR: 2.8

## 2013-04-05 ENCOUNTER — Ambulatory Visit (INDEPENDENT_AMBULATORY_CARE_PROVIDER_SITE_OTHER): Payer: Medicare Other | Admitting: Pharmacist Clinician (PhC)/ Clinical Pharmacy Specialist

## 2013-04-05 DIAGNOSIS — Z7901 Long term (current) use of anticoagulants: Secondary | ICD-10-CM

## 2013-04-05 DIAGNOSIS — I480 Paroxysmal atrial fibrillation: Secondary | ICD-10-CM

## 2013-04-05 DIAGNOSIS — I4891 Unspecified atrial fibrillation: Secondary | ICD-10-CM

## 2013-04-09 ENCOUNTER — Other Ambulatory Visit: Payer: Self-pay | Admitting: Family Medicine

## 2013-04-09 ENCOUNTER — Encounter: Payer: Self-pay | Admitting: Cardiovascular Disease

## 2013-04-09 NOTE — Telephone Encounter (Signed)
Refill appropriate and filled per protocol. 

## 2013-04-10 LAB — POCT INR: INR: 2.9

## 2013-04-11 ENCOUNTER — Ambulatory Visit (INDEPENDENT_AMBULATORY_CARE_PROVIDER_SITE_OTHER): Payer: Medicare Other | Admitting: Pharmacist Clinician (PhC)/ Clinical Pharmacy Specialist

## 2013-04-11 DIAGNOSIS — I480 Paroxysmal atrial fibrillation: Secondary | ICD-10-CM

## 2013-04-11 DIAGNOSIS — I4891 Unspecified atrial fibrillation: Secondary | ICD-10-CM

## 2013-04-11 DIAGNOSIS — Z7901 Long term (current) use of anticoagulants: Secondary | ICD-10-CM

## 2013-04-17 LAB — POCT INR: INR: 2.8

## 2013-04-20 ENCOUNTER — Ambulatory Visit (INDEPENDENT_AMBULATORY_CARE_PROVIDER_SITE_OTHER): Payer: Medicare Other | Admitting: Pharmacist Clinician (PhC)/ Clinical Pharmacy Specialist

## 2013-04-20 DIAGNOSIS — I480 Paroxysmal atrial fibrillation: Secondary | ICD-10-CM

## 2013-04-20 DIAGNOSIS — I4891 Unspecified atrial fibrillation: Secondary | ICD-10-CM

## 2013-04-20 DIAGNOSIS — Z7901 Long term (current) use of anticoagulants: Secondary | ICD-10-CM

## 2013-04-22 ENCOUNTER — Encounter: Payer: Self-pay | Admitting: Physician Assistant

## 2013-04-22 ENCOUNTER — Ambulatory Visit (INDEPENDENT_AMBULATORY_CARE_PROVIDER_SITE_OTHER): Payer: Medicare Other | Admitting: Physician Assistant

## 2013-04-22 VITALS — BP 114/70 | HR 60 | Temp 98.5°F | Resp 18

## 2013-04-22 DIAGNOSIS — J988 Other specified respiratory disorders: Principal | ICD-10-CM

## 2013-04-22 DIAGNOSIS — B9789 Other viral agents as the cause of diseases classified elsewhere: Secondary | ICD-10-CM

## 2013-04-22 MED ORDER — BENZONATATE 200 MG PO CAPS: 200.0000 mg | ORAL_CAPSULE | Freq: Two times a day (BID) | ORAL | Status: DC | PRN

## 2013-04-22 NOTE — Progress Notes (Signed)
Patient ID: Helen [name] MRN: [id number], DOB: 04/08/1924, 80 y.o. Date of Encounter: 04/22/2013, 12:55 PM    Chief Complaint:  Chief Complaint  Patient presents with  . c/o bad cough     HPI: 80 y.o. year old female here with grandson. They both report that she just developed a cough yesterday. She has had no fever and no chills. No nasal mucosal congestion. No sore throat. No ear ache. No orthopnea. No PND. No increased lower extremity edema.     Home Meds: See attached medication section for any medications that were entered at today's visit. The computer does not put those onto this list.The following list is a list of meds entered prior to today's visit.   Current Outpatient Prescriptions on File Prior to Visit  Medication Sig Dispense Refill  . acetaminophen (TYLENOL) 500 MG tablet Take 500 mg by mouth 2 (two) times daily as needed for pain.      Marland Kitchen atorvastatin (LIPITOR) 10 MG tablet TAKE 1 TABLET BY MOUTH EVERY DAY AT 6 PM  30 tablet  6  . clotrimazole (LOTRIMIN) 1 % cream Apply 1 application topically 2 (two) times daily.  30 g  0  . donepezil (ARICEPT) 10 MG tablet TAKE 1 TABLET BY MOUTH EVERY DAY  30 tablet  6  . escitalopram (LEXAPRO) 5 MG tablet TAKE 1 TABLET BY MOUTH EVERY MORNING  30 tablet  6  . fesoterodine (TOVIAZ) 8 MG TB24 Take 8 mg by mouth at bedtime.       . folic acid (FOLVITE) 1 MG tablet Take 1 mg by mouth every morning. At 700      . glipiZIDE (GLUCOTROL) 5 MG tablet TAKE 1 TABLET BY MOUTH EVERY DAY  30 tablet  6  . levETIRAcetam (KEPPRA) 500 MG tablet TAKE 1 TABLET BY MOUTH TWICE DAILY  60 tablet  6  . lisinopril (PRINIVIL,ZESTRIL) 10 MG tablet TAKE 1 TABLET BY MOUTH EVERY DAY  30 tablet  6  . Memantine HCl ER (NAMENDA XR) 28 MG CP24 1 tablet daily for dementia  30 capsule  6  . metoprolol tartrate (LOPRESSOR) 25 MG tablet Take 0.5 tablets (12.5 mg total) by mouth 2 (two) times daily.  60 tablet  3  . mirabegron ER (MYRBETRIQ) 50 MG TB24 Take 50 mg by  mouth at bedtime.       . Multiple Vitamins-Minerals (CENTRUM SILVER ULTRA WOMENS PO) Take 1 tablet by mouth every morning. At 700      . potassium chloride (K-DUR) 10 MEQ tablet Take 20 mEq by mouth daily.       . protective barrier (RESTORE) CREA Apply 1 application topically 3 (three) times a week.       . warfarin (COUMADIN) 4 MG tablet Take 4-8 mg by mouth See admin instructions. Takes 4mg  on Monday, Wednesday and Friday.  And 8mg  all other days.      Marland Kitchen zolpidem (AMBIEN) 5 MG tablet Take 5 mg by mouth at bedtime as needed for sleep.       No current facility-administered medications on file prior to visit.    Allergies:  Allergies  Allergen Reactions  . Ciprofloxacin     Patient states that she may have had a stroke due to this medication  . Niacin And Related Hives  . Statins Other (See Comments)    Some but not all       Review of Systems: See HPI for pertinent ROS. All other ROS negative.  Physical Exam: Blood pressure 114/70, pulse 60, temperature 98.5 F (36.9 C), temperature source Oral, resp. rate 18, height 0' (0 m), weight 0 lb (0 kg), SpO2 97.00%., Cannot calculate BMI with a height equal to zero. General:  WNWD Female. Sitting in wheelchair comfortably. Appears in no acute distress. She Coughs several times through the visit. Sounds like a rather hacky cough and does not sound like a deep congested cough or a cough concerning for heart failure.  HEENT: Normocephalic, atraumatic, eyes without discharge, sclera non-icteric, nares are without discharge. Bilateral auditory canals clear, TM's are without perforation, pearly grey and translucent with reflective cone of light bilaterally. Oral cavity moist, posterior pharynx without exudate, erythema, peritonsillar abscess. No tenderness with percussion of frontal or maxillary sinuses.  Neck: Supple. No thyromegaly. No lymphadenopathy. Lungs: Clear bilaterally to auscultation without wheezes, rales, or rhonchi. Breathing is  unlabored.Lungs are clear throughout . Heart: Regular rhythm. No murmurs, rubs, or gallops. Msk:  Strength and tone normal for age. Extremities/Skin: Warm and dry. No edema.  Neuro: Alert and oriented X 3. Moves all extremities spontaneously. Gait is normal. CNII-XII grossly in tact. Psych:  Responds to questions appropriately with a normal affect.     ASSESSMENT AND PLAN:  80 y.o. year old female with  1. Viral respiratory infection I discussed with them that I feel that this is a viral illness. Discussed that if this is the case then it should run its course and resolve on its own within about 7 days. Discussed that if she develops a fever or if symptoms persist greater than 7 days without resolution then to followup with [place]. Currently symptoms are not consistent with heart failure symptoms. If symptoms change then followup. - benzonatate (TESSALON) 200 MG capsule; Take 1 capsule (200 mg total) by mouth 2 (two) times daily as needed for cough.  Dispense: 20 capsule; Refill: 0   Signed, [address] [place], [place], [facility] 04/22/2013 12:55 PM

## 2013-04-24 LAB — POCT INR: INR: 2.4

## 2013-04-25 ENCOUNTER — Telehealth: Payer: Self-pay | Admitting: *Deleted

## 2013-04-25 NOTE — Telephone Encounter (Signed)
MD to be made aware.  

## 2013-04-25 NOTE — Telephone Encounter (Signed)
Message copied by Sheral Flow on Thu Apr 25, 2013  9:30 AM ------      Message from: Lenore Manner      Created: Thu Apr 25, 2013  9:02 AM      Regarding: [place]: [place] from [facility] called and stated that the Pt son Edd Arbour Declined them coming out saying that the mother didn't need it right now. If any questions Pamala Hurry said feel free to call her. I let sabrina know for the referral.  ------

## 2013-04-26 NOTE — Telephone Encounter (Signed)
Okay to cancel

## 2013-04-30 ENCOUNTER — Ambulatory Visit (INDEPENDENT_AMBULATORY_CARE_PROVIDER_SITE_OTHER): Payer: Medicare Other | Admitting: Pharmacist Clinician (PhC)/ Clinical Pharmacy Specialist

## 2013-04-30 DIAGNOSIS — I4891 Unspecified atrial fibrillation: Secondary | ICD-10-CM

## 2013-04-30 DIAGNOSIS — I480 Paroxysmal atrial fibrillation: Secondary | ICD-10-CM

## 2013-04-30 DIAGNOSIS — Z7901 Long term (current) use of anticoagulants: Secondary | ICD-10-CM

## 2013-05-02 ENCOUNTER — Ambulatory Visit (INDEPENDENT_AMBULATORY_CARE_PROVIDER_SITE_OTHER): Payer: Medicare Other | Admitting: Pharmacist Clinician (PhC)/ Clinical Pharmacy Specialist

## 2013-05-02 ENCOUNTER — Telehealth: Payer: Self-pay | Admitting: *Deleted

## 2013-05-02 DIAGNOSIS — I480 Paroxysmal atrial fibrillation: Secondary | ICD-10-CM

## 2013-05-02 DIAGNOSIS — Z7901 Long term (current) use of anticoagulants: Secondary | ICD-10-CM

## 2013-05-02 DIAGNOSIS — I4891 Unspecified atrial fibrillation: Secondary | ICD-10-CM

## 2013-05-02 LAB — POCT INR: INR: 5

## 2013-05-02 NOTE — Telephone Encounter (Signed)
Helen [name] has a critical INR

## 2013-05-02 NOTE — Telephone Encounter (Signed)
Call w/ Critical INR 5.0   Message forwarded to Tommy Medal, PharmD.

## 2013-05-09 LAB — POCT INR: INR: 3.7

## 2013-05-10 ENCOUNTER — Ambulatory Visit (INDEPENDENT_AMBULATORY_CARE_PROVIDER_SITE_OTHER): Payer: Medicare Other | Admitting: Pharmacist Clinician (PhC)/ Clinical Pharmacy Specialist

## 2013-05-10 DIAGNOSIS — I4891 Unspecified atrial fibrillation: Secondary | ICD-10-CM

## 2013-05-10 DIAGNOSIS — Z7901 Long term (current) use of anticoagulants: Secondary | ICD-10-CM

## 2013-05-10 DIAGNOSIS — I480 Paroxysmal atrial fibrillation: Secondary | ICD-10-CM

## 2013-05-15 ENCOUNTER — Other Ambulatory Visit: Payer: Self-pay | Admitting: Family Medicine

## 2013-05-15 LAB — POCT INR: INR: 1.9

## 2013-05-16 ENCOUNTER — Telehealth: Payer: Self-pay | Admitting: Cardiovascular Disease

## 2013-05-16 NOTE — Telephone Encounter (Signed)
Refill appropriate and filled per protocol. 

## 2013-05-17 ENCOUNTER — Ambulatory Visit (INDEPENDENT_AMBULATORY_CARE_PROVIDER_SITE_OTHER): Payer: Medicare Other | Admitting: Pharmacist Clinician (PhC)/ Clinical Pharmacy Specialist

## 2013-05-17 DIAGNOSIS — I4891 Unspecified atrial fibrillation: Secondary | ICD-10-CM

## 2013-05-17 DIAGNOSIS — Z7901 Long term (current) use of anticoagulants: Secondary | ICD-10-CM

## 2013-05-17 DIAGNOSIS — I480 Paroxysmal atrial fibrillation: Secondary | ICD-10-CM

## 2013-05-21 LAB — HM DIABETES EYE EXAM

## 2013-05-22 LAB — POCT INR: INR: 1.9

## 2013-05-23 ENCOUNTER — Telehealth: Payer: Self-pay | Admitting: Cardiovascular Disease

## 2013-05-23 NOTE — Telephone Encounter (Signed)
Call received reporting a 1.9 INR result. Report to be given to [facility] for review.

## 2013-05-24 ENCOUNTER — Ambulatory Visit (INDEPENDENT_AMBULATORY_CARE_PROVIDER_SITE_OTHER): Payer: Medicare Other | Admitting: Pharmacist Clinician (PhC)/ Clinical Pharmacy Specialist

## 2013-05-24 DIAGNOSIS — I4891 Unspecified atrial fibrillation: Secondary | ICD-10-CM

## 2013-05-24 DIAGNOSIS — I480 Paroxysmal atrial fibrillation: Secondary | ICD-10-CM

## 2013-05-24 DIAGNOSIS — Z7901 Long term (current) use of anticoagulants: Secondary | ICD-10-CM

## 2013-05-30 ENCOUNTER — Telehealth: Payer: Self-pay | Admitting: Cardiovascular Disease

## 2013-05-30 LAB — POCT INR: INR: 3.3

## 2013-05-30 NOTE — Telephone Encounter (Signed)
Closed encounter °

## 2013-05-30 NOTE — Telephone Encounter (Signed)
INR 3.3 today  Message forwarded to Tommy Medal, PharmD.

## 2013-05-31 ENCOUNTER — Other Ambulatory Visit: Payer: Self-pay | Admitting: *Deleted

## 2013-05-31 ENCOUNTER — Other Ambulatory Visit: Payer: Self-pay | Admitting: Family Medicine

## 2013-05-31 ENCOUNTER — Ambulatory Visit (INDEPENDENT_AMBULATORY_CARE_PROVIDER_SITE_OTHER): Payer: Medicare Other | Admitting: Pharmacist Clinician (PhC)/ Clinical Pharmacy Specialist

## 2013-05-31 DIAGNOSIS — I480 Paroxysmal atrial fibrillation: Secondary | ICD-10-CM

## 2013-05-31 DIAGNOSIS — I4891 Unspecified atrial fibrillation: Secondary | ICD-10-CM

## 2013-05-31 DIAGNOSIS — Z7901 Long term (current) use of anticoagulants: Secondary | ICD-10-CM

## 2013-05-31 NOTE — Telephone Encounter (Signed)
Refill not appropriate.   Patient is on Namenda XR 28mg .

## 2013-06-04 ENCOUNTER — Other Ambulatory Visit: Payer: Self-pay | Admitting: *Deleted

## 2013-06-04 MED ORDER — MEMANTINE HCL ER 28 MG PO CP24: ORAL_CAPSULE | ORAL | Status: DC

## 2013-06-04 NOTE — Telephone Encounter (Signed)
Refill appropriate and filled per protocol. 

## 2013-06-05 LAB — POCT INR: INR: 3.2

## 2013-06-06 ENCOUNTER — Encounter: Payer: Self-pay | Admitting: Family Medicine

## 2013-06-06 ENCOUNTER — Telehealth: Payer: Self-pay | Admitting: Cardiovascular Disease

## 2013-06-06 NOTE — Telephone Encounter (Signed)
MD INR called results from yesterday 3.2.  Info sent to [facility] PharmD.

## 2013-06-07 ENCOUNTER — Ambulatory Visit (INDEPENDENT_AMBULATORY_CARE_PROVIDER_SITE_OTHER): Payer: Medicare Other | Admitting: Pharmacist Clinician (PhC)/ Clinical Pharmacy Specialist

## 2013-06-07 DIAGNOSIS — I4891 Unspecified atrial fibrillation: Secondary | ICD-10-CM

## 2013-06-07 DIAGNOSIS — I480 Paroxysmal atrial fibrillation: Secondary | ICD-10-CM

## 2013-06-07 DIAGNOSIS — Z7901 Long term (current) use of anticoagulants: Secondary | ICD-10-CM

## 2013-06-10 ENCOUNTER — Other Ambulatory Visit: Payer: Self-pay | Admitting: Family Medicine

## 2013-06-11 NOTE — Telephone Encounter (Signed)
Refill appropriate and filled per protocol. 

## 2013-06-12 LAB — POCT INR: INR: 3.8

## 2013-06-13 ENCOUNTER — Telehealth: Payer: Self-pay | Admitting: *Deleted

## 2013-06-13 NOTE — Telephone Encounter (Signed)
Helen [name] was calling in regards to [name]'s out of range inr INR 3.8 taken on 06/12/13. Any medications changes are to be told to the patient. That is what Helen [name] told me. She said to only call them back if we have a question.  [place]

## 2013-06-13 NOTE — Telephone Encounter (Signed)
Message forwarded to [facility], pharmacist to advise

## 2013-06-14 ENCOUNTER — Ambulatory Visit (INDEPENDENT_AMBULATORY_CARE_PROVIDER_SITE_OTHER): Payer: Medicare Other | Admitting: Pharmacist Clinician (PhC)/ Clinical Pharmacy Specialist

## 2013-06-14 DIAGNOSIS — I480 Paroxysmal atrial fibrillation: Secondary | ICD-10-CM

## 2013-06-14 DIAGNOSIS — I4891 Unspecified atrial fibrillation: Secondary | ICD-10-CM

## 2013-06-14 DIAGNOSIS — Z7901 Long term (current) use of anticoagulants: Secondary | ICD-10-CM

## 2013-06-20 LAB — POCT INR: INR: 2.2

## 2013-06-25 ENCOUNTER — Ambulatory Visit (INDEPENDENT_AMBULATORY_CARE_PROVIDER_SITE_OTHER): Payer: Medicare Other | Admitting: Pharmacist Clinician (PhC)/ Clinical Pharmacy Specialist

## 2013-06-25 DIAGNOSIS — Z7901 Long term (current) use of anticoagulants: Secondary | ICD-10-CM

## 2013-06-25 DIAGNOSIS — I480 Paroxysmal atrial fibrillation: Secondary | ICD-10-CM

## 2013-06-25 DIAGNOSIS — I4891 Unspecified atrial fibrillation: Secondary | ICD-10-CM

## 2013-06-27 LAB — POCT INR: INR: 2.8

## 2013-06-28 ENCOUNTER — Ambulatory Visit (INDEPENDENT_AMBULATORY_CARE_PROVIDER_SITE_OTHER): Payer: Medicare Other | Admitting: Pharmacist Clinician (PhC)/ Clinical Pharmacy Specialist

## 2013-06-28 DIAGNOSIS — I4891 Unspecified atrial fibrillation: Secondary | ICD-10-CM

## 2013-06-28 DIAGNOSIS — I480 Paroxysmal atrial fibrillation: Secondary | ICD-10-CM

## 2013-06-28 DIAGNOSIS — Z7901 Long term (current) use of anticoagulants: Secondary | ICD-10-CM

## 2013-07-04 LAB — POCT INR: INR: 2.1

## 2013-07-05 ENCOUNTER — Ambulatory Visit (INDEPENDENT_AMBULATORY_CARE_PROVIDER_SITE_OTHER): Payer: Medicare Other | Admitting: Pharmacist Clinician (PhC)/ Clinical Pharmacy Specialist

## 2013-07-05 DIAGNOSIS — Z7901 Long term (current) use of anticoagulants: Secondary | ICD-10-CM

## 2013-07-05 DIAGNOSIS — I480 Paroxysmal atrial fibrillation: Secondary | ICD-10-CM

## 2013-07-05 DIAGNOSIS — I4891 Unspecified atrial fibrillation: Secondary | ICD-10-CM

## 2013-07-10 LAB — POCT INR: INR: 2.8

## 2013-07-11 ENCOUNTER — Ambulatory Visit (INDEPENDENT_AMBULATORY_CARE_PROVIDER_SITE_OTHER): Payer: Medicare Other | Admitting: Pharmacist Clinician (PhC)/ Clinical Pharmacy Specialist

## 2013-07-11 DIAGNOSIS — I4891 Unspecified atrial fibrillation: Secondary | ICD-10-CM

## 2013-07-11 DIAGNOSIS — I480 Paroxysmal atrial fibrillation: Secondary | ICD-10-CM

## 2013-07-11 DIAGNOSIS — Z7901 Long term (current) use of anticoagulants: Secondary | ICD-10-CM

## 2013-07-15 ENCOUNTER — Encounter: Payer: Self-pay | Admitting: Family Medicine

## 2013-07-15 ENCOUNTER — Ambulatory Visit (INDEPENDENT_AMBULATORY_CARE_PROVIDER_SITE_OTHER): Payer: Medicare Other | Admitting: Family Medicine

## 2013-07-15 VITALS — BP 120/78 | HR 76 | Temp 97.7°F | Resp 14 | Wt 177.0 lb

## 2013-07-15 DIAGNOSIS — E785 Hyperlipidemia, unspecified: Secondary | ICD-10-CM

## 2013-07-15 DIAGNOSIS — F0390 Unspecified dementia without behavioral disturbance: Secondary | ICD-10-CM

## 2013-07-15 DIAGNOSIS — E119 Type 2 diabetes mellitus without complications: Secondary | ICD-10-CM

## 2013-07-15 DIAGNOSIS — I10 Essential (primary) hypertension: Secondary | ICD-10-CM

## 2013-07-15 DIAGNOSIS — N3941 Urge incontinence: Secondary | ICD-10-CM

## 2013-07-15 NOTE — Patient Instructions (Signed)
We will call with lab results We will check into Namenda F/U 4 months

## 2013-07-16 ENCOUNTER — Ambulatory Visit: Payer: Medicare Other | Admitting: Family Medicine

## 2013-07-16 LAB — COMPREHENSIVE METABOLIC PANEL
ALBUMIN: 3.9 g/dL (ref 3.5–5.2)
ALK PHOS: 74 U/L (ref 39–117)
ALT: 25 U/L (ref 0–35)
AST: 30 U/L (ref 0–37)
BUN: 13 mg/dL (ref 6–23)
CALCIUM: 9.3 mg/dL (ref 8.4–10.5)
CO2: 22 mEq/L (ref 19–32)
Chloride: 105 mEq/L (ref 96–112)
Creat: 0.85 mg/dL (ref 0.50–1.10)
GLUCOSE: 121 mg/dL — AB (ref 70–99)
POTASSIUM: 4.5 meq/L (ref 3.5–5.3)
Sodium: 138 mEq/L (ref 135–145)
Total Bilirubin: 0.3 mg/dL (ref 0.2–1.2)
Total Protein: 6.3 g/dL (ref 6.0–8.3)

## 2013-07-16 LAB — CBC WITH DIFFERENTIAL/PLATELET
Basophils Absolute: 0 10*3/uL (ref 0.0–0.1)
Basophils Relative: 0 % (ref 0–1)
Eosinophils Absolute: 0.1 10*3/uL (ref 0.0–0.7)
Eosinophils Relative: 1 % (ref 0–5)
HCT: 35.4 % — ABNORMAL LOW (ref 36.0–46.0)
HEMOGLOBIN: 12.1 g/dL (ref 12.0–15.0)
Lymphocytes Relative: 38 % (ref 12–46)
Lymphs Abs: 2.1 10*3/uL (ref 0.7–4.0)
MCH: 30.5 pg (ref 26.0–34.0)
MCHC: 34.2 g/dL (ref 30.0–36.0)
MCV: 89.2 fL (ref 78.0–100.0)
Monocytes Absolute: 0.3 10*3/uL (ref 0.1–1.0)
Monocytes Relative: 6 % (ref 3–12)
NEUTROS ABS: 3.1 10*3/uL (ref 1.7–7.7)
Neutrophils Relative %: 55 % (ref 43–77)
Platelets: 193 10*3/uL (ref 150–400)
RBC: 3.97 MIL/uL (ref 3.87–5.11)
RDW: 14.7 % (ref 11.5–15.5)
WBC: 5.6 10*3/uL (ref 4.0–10.5)

## 2013-07-16 LAB — HEMOGLOBIN A1C
Hgb A1c MFr Bld: 7.4 % — ABNORMAL HIGH (ref <5.7)
Mean Plasma Glucose: 166 mg/dL — ABNORMAL HIGH (ref <117)

## 2013-07-16 LAB — LIPID PANEL
Cholesterol: 195 mg/dL (ref 0–200)
HDL: 41 mg/dL (ref >39)
LDL Cholesterol: 114 mg/dL — ABNORMAL HIGH (ref 0–99)
TRIGLYCERIDES: 202 mg/dL — AB (ref <150)
Total CHOL/HDL Ratio: 4.8 Ratio
VLDL: 40 mg/dL (ref 0–40)

## 2013-07-16 MED ORDER — MEMANTINE HCL 10 MG PO TABS: 10.0000 mg | ORAL_TABLET | Freq: Two times a day (BID) | ORAL | Status: AC

## 2013-07-16 NOTE — Assessment & Plan Note (Signed)
Well controlled 

## 2013-07-16 NOTE — Assessment & Plan Note (Signed)
Followed by urology, given samples of Mirabegron 50mg  today

## 2013-07-16 NOTE — Progress Notes (Signed)
Patient ID: Helen [name], female   DOB: February 11, 1924, 80 y.o.   MRN: [id number]   Subjective:    Patient ID: Helen [name], female    DOB: 24-May-1924, 80 y.o.   MRN: [id number]  Patient presents for 4 month F/U and Medication review  patient here follow chronic medical problems. He has no specific concerns. She was seen by her urology and had some changes made. Her antibiotic was decreased. She was also taken off of Toviaz, she cannot see very much difference with this medication. She still on the [facility] but has not noticed that this is making a difference either. She's had some difficulty getting her [place] is causing her $150 a month. Her son thinks is because she is in the donut hole but is not quite sure. He wants to go back on the twice a day dosing as it was cheaper for her. Diabetes mellitus her blood sugars have been well-controlled we'll she is due for repeat A1c. There is no longer today. Her blood pressures have been well controlled at home with her current medication she's not had any fall no dizziness no headache. Good appetite   Review Of Systems:  GEN- denies fatigue, fever, weight loss,weakness, recent illness HEENT- denies eye drainage, change in vision, nasal discharge, CVS- denies chest pain, palpitations RESP- denies SOB, cough, wheeze ABD- denies N/V, change in stools, abd pain GU- denies dysuria, hematuria, dribbling, incontinence MSK- denies joint pain, muscle aches, injury Neuro- denies headache, dizziness, syncope, seizure activity       Objective:    BP 120/78  Pulse 76  Temp(Src) 97.7 F (36.5 C) (Oral)  Resp 14  Wt 177 lb (80.287 kg) GEN- NAD, alert and oriented x3 HEENT- PERRL, EOMI, non injected sclera, pink conjunctiva, MMM, oropharynx clear CVS- RRR, no murmur RESP-CTAB ABD-NABS,soft,NT,ND Psych- very pleasant, oriented to person/place  EXT- No edema Skin- in tact Pulses- Radial, DP- 2+        Assessment & Plan:      Problem List  Items Addressed This Visit   Hyperlipidemia   Relevant Orders      Hemoglobin A1c (Completed)   HTN (hypertension) (Chronic)   DM type 2 (diabetes mellitus, type 2) (Chronic)   Relevant Orders      CBC with Differential (Completed)      Comprehensive metabolic panel (Completed)      Lipid panel (Completed)   Dementia - Primary (Chronic)      Note: This dictation was prepared with Dragon dictation along with smaller phrase technology. Any transcriptional errors that result from this process are unintentional.

## 2013-07-16 NOTE — Assessment & Plan Note (Signed)
Doing well on medications, will have nurse check with pharmacy to see what issue for cost is Will try BID dosing at family request to see if this is cheaper Given samples of Namenda 28MG  XR today - 15 PILLS

## 2013-07-16 NOTE — Assessment & Plan Note (Signed)
Check labs, she is non fasting, goal A1C < 8% based on age

## 2013-07-18 LAB — POCT INR: INR: 2.5

## 2013-07-22 ENCOUNTER — Other Ambulatory Visit: Payer: Self-pay | Admitting: *Deleted

## 2013-07-22 MED ORDER — ATORVASTATIN CALCIUM 20 MG PO TABS: 20.0000 mg | ORAL_TABLET | Freq: Every day | ORAL | Status: AC

## 2013-07-25 ENCOUNTER — Ambulatory Visit (INDEPENDENT_AMBULATORY_CARE_PROVIDER_SITE_OTHER): Payer: Medicare Other | Admitting: Pharmacist Clinician (PhC)/ Clinical Pharmacy Specialist

## 2013-07-25 DIAGNOSIS — I4891 Unspecified atrial fibrillation: Secondary | ICD-10-CM

## 2013-07-25 DIAGNOSIS — I480 Paroxysmal atrial fibrillation: Secondary | ICD-10-CM

## 2013-07-25 DIAGNOSIS — Z7901 Long term (current) use of anticoagulants: Secondary | ICD-10-CM

## 2013-07-31 LAB — POCT INR: INR: 2.4

## 2013-08-02 ENCOUNTER — Ambulatory Visit (INDEPENDENT_AMBULATORY_CARE_PROVIDER_SITE_OTHER): Payer: Medicare Other | Admitting: Pharmacist Clinician (PhC)/ Clinical Pharmacy Specialist

## 2013-08-02 DIAGNOSIS — I4891 Unspecified atrial fibrillation: Secondary | ICD-10-CM

## 2013-08-02 DIAGNOSIS — I480 Paroxysmal atrial fibrillation: Secondary | ICD-10-CM

## 2013-08-02 DIAGNOSIS — Z7901 Long term (current) use of anticoagulants: Secondary | ICD-10-CM

## 2013-08-08 ENCOUNTER — Other Ambulatory Visit: Payer: Self-pay | Admitting: Pharmacist Clinician (PhC)/ Clinical Pharmacy Specialist

## 2013-08-08 ENCOUNTER — Telehealth: Payer: Self-pay | Admitting: Cardiovascular Disease

## 2013-08-08 LAB — POCT INR: INR: 3.8

## 2013-08-08 NOTE — Telephone Encounter (Signed)
Patient INR TODAY IS 3.8  WILL DEFER TO KRISTIN

## 2013-08-08 NOTE — Telephone Encounter (Signed)
PER KRISTIN PATIENT SON MANAGES MEDICATION

## 2013-08-12 ENCOUNTER — Ambulatory Visit (INDEPENDENT_AMBULATORY_CARE_PROVIDER_SITE_OTHER): Payer: Medicare Other | Admitting: Pharmacist

## 2013-08-12 DIAGNOSIS — I480 Paroxysmal atrial fibrillation: Secondary | ICD-10-CM

## 2013-08-12 DIAGNOSIS — Z7901 Long term (current) use of anticoagulants: Secondary | ICD-10-CM

## 2013-08-12 DIAGNOSIS — I4891 Unspecified atrial fibrillation: Secondary | ICD-10-CM

## 2013-08-12 NOTE — Telephone Encounter (Signed)
Spoke with pt's son.  See anticoag note for details.

## 2013-08-13 ENCOUNTER — Encounter: Payer: Self-pay | Admitting: Family Medicine

## 2013-08-13 ENCOUNTER — Other Ambulatory Visit: Payer: Self-pay | Admitting: Family Medicine

## 2013-08-13 ENCOUNTER — Ambulatory Visit (HOSPITAL_COMMUNITY)
Admission: RE | Admit: 2013-08-13 | Discharge: 2013-08-13 | Disposition: A | Discharge status: Home / Self Care | Payer: Medicare Other | Source: Ambulatory Visit | Attending: Family Medicine | Admitting: Family Medicine

## 2013-08-13 ENCOUNTER — Ambulatory Visit (INDEPENDENT_AMBULATORY_CARE_PROVIDER_SITE_OTHER): Payer: Medicare Other | Admitting: Family Medicine

## 2013-08-13 VITALS — BP 128/64 | HR 78 | Temp 99.2°F | Resp 16 | Wt 195.0 lb

## 2013-08-13 DIAGNOSIS — J181 Lobar pneumonia, unspecified organism: Principal | ICD-10-CM

## 2013-08-13 DIAGNOSIS — J189 Pneumonia, unspecified organism: Secondary | ICD-10-CM

## 2013-08-13 MED ORDER — CEFUROXIME AXETIL 500 MG PO TABS: 500.0000 mg | ORAL_TABLET | Freq: Two times a day (BID) | ORAL | Status: DC

## 2013-08-13 MED ORDER — AZITHROMYCIN 250 MG PO TABS: ORAL_TABLET | ORAL | Status: DC

## 2013-08-13 NOTE — Progress Notes (Signed)
Subjective:    Patient ID: Helen [name], female    DOB: 1924/05/12, 80 y.o.   MRN: [id number]  HPI Patient has had 48 hours of progressively worsening cough. It is productive of brown sputum. She reports substernal chest pressure. She reports right-sided pleurisy particularly in her right lower posterior lobe. She reports increasing shortness of breath. She reports subjective fevers. She denies any rhinorrhea or sore throat. She denies any nausea vomiting or diarrhea.  Physical exam today is significant for right basilar crackles and rales and occasional expiratory wheezes. Past Medical History  Diagnosis Date  . Stroke 02/2011    large right frontal lobe hemispheric stroke  . Atrial fibrillation   . Diabetes mellitus   . Hypertension   . Coronary artery disease   . Hyperlipidemia 05/25/2011  . Anemia 05/25/2011  . Seizures     Noted at [facility] in Jan 2012, started on Keppra  . Depression   . Arthritis   . TIA (transient ischemic attack) 05/24/2011    2D Echo EF 55%-60%  . Vertigo    Current Outpatient Prescriptions on File Prior to Visit  Medication Sig Dispense Refill  . acetaminophen (TYLENOL) 500 MG tablet Take 500 mg by mouth 2 (two) times daily as needed for pain.      Marland Kitchen atorvastatin (LIPITOR) 20 MG tablet Take 1 tablet (20 mg total) by mouth daily.  90 tablet  3  . benzonatate (TESSALON) 200 MG capsule Take 1 capsule (200 mg total) by mouth 2 (two) times daily as needed for cough.  20 capsule  0  . clotrimazole (LOTRIMIN) 1 % cream Apply 1 application topically 2 (two) times daily.  30 g  0  . donepezil (ARICEPT) 10 MG tablet TAKE 1 TABLET BY MOUTH EVERY DAY  30 tablet  6  . escitalopram (LEXAPRO) 5 MG tablet TAKE 1 TABLET BY MOUTH EVERY MORNING  30 tablet  6  . folic acid (FOLVITE) 1 MG tablet Take 1 mg by mouth every morning. At 700      . glipiZIDE (GLUCOTROL) 5 MG tablet TAKE 1 TABLET BY MOUTH EVERY DAY  30 tablet  6  . levETIRAcetam (KEPPRA) 500 MG tablet TAKE 1 TABLET  BY MOUTH TWICE DAILY  60 tablet  6  . lisinopril (PRINIVIL,ZESTRIL) 10 MG tablet TAKE 1 TABLET BY MOUTH EVERY DAY  30 tablet  6  . memantine (NAMENDA) 10 MG tablet Take 1 tablet (10 mg total) by mouth 2 (two) times daily.  60 tablet  6  . Memantine HCl ER (NAMENDA XR) 28 MG CP24 1 tablet daily for dementia  30 capsule  6  . metoprolol tartrate (LOPRESSOR) 25 MG tablet TAKE 1/2 TABLET BY MOUTH TWICE DAILY  60 tablet  3  . mirabegron ER (MYRBETRIQ) 50 MG TB24 Take 50 mg by mouth at bedtime.       . Multiple Vitamins-Minerals (CENTRUM SILVER ULTRA WOMENS PO) Take 1 tablet by mouth every morning. At 700      . potassium chloride (K-DUR) 10 MEQ tablet Take 20 mEq by mouth daily.       . protective barrier (RESTORE) CREA Apply 1 application topically 3 (three) times a week.       . warfarin (COUMADIN) 4 MG tablet Take 4-8 mg by mouth See admin instructions. Takes 4mg  on Monday, Wednesday and Friday.  And 8mg  all other days.      Marland Kitchen warfarin (COUMADIN) 4 MG tablet TAKE 1 TO 2 TABLETS BY MOUTH  DAILY AS DIRECTED  60 tablet  2  . zolpidem (AMBIEN) 5 MG tablet Take 5 mg by mouth at bedtime as needed for sleep.       No current facility-administered medications on file prior to visit.   Allergies  Allergen Reactions  . Ciprofloxacin     Patient states that she may have had a stroke due to this medication  . Niacin And Related Hives  . Statins Other (See Comments)    Some but not all    History   Social History  . Marital Status: Widowed    Spouse Name: N/A    Number of Children: 1  . Years of Education: N/A   Occupational History  . retired, Press photographer    Social History Main Topics  . Smoking status: Never Smoker   . Smokeless tobacco: Not on file  . Alcohol Use: No  . Drug Use: No  . Sexual Activity: Yes    Birth Control/ Protection: Surgical   Other Topics Concern  . Not on file   Social History Narrative   Lives w/ 1 son   Lost 1 son MI   Caregiver at night & CN 4 hrs during day             Review of Systems  All other systems reviewed and are negative.      Objective:   Physical Exam  Constitutional: She appears well-developed and well-nourished.  HENT:  Right Ear: External ear normal.  Left Ear: External ear normal.  Nose: Nose normal.  Mouth/Throat: Oropharynx is clear and moist. No oropharyngeal exudate.  Neck: Neck supple.  Cardiovascular: Normal rate and normal heart sounds.  An irregularly irregular rhythm present.  Pulmonary/Chest: Effort normal. No respiratory distress. She has wheezes. She has rales.  Abdominal: Soft. Bowel sounds are normal.  Lymphadenopathy:    She has no cervical adenopathy.          Assessment & Plan:  1. RLL pneumonia History and physical exam are concerning for right lower 11 pneumonia. Also the patient for a chest x-ray. Meanwhile begin Ceftin 500 mg by mouth twice a day for 10 days to cover Streptococcus.  Also prescribed Zithromax to cover for atypicals. Recheck in 48 hours or sooner if worse. - azithromycin (ZITHROMAX) 250 MG tablet; 2 tabs poqday1, 1 tab poqday 2-5  Dispense: 6 tablet; Refill: 0 - cefUROXime (CEFTIN) 500 MG tablet; Take 1 tablet (500 mg total) by mouth 2 (two) times daily with a meal.  Dispense: 20 tablet; Refill: 0 - DG Chest 2 View; Future

## 2013-08-14 ENCOUNTER — Emergency Department (HOSPITAL_COMMUNITY): Payer: Medicare Other

## 2013-08-14 ENCOUNTER — Encounter (HOSPITAL_COMMUNITY): Payer: Self-pay | Admitting: Emergency Medicine

## 2013-08-14 ENCOUNTER — Inpatient Hospital Stay (HOSPITAL_COMMUNITY)
Admission: EM | Admit: 2013-08-14 | Discharge: 2013-08-15 | DRG: 195 | Disposition: A | Discharge status: Home Health | Payer: Medicare Other | Attending: Family Medicine | Admitting: Family Medicine

## 2013-08-14 DIAGNOSIS — M67919 Unspecified disorder of synovium and tendon, unspecified shoulder: Secondary | ICD-10-CM

## 2013-08-14 DIAGNOSIS — Z993 Dependence on wheelchair: Secondary | ICD-10-CM

## 2013-08-14 DIAGNOSIS — N3941 Urge incontinence: Secondary | ICD-10-CM

## 2013-08-14 DIAGNOSIS — I480 Paroxysmal atrial fibrillation: Secondary | ICD-10-CM | POA: Diagnosis present

## 2013-08-14 DIAGNOSIS — Z8249 Family history of ischemic heart disease and other diseases of the circulatory system: Secondary | ICD-10-CM

## 2013-08-14 DIAGNOSIS — I639 Cerebral infarction, unspecified: Secondary | ICD-10-CM

## 2013-08-14 DIAGNOSIS — E785 Hyperlipidemia, unspecified: Secondary | ICD-10-CM | POA: Diagnosis present

## 2013-08-14 DIAGNOSIS — R197 Diarrhea, unspecified: Secondary | ICD-10-CM

## 2013-08-14 DIAGNOSIS — M719 Bursopathy, unspecified: Secondary | ICD-10-CM

## 2013-08-14 DIAGNOSIS — Z7901 Long term (current) use of anticoagulants: Secondary | ICD-10-CM

## 2013-08-14 DIAGNOSIS — F0390 Unspecified dementia without behavioral disturbance: Secondary | ICD-10-CM | POA: Diagnosis present

## 2013-08-14 DIAGNOSIS — G40909 Epilepsy, unspecified, not intractable, without status epilepticus: Secondary | ICD-10-CM

## 2013-08-14 DIAGNOSIS — R112 Nausea with vomiting, unspecified: Secondary | ICD-10-CM | POA: Diagnosis present

## 2013-08-14 DIAGNOSIS — I2510 Atherosclerotic heart disease of native coronary artery without angina pectoris: Secondary | ICD-10-CM | POA: Diagnosis present

## 2013-08-14 DIAGNOSIS — M159 Polyosteoarthritis, unspecified: Secondary | ICD-10-CM

## 2013-08-14 DIAGNOSIS — I4891 Unspecified atrial fibrillation: Secondary | ICD-10-CM | POA: Diagnosis present

## 2013-08-14 DIAGNOSIS — I10 Essential (primary) hypertension: Secondary | ICD-10-CM | POA: Diagnosis present

## 2013-08-14 DIAGNOSIS — D649 Anemia, unspecified: Secondary | ICD-10-CM | POA: Diagnosis present

## 2013-08-14 DIAGNOSIS — B372 Candidiasis of skin and nail: Secondary | ICD-10-CM

## 2013-08-14 DIAGNOSIS — Z8673 Personal history of transient ischemic attack (TIA), and cerebral infarction without residual deficits: Secondary | ICD-10-CM

## 2013-08-14 DIAGNOSIS — E119 Type 2 diabetes mellitus without complications: Secondary | ICD-10-CM | POA: Diagnosis present

## 2013-08-14 DIAGNOSIS — Z79899 Other long term (current) drug therapy: Secondary | ICD-10-CM

## 2013-08-14 DIAGNOSIS — J189 Pneumonia, unspecified organism: Principal | ICD-10-CM | POA: Diagnosis present

## 2013-08-14 DIAGNOSIS — Z7409 Other reduced mobility: Secondary | ICD-10-CM

## 2013-08-14 DIAGNOSIS — L8991 Pressure ulcer of unspecified site, stage 1: Secondary | ICD-10-CM

## 2013-08-14 HISTORY — DX: Unspecified dementia, unspecified severity, without behavioral disturbance, psychotic disturbance, mood disturbance, and anxiety: F03.90

## 2013-08-14 LAB — CBC WITH DIFFERENTIAL/PLATELET
BASOS ABS: 0 10*3/uL (ref 0.0–0.1)
Basophils Relative: 0 % (ref 0–1)
EOS ABS: 0.1 10*3/uL (ref 0.0–0.7)
EOS PCT: 1 % (ref 0–5)
HEMATOCRIT: 39.4 % (ref 36.0–46.0)
Hemoglobin: 13.4 g/dL (ref 12.0–15.0)
Lymphocytes Relative: 12 % (ref 12–46)
Lymphs Abs: 1.1 10*3/uL (ref 0.7–4.0)
MCH: 31.1 pg (ref 26.0–34.0)
MCHC: 34 g/dL (ref 30.0–36.0)
MCV: 91.4 fL (ref 78.0–100.0)
MONO ABS: 0.4 10*3/uL (ref 0.1–1.0)
Monocytes Relative: 4 % (ref 3–12)
Neutro Abs: 7.4 10*3/uL (ref 1.7–7.7)
Neutrophils Relative %: 83 % — ABNORMAL HIGH (ref 43–77)
Platelets: 204 10*3/uL (ref 150–400)
RBC: 4.31 MIL/uL (ref 3.87–5.11)
RDW: 13.5 % (ref 11.5–15.5)
WBC: 9 10*3/uL (ref 4.0–10.5)

## 2013-08-14 LAB — POC OCCULT BLOOD, ED: Fecal Occult Bld: POSITIVE — AB

## 2013-08-14 LAB — TROPONIN I: Troponin I: <0.3 ng/mL (ref <0.30)

## 2013-08-14 LAB — GLUCOSE, CAPILLARY
Glucose-Capillary: 132 mg/dL — ABNORMAL HIGH (ref 70–99)
Glucose-Capillary: 165 mg/dL — ABNORMAL HIGH (ref 70–99)

## 2013-08-14 LAB — COMPREHENSIVE METABOLIC PANEL
ALT: 29 U/L (ref 0–35)
AST: 34 U/L (ref 0–37)
Albumin: 3.6 g/dL (ref 3.5–5.2)
Alkaline Phosphatase: 111 U/L (ref 39–117)
Anion gap: 17 — ABNORMAL HIGH (ref 5–15)
BILIRUBIN TOTAL: 0.4 mg/dL (ref 0.3–1.2)
BUN: 11 mg/dL (ref 6–23)
CHLORIDE: 99 meq/L (ref 96–112)
CO2: 21 meq/L (ref 19–32)
CREATININE: 0.89 mg/dL (ref 0.50–1.10)
Calcium: 9.5 mg/dL (ref 8.4–10.5)
GFR calc Af Amer: 66 mL/min — ABNORMAL LOW (ref >90)
GFR calc non Af Amer: 57 mL/min — ABNORMAL LOW (ref >90)
Glucose, Bld: 235 mg/dL — ABNORMAL HIGH (ref 70–99)
Potassium: 4.2 mEq/L (ref 3.7–5.3)
Sodium: 137 mEq/L (ref 137–147)
Total Protein: 7.4 g/dL (ref 6.0–8.3)

## 2013-08-14 LAB — LACTIC ACID, PLASMA: Lactic Acid, Venous: 2.9 mmol/L — ABNORMAL HIGH (ref 0.5–2.2)

## 2013-08-14 LAB — HEMOGLOBIN A1C
HEMOGLOBIN A1C: 7.2 % — AB (ref <5.7)
Mean Plasma Glucose: 160 mg/dL — ABNORMAL HIGH (ref <117)

## 2013-08-14 LAB — TSH: TSH: 1.93 u[IU]/mL (ref 0.350–4.500)

## 2013-08-14 LAB — PROTIME-INR
INR: 2.12 — AB (ref 0.00–1.49)
Prothrombin Time: 23.7 seconds — ABNORMAL HIGH (ref 11.6–15.2)

## 2013-08-14 LAB — CLOSTRIDIUM DIFFICILE BY PCR: Toxigenic C. Difficile by PCR: NEGATIVE

## 2013-08-14 LAB — LIPASE, BLOOD: Lipase: 31 U/L (ref 11–59)

## 2013-08-14 MED ORDER — LEVETIRACETAM 500 MG PO TABS
500.0000 mg | ORAL_TABLET | Freq: Two times a day (BID) | ORAL | Status: DC
  Administered 2013-08-14 – 2013-08-15 (×2): 500 mg via ORAL
  Filled 2013-08-14 (×2): qty 1

## 2013-08-14 MED ORDER — ACETAMINOPHEN 325 MG PO TABS
650.0000 mg | ORAL_TABLET | Freq: Four times a day (QID) | ORAL | Status: DC | PRN
Start: 2013-08-14 — End: 2013-08-15

## 2013-08-14 MED ORDER — SODIUM CHLORIDE 0.9 % IV SOLN
INTRAVENOUS | Status: DC
  Administered 2013-08-14: 12:00:00 via INTRAVENOUS

## 2013-08-14 MED ORDER — METOPROLOL TARTRATE 25 MG PO TABS
12.5000 mg | ORAL_TABLET | Freq: Two times a day (BID) | ORAL | Status: DC
  Administered 2013-08-14 – 2013-08-15 (×2): 12.5 mg via ORAL
  Filled 2013-08-14 (×2): qty 1

## 2013-08-14 MED ORDER — ACETAMINOPHEN 650 MG RE SUPP
650.0000 mg | Freq: Four times a day (QID) | RECTAL | Status: DC | PRN
Start: 2013-08-14 — End: 2013-08-15

## 2013-08-14 MED ORDER — DEXTROSE 5 % IV SOLN
1.0000 g | Freq: Once | INTRAVENOUS | Status: AC
  Administered 2013-08-14: 1 g via INTRAVENOUS
  Filled 2013-08-14: qty 10

## 2013-08-14 MED ORDER — MIRABEGRON ER 25 MG PO TB24
50.0000 mg | ORAL_TABLET | Freq: Every day | ORAL | Status: DC
  Administered 2013-08-14: 50 mg via ORAL
  Filled 2013-08-14 (×2): qty 2

## 2013-08-14 MED ORDER — WARFARIN SODIUM 2 MG PO TABS
4.0000 mg | ORAL_TABLET | Freq: Once | ORAL | Status: AC
  Administered 2013-08-14: 4 mg via ORAL
  Filled 2013-08-14: qty 2

## 2013-08-14 MED ORDER — ONDANSETRON HCL 4 MG/2ML IJ SOLN: 4.0000 mg | Freq: Four times a day (QID) | INTRAMUSCULAR | Status: DC | PRN

## 2013-08-14 MED ORDER — MORPHINE SULFATE 2 MG/ML IJ SOLN: 1.0000 mg | INTRAMUSCULAR | Status: DC | PRN

## 2013-08-14 MED ORDER — MEMANTINE HCL 10 MG PO TABS
10.0000 mg | ORAL_TABLET | Freq: Two times a day (BID) | ORAL | Status: DC
  Administered 2013-08-14 – 2013-08-15 (×2): 10 mg via ORAL
  Filled 2013-08-14 (×3): qty 1

## 2013-08-14 MED ORDER — INSULIN ASPART 100 UNIT/ML ~~LOC~~ SOLN: 0.0000 [IU] | Freq: Every day | SUBCUTANEOUS | Status: DC

## 2013-08-14 MED ORDER — DEXTROSE 5 % IV SOLN
500.0000 mg | INTRAVENOUS | Status: DC
  Administered 2013-08-14 – 2013-08-15 (×2): 500 mg via INTRAVENOUS
  Filled 2013-08-14 (×2): qty 500

## 2013-08-14 MED ORDER — ATORVASTATIN CALCIUM 20 MG PO TABS
20.0000 mg | ORAL_TABLET | Freq: Every evening | ORAL | Status: DC
  Administered 2013-08-14: 20 mg via ORAL
  Filled 2013-08-14: qty 1

## 2013-08-14 MED ORDER — HYDROCODONE-ACETAMINOPHEN 5-325 MG PO TABS: 1.0000 | ORAL_TABLET | ORAL | Status: DC | PRN

## 2013-08-14 MED ORDER — CITALOPRAM HYDROBROMIDE 20 MG PO TABS
20.0000 mg | ORAL_TABLET | Freq: Every day | ORAL | Status: DC
  Administered 2013-08-15: 20 mg via ORAL
  Filled 2013-08-14: qty 1

## 2013-08-14 MED ORDER — DEXTROSE 5 % IV SOLN: 500.0000 mg | Freq: Once | INTRAVENOUS | Status: DC

## 2013-08-14 MED ORDER — LISINOPRIL 10 MG PO TABS
10.0000 mg | ORAL_TABLET | Freq: Every day | ORAL | Status: DC
  Administered 2013-08-15: 10 mg via ORAL
  Filled 2013-08-14: qty 1

## 2013-08-14 MED ORDER — INSULIN ASPART 100 UNIT/ML ~~LOC~~ SOLN
0.0000 [IU] | Freq: Three times a day (TID) | SUBCUTANEOUS | Status: DC
  Administered 2013-08-14: 2 [IU] via SUBCUTANEOUS
  Administered 2013-08-15: 5 [IU] via SUBCUTANEOUS
  Administered 2013-08-15: 3 [IU] via SUBCUTANEOUS

## 2013-08-14 MED ORDER — SODIUM CHLORIDE 0.9 % IV SOLN: INTRAVENOUS | Status: DC

## 2013-08-14 MED ORDER — ZOLPIDEM TARTRATE 5 MG PO TABS: 5.0000 mg | ORAL_TABLET | Freq: Every evening | ORAL | Status: DC | PRN

## 2013-08-14 MED ORDER — ALUM & MAG HYDROXIDE-SIMETH 200-200-20 MG/5ML PO SUSP: 30.0000 mL | Freq: Four times a day (QID) | ORAL | Status: DC | PRN

## 2013-08-14 MED ORDER — DEXTROSE 5 % IV SOLN
1.0000 g | INTRAVENOUS | Status: DC
  Administered 2013-08-15: 1 g via INTRAVENOUS
  Filled 2013-08-14 (×2): qty 10

## 2013-08-14 MED ORDER — WARFARIN - PHARMACIST DOSING INPATIENT
Status: DC
  Administered 2013-08-14: 16:00:00

## 2013-08-14 MED ORDER — ONDANSETRON HCL 4 MG PO TABS: 4.0000 mg | ORAL_TABLET | Freq: Four times a day (QID) | ORAL | Status: DC | PRN

## 2013-08-14 MED ORDER — DONEPEZIL HCL 5 MG PO TABS
10.0000 mg | ORAL_TABLET | Freq: Every day | ORAL | Status: DC
  Administered 2013-08-14: 10 mg via ORAL
  Filled 2013-08-14: qty 2

## 2013-08-14 NOTE — H&P (Signed)
[facility] History and Physical  Helen [name] MWU:[id number] DOB: Mar 19, 1924 DOA: 08/14/2013  Referring physician:  PCP: Vic Blackbird, MD   Chief Complaint: generalized weakness/diarrhea  HPI: Helen [name] is a 80 y.o. female with a past medical history that includes diabetes, A. fib, stroke, seizures, dementia presents to the emergency department with the chief complaint of diarrhea and persistent cough. She was seen by her PCP yesterday for a four-day history of worsening cough and generalized weakness. She was treated with antibiotics for presumed pneumonia. Today she reports several episodes of diarrhea as well as nausea without vomiting. She denies fever chills chest pain palpitations. She denies shortness of breath dizziness syncope or near-syncope. He denies abdominal pain. The son with whom she resides at the bedside and reports that she has chronic diarrhea and has had this for the last 2 years. He acknowledges that yesterday and today's episodes were no worse than usual. Of note patient is wheelchair-bound and has been so for the last 2 years do to bilateral knee weakness. In the emergency department she had several episodes of loose stool. Heme + stool. She is hemodynamically stable afebrile and not hypoxic. Chest x-ray from yesterday concerning for pneumonia. She was given azithromycin and Rocephin.    Review of Systems:  10 point review of systems complete and all negative except as noted in HPI Past Medical History  Diagnosis Date  . Stroke 02/2011    large right frontal lobe hemispheric stroke  . Atrial fibrillation   . Diabetes mellitus   . Hypertension   . Coronary artery disease   . Hyperlipidemia 05/25/2011  . Anemia 05/25/2011  . Seizures     Noted at [facility] in Jan 2012, started on Keppra  . Depression   . Arthritis   . TIA (transient ischemic attack) 05/24/2011    2D Echo EF 55%-60%  . Vertigo   . Dementia    Past Surgical History  Procedure  Laterality Date  . Appendectomy    . Abdominal hysterectomy    . Back surgery  1990s  . Colonoscopy  01/29/2004    [facility]:  rectal polyp-cannot remember?path  . Colonoscopy N/A 05/03/2012    RMR: multiple colonic polyps, s/p piecemeal polypectomy, diverticulosis, fragments of tubular adenomas   Social History:  reports that she has never smoked. She does not have any smokeless tobacco history on file. She reports that she does not drink alcohol or use illicit drugs. She lives with her son. She is wheelchair-bound and has done so for the last 2 years do to bilateral lower extremity weakness. He is a retired Optometrist to Allergies  Allergen Reactions  . Ciprofloxacin     Patient states that she may have had a stroke due to this medication  . Niacin And Related Hives  . Statins Other (See Comments)    Some but not all     Family History  Problem Relation Age of Onset  . Heart disease Father    family medical history reviewed and noncontributory to the admission of this elderly  Prior to Admission medications   Medication Sig Start Date End Date Taking? Authorizing Provider  acetaminophen (TYLENOL) 500 MG tablet Take 500 mg by mouth 2 (two) times daily as needed for pain.   Yes Historical Provider, MD  atorvastatin (LIPITOR) 20 MG tablet Take 1 tablet (20 mg total) by mouth daily. 07/22/13  Yes Alycia Rossetti, MD  azithromycin (ZITHROMAX) 250 MG tablet 2 tabs poqday1, 1 tab poqday  2-5 08/13/13  Yes Susy Frizzle, MD  cefUROXime (CEFTIN) 500 MG tablet Take 1 tablet (500 mg total) by mouth 2 (two) times daily with a meal. 08/13/13  Yes Susy Frizzle, MD  clotrimazole (LOTRIMIN) 1 % cream Apply 1 application topically 2 (two) times daily. 01/02/13  Yes Alycia Rossetti, MD  donepezil (ARICEPT) 10 MG tablet Take 10 mg by mouth at bedtime.   Yes Historical Provider, MD  escitalopram (LEXAPRO) 5 MG tablet Take 5 mg by mouth daily.   Yes Historical Provider, MD  glipiZIDE (GLUCOTROL)  5 MG tablet Take 5 mg by mouth daily before breakfast.   Yes Historical Provider, MD  levETIRAcetam (KEPPRA) 500 MG tablet Take 500 mg by mouth 2 (two) times daily.   Yes Historical Provider, MD  lisinopril (PRINIVIL,ZESTRIL) 10 MG tablet Take 10 mg by mouth daily.   Yes Historical Provider, MD  memantine (NAMENDA) 10 MG tablet Take 1 tablet (10 mg total) by mouth 2 (two) times daily. 07/16/13  Yes Alycia Rossetti, MD  metoprolol tartrate (LOPRESSOR) 25 MG tablet Take 12.5 mg by mouth 2 (two) times daily.   Yes Historical Provider, MD  mirabegron ER (MYRBETRIQ) 50 MG TB24 Take 50 mg by mouth at bedtime.    Yes Historical Provider, MD  Multiple Vitamins-Minerals (CENTRUM SILVER ULTRA WOMENS PO) Take 1 tablet by mouth every morning. At 700   Yes Historical Provider, MD  potassium chloride (K-DUR) 10 MEQ tablet Take 20 mEq by mouth daily.  03/28/12  Yes Alycia Rossetti, MD  protective barrier (RESTORE) CREA Apply 1 application topically 3 (three) times a week.    Yes Historical Provider, MD  warfarin (COUMADIN) 4 MG tablet Take 4-8 mg by mouth See admin instructions. Takes 4mg  on Monday, Wednesday and Friday.  And 8mg  all other days.   Yes Historical Provider, MD  zolpidem (AMBIEN) 5 MG tablet Take 5 mg by mouth at bedtime as needed for sleep.    Historical Provider, MD   Physical Exam: Filed Vitals:   08/14/13 1327  BP: 163/77  Pulse: 108  Temp: 97.3 F (36.3 C)  Resp:     BP 163/77  Pulse 108  Temp(Src) 97.3 F (36.3 C) (Oral)  Resp 17  Ht 5\' 5"  (1.651 m)  Wt 80.003 kg (176 lb 6 oz)  BMI 29.35 kg/m2  SpO2 98%  General:  Appears calm and comfortable Eyes: PERRL, normal lids, irises & conjunctiva ENT: grossly normal hearing, lips & tongue Neck: no LAD, masses or thyromegaly Cardiovascular: Irregularly irregular no m/r/g. No LE edema. Respiratory: Normal effort. Breath sounds slightly diminished particularly in the right lower lobe. I hear no wheeze no crackles. Abdomen: soft, ntnd  positive bowel sounds throughout Skin: no rash or induration seen on limited exam Musculoskeletal: grossly normal tone BUE/BLE Psychiatric: grossly normal mood and affect, speech fluent and appropriate Neurologic: grossly non-focal. Speech clear facial symmetry           Labs on Admission:  Basic Metabolic Panel:  Recent Labs Lab 08/14/13 1043  NA 137  K 4.2  CL 99  CO2 21  GLUCOSE 235*  BUN 11  CREATININE 0.89  CALCIUM 9.5   Liver Function Tests:  Recent Labs Lab 08/14/13 1043  AST 34  ALT 29  ALKPHOS 111  BILITOT 0.4  PROT 7.4  ALBUMIN 3.6    Recent Labs Lab 08/14/13 1043  LIPASE 31   No results found for this basename: AMMONIA,  in the last  168 hours CBC:  Recent Labs Lab 08/14/13 1030  WBC 9.0  NEUTROABS 7.4  HGB 13.4  HCT 39.4  MCV 91.4  PLT 204   Cardiac Enzymes:  Recent Labs Lab 08/14/13 1030  TROPONINI <0.30    BNP (last 3 results) No results found for this basename: PROBNP,  in the last 8760 hours CBG: No results found for this basename: GLUCAP,  in the last 168 hours  Radiological Exams on Admission: Dg Chest 1 View  08/14/2013   CLINICAL DATA:  History of stroke.  Right lower lobe pneumonia.  EXAM: CHEST - 1 VIEW  COMPARISON:  Single view of the chest 07/23/2012 and 05/16/2012.  FINDINGS: Heart size is mildly enlarged but there is no pulmonary edema. No consolidative process is seen with subsegmental atelectasis seen in the left lung base. No pneumothorax or pleural effusion is identified.  IMPRESSION: Subsegmental atelectasis left lung base.  No acute finding.   Electronically Signed   By: Inge Rise M.D.   On: 08/14/2013 08:38   Dg Chest 2 View  08/14/2013   CLINICAL DATA:  Cough and chest pain.  EXAM: CHEST  2 VIEW  COMPARISON:  Chest x-ray 08/13/2013 ,07/23/2012, 07/30/2011.  FINDINGS: Mediastinum and hilar structures are normal. Stable cardiomegaly and normal pulmonary vascularity. Bibasilar pleural parenchymal thickening  again noted consistent with scarring and/or atelectasis. Mild developing infiltrates in the lung bases cannot be excluded. Followup PA lateral chest x-ray suggested. Stable cardiomegaly with normal pulmonary vascularity. No acute bony abnormality. Degenerative changes thoracic spine.  IMPRESSION: 1. Bibasilar pleural parenchymal thickening again noted consistent with scarring and/or atelectasis. 2. Developing superimposed pneumonia within the lung bases cannot be excluded. Follow-up chest x-ray suggested. 3. Stable cardiomegaly.  Normal pulmonary vascularity.   Electronically Signed   By: Marcello Moores  Register   On: 08/14/2013 11:16    EKG: Independently reviewed A. fib  Assessment/Plan Principal Problem:   CAP (community acquired pneumonia): Chest x-ray dated yesterday concerning for pneumonia. Will admit. Will obtain strep pneumo urinary antigen as well as Legionella urine antigen. Will continue azithromycin and Rocephin. Will obtain blood cultures should she spike a fever. However currently she is afebrile and nontoxic appearing. Oxygen saturation level 98% on room air. Recommend followup 2 view chest x-ray Active Problems: Nausea, vomiting, and diarrhea: in setting of reported chronic diarrhea.  She denies any nausea vomiting and reports that diarrhea is chronic. The son with whom she lives reports no worsening diarrhea. She had only 2 doses of antibiotics as an outpatient so doubt cdiff or antibiotic related diarrhea . Stool has been sent to lab will await results. Chart review confirms chronic diarrhea an hx of Questran (unable to tolerate) and some brief improvement with probiotics.     Dementia: appears to be at baseline. Continue home med    DM type 2 (diabetes mellitus, type 2): obtain hg A1c. Hold home oral agents. Use SSI for optimal control    HTN (hypertension) controlled. Continue home lisinopril, BB. Monitor closely    Paroxysmal atrial fibrillation: rate controlled. Continue home meds.  Coumadin per pharmacy. INR 2.12    Hyperlipidemia: continue statin    Anemia: Hg 13.1. Chart review indicates baseline 12-13. Stool heme +. Colonoscopy 4/14 with polypectomy. GI note recommended surveillance colonoscopy but no documentation of follow up. Reports hx hemorrhoids. Will monitor.       Code Status: full Family Communication: son at bedside Disposition Plan: home likely 24-48hours  Time spent: 62 minutes  [facility]  Pager [phone]  **Disclaimer: This note may have been dictated with voice recognition software. Similar sounding words can inadvertently be transcribed and this note may contain transcription errors which may not have been corrected upon publication of note.**

## 2013-08-14 NOTE — Progress Notes (Signed)
ANTICOAGULATION CONSULT NOTE - Initial Consult  Pharmacy Consult for Coumadin (chronic Rx PTA) Indication: atrial fibrillation  Allergies  Allergen Reactions  . Ciprofloxacin     Patient states that she may have had a stroke due to this medication  . Niacin And Related Hives  . Statins Other (See Comments)    Some but not all    Patient Measurements: Height: 5\' 5"  (165.1 cm) Weight: 176 lb 6 oz (80.003 kg) IBW/kg (Calculated) : 57  Vital Signs: Temp: 97.3 F (36.3 C) (07/08 1327) Temp src: Oral (07/08 1327) BP: 163/77 mmHg (07/08 1327) Pulse Rate: 108 (07/08 1327)  Labs:  Recent Labs  08/14/13 1030 08/14/13 1043  HGB 13.4  --   HCT 39.4  --   PLT 204  --   LABPROT 23.7*  --   INR 2.12*  --   CREATININE  --  0.89  TROPONINI <0.30  --    Estimated Creatinine Clearance: 47.4 ml/min (by C-G formula based on Cr of 0.89).  Medical History: Past Medical History  Diagnosis Date  . Stroke 02/2011    large right frontal lobe hemispheric stroke  . Atrial fibrillation   . Diabetes mellitus   . Hypertension   . Coronary artery disease   . Hyperlipidemia 05/25/2011  . Anemia 05/25/2011  . Seizures     Noted at [facility] in Jan 2012, started on Keppra  . Depression   . Arthritis   . TIA (transient ischemic attack) 05/24/2011    2D Echo EF 55%-60%  . Vertigo   . Dementia    Medications:  Prescriptions prior to admission  Medication Sig Dispense Refill  . acetaminophen (TYLENOL) 500 MG tablet Take 500 mg by mouth 2 (two) times daily as needed for pain.      Marland Kitchen atorvastatin (LIPITOR) 20 MG tablet Take 1 tablet (20 mg total) by mouth daily.  90 tablet  3  . azithromycin (ZITHROMAX) 250 MG tablet 2 tabs poqday1, 1 tab poqday 2-5  6 tablet  0  . cefUROXime (CEFTIN) 500 MG tablet Take 1 tablet (500 mg total) by mouth 2 (two) times daily with a meal.  20 tablet  0  . clotrimazole (LOTRIMIN) 1 % cream Apply 1 application topically 2 (two) times daily.  30 g  0  . donepezil  (ARICEPT) 10 MG tablet Take 10 mg by mouth at bedtime.      Marland Kitchen escitalopram (LEXAPRO) 5 MG tablet Take 5 mg by mouth daily.      Marland Kitchen glipiZIDE (GLUCOTROL) 5 MG tablet Take 5 mg by mouth daily before breakfast.      . levETIRAcetam (KEPPRA) 500 MG tablet Take 500 mg by mouth 2 (two) times daily.      Marland Kitchen lisinopril (PRINIVIL,ZESTRIL) 10 MG tablet Take 10 mg by mouth daily.      . memantine (NAMENDA) 10 MG tablet Take 1 tablet (10 mg total) by mouth 2 (two) times daily.  60 tablet  6  . metoprolol tartrate (LOPRESSOR) 25 MG tablet Take 12.5 mg by mouth 2 (two) times daily.      . mirabegron ER (MYRBETRIQ) 50 MG TB24 Take 50 mg by mouth at bedtime.       . Multiple Vitamins-Minerals (CENTRUM SILVER ULTRA WOMENS PO) Take 1 tablet by mouth every morning. At 700      . potassium chloride (K-DUR) 10 MEQ tablet Take 20 mEq by mouth daily.       . protective barrier (RESTORE) CREA Apply 1  application topically 3 (three) times a week.       . warfarin (COUMADIN) 4 MG tablet Take 4-8 mg by mouth See admin instructions. Takes 4mg  on Monday, Wednesday and Friday.  And 8mg  all other days.      Marland Kitchen zolpidem (AMBIEN) 5 MG tablet Take 5 mg by mouth at bedtime as needed for sleep.       Assessment: 80yo female on chronic Coumadin PTA for h/o afib.  INR is therapeutic on admission.  Home dose is listed above.    Goal of Therapy:  INR 2-3 Monitor platelets by anticoagulation protocol: Yes   Plan:  Coumadin 4mg  po today x 1 (home dose) INR daily  [place] Crane, Kaylob Wallen A 08/14/2013,2:13 PM

## 2013-08-14 NOTE — ED Notes (Signed)
Stool sample obtained via bedside commode.

## 2013-08-14 NOTE — Progress Notes (Signed)
UR chart review completed.  

## 2013-08-14 NOTE — H&P (Signed)
Patient seen, independently examined and chart reviewed. I agree with exam, assessment and plan discussed with Dyanne Carrel, NP.  80 year old woman with history of cough for the last several days, seen by her primary care physician 7/7, diagnosed with pneumonia and started on Keflex and Zithromax. Presented to the emergency department today 7/8 with worsening cough, generalized weakness, reported nausea or vomiting although that is currently denied. EDP reported voluminous diarrhea at home, however the patient has chronic diarrhea which is at baseline. She was admitted for further evaluation and treatment of pneumonia, nausea and vomiting.  PMH History of stroke Atrial fibrillation Diabetes mellitus Coronary artery disease Seizures Dementia Chronic diarrhea Wheelchair-bound secondary to bilateral knee weakness  Subjective: Currently she feels well, eating dinner, has no complaints except for some cough. No nausea, vomiting, abdominal pain. She reports diarrhea baseline.  Objective: Afebrile, vital signs stable. Nontoxic  Gen. Appears calm and comfortable. Speech fluent and clear.  Psych. Grossly normal mood and affect. Speech fluent and appropriate.  ENT.  Lips appear unremarkable.  Neck. Appears grossly unremarkable  Cardiovascular.  regular rate and rhythm. No murmur, rub gallop. No lower extremity edema.   Respiratory.  clear to auscultation bilaterally. No wheezes, rales or rhonchi. Normal respiratory effort.   Abdomen. Soft, nontender, nondistended.   Skin. Appears grossly unremarkable  Musculoskeletal.  grossly unremarkable   Neurologic.  appears grossly normal    Chemistry: Complete metabolic panel unremarkable. Lactic acid is modestly elevated. Troponin negative.  Heme: CBC unremarkable.  Imaging: Chest x-ray with suspected developing pneumonia.  Community acquired pneumonia without hypoxia. Plan empiric antibiotics. Hopefully can transition to oral and discharged  in the next 48 hours.  Nausea, vomiting. appears resolved. Currently eating dinner. Monitor clinically.  Chronic diarrhea at baseline per family. followup GI pathogen panel but doubt significant.   Murray Hodgkins, MD [facility] [phone]

## 2013-08-14 NOTE — ED Notes (Signed)
Pt reports saw PCP yesterday for cough and chest pain.  Reports had x ray and was told either had pneumonia or fluid in lungs.  Reports was started on an antibiotic.  Pt also reports vomiting and diarrhea since yesterday.

## 2013-08-14 NOTE — Progress Notes (Signed)
ANTIBIOTIC CONSULT NOTE - INITIAL  Pharmacy Consult for Rocephin Indication: CAP  Allergies  Allergen Reactions  . Ciprofloxacin     Patient states that she may have had a stroke due to this medication  . Niacin And Related Hives  . Statins Other (See Comments)    Some but not all     Patient Measurements: Height: 5\' 5"  (165.1 cm) Weight: 176 lb 6 oz (80.003 kg) IBW/kg (Calculated) : 57  Vital Signs: Temp: 97.3 F (36.3 C) (07/08 1327) Temp src: Oral (07/08 1327) BP: 163/77 mmHg (07/08 1327) Pulse Rate: 108 (07/08 1327) Intake/Output from previous day:   Intake/Output from this shift:    Labs:  Recent Labs  08/14/13 1030 08/14/13 1043  WBC 9.0  --   HGB 13.4  --   PLT 204  --   CREATININE  --  0.89   Estimated Creatinine Clearance: 47.4 ml/min (by C-G formula based on Cr of 0.89). No results found for this basename: VANCOTROUGH, VANCOPEAK, VANCORANDOM, GENTTROUGH, GENTPEAK, GENTRANDOM, TOBRATROUGH, TOBRAPEAK, TOBRARND, AMIKACINPEAK, AMIKACINTROU, AMIKACIN,  in the last 72 hours   Microbiology: No results found for this or any previous visit (from the past 720 hour(s)).  Medical History: Past Medical History  Diagnosis Date  . Stroke 02/2011    large right frontal lobe hemispheric stroke  . Atrial fibrillation   . Diabetes mellitus   . Hypertension   . Coronary artery disease   . Hyperlipidemia 05/25/2011  . Anemia 05/25/2011  . Seizures     Noted at [facility] in Jan 2012, started on Keppra  . Depression   . Arthritis   . TIA (transient ischemic attack) 05/24/2011    2D Echo EF 55%-60%  . Vertigo   . Dementia    Rocephin 7/8 >>  Assessment: 80yo female admitted with worsening cough and suspected CAP.  Asked to initiate Rocephin.  Pt received initial dose on admission.  Goal of Therapy:  Eradicate infection.  Plan:  Rocephin 1gm IV q24hrs Monitor labs, renal fxn, and cultures.  Hart Robinsons A 08/14/2013,2:11 PM

## 2013-08-14 NOTE — ED Provider Notes (Signed)
CSN: [id number]     Arrival date & time 08/14/13  1015 History   First MD Initiated Contact with Patient 08/14/13 1022     Chief Complaint  Patient presents with  . Cough  . Chest Pain      Patient is a 80 y.o. female presenting with cough and chest pain. The history is provided by a caregiver. The history is limited by the condition of the patient (Hx dementia).  Cough Associated symptoms: chest pain   Chest Pain Associated symptoms: cough    Pt was seen at 1020.  Per pt's caregiver and pt: c/o pt with gradual onset and persistence of constant "coughing" for the past 3 to 4 days. Cough worsens at night when pt lays down. Pt has told her caregiver her "chest hurts" when she coughs. Pt was evaluated by her PMD yesterday for same, rx abx for presumed pneumonia. Pt's symptoms continue today. Pt has had several intermittent episodes of "yellow diarrhea" since yesterday, as well as several episodes of N/V. Denies fevers, no palpitations, no SOB, no abd pain, no black or blood in stools.      Past Medical History  Diagnosis Date  . Stroke 02/2011    large right frontal lobe hemispheric stroke  . Atrial fibrillation   . Diabetes mellitus   . Hypertension   . Coronary artery disease   . Hyperlipidemia 05/25/2011  . Anemia 05/25/2011  . Seizures     Noted at [facility] in Jan 2012, started on Keppra  . Depression   . Arthritis   . TIA (transient ischemic attack) 05/24/2011    2D Echo EF 55%-60%  . Vertigo   . Dementia    Past Surgical History  Procedure Laterality Date  . Appendectomy    . Abdominal hysterectomy    . Back surgery  1990s  . Colonoscopy  01/29/2004    [facility]:  rectal polyp-cannot remember?path  . Colonoscopy N/A 05/03/2012    RMR: multiple colonic polyps, s/p piecemeal polypectomy, diverticulosis, fragments of tubular adenomas   Family History  Problem Relation Age of Onset  . Heart disease Father    History  Substance Use Topics  . Smoking status: Never  Smoker   . Smokeless tobacco: Not on file  . Alcohol Use: No    Review of Systems  Unable to perform ROS: Dementia  Respiratory: Positive for cough.   Cardiovascular: Positive for chest pain.      Allergies  Ciprofloxacin; Niacin and related; and Statins  Home Medications   Prior to Admission medications   Medication Sig Start Date End Date Taking? Authorizing Provider  acetaminophen (TYLENOL) 500 MG tablet Take 500 mg by mouth 2 (two) times daily as needed for pain.    Historical Provider, MD  atorvastatin (LIPITOR) 20 MG tablet Take 1 tablet (20 mg total) by mouth daily. 07/22/13   Alycia Rossetti, MD  azithromycin (ZITHROMAX) 250 MG tablet 2 tabs poqday1, 1 tab poqday 2-5 08/13/13   Susy Frizzle, MD  benzonatate (TESSALON) 200 MG capsule Take 1 capsule (200 mg total) by mouth 2 (two) times daily as needed for cough. 04/22/13   Lonie Peak Dixon, PA-C  cefUROXime (CEFTIN) 500 MG tablet Take 1 tablet (500 mg total) by mouth 2 (two) times daily with a meal. 08/13/13   Susy Frizzle, MD  clotrimazole (LOTRIMIN) 1 % cream Apply 1 application topically 2 (two) times daily. 01/02/13   Alycia Rossetti, MD  donepezil (ARICEPT) 10 MG tablet TAKE  1 TABLET BY MOUTH EVERY DAY 04/09/13   Alycia Rossetti, MD  escitalopram (LEXAPRO) 5 MG tablet TAKE 1 TABLET BY MOUTH EVERY MORNING 04/09/13   Alycia Rossetti, MD  folic acid (FOLVITE) 1 MG tablet Take 1 mg by mouth every morning. At 700    Historical Provider, MD  glipiZIDE (GLUCOTROL) 5 MG tablet TAKE 1 TABLET BY MOUTH EVERY DAY 04/09/13   Alycia Rossetti, MD  levETIRAcetam (KEPPRA) 500 MG tablet TAKE 1 TABLET BY MOUTH TWICE DAILY 04/09/13   Alycia Rossetti, MD  lisinopril (PRINIVIL,ZESTRIL) 10 MG tablet TAKE 1 TABLET BY MOUTH EVERY DAY 04/09/13   Alycia Rossetti, MD  memantine (NAMENDA) 10 MG tablet Take 1 tablet (10 mg total) by mouth 2 (two) times daily. 07/16/13   Alycia Rossetti, MD  Memantine HCl ER (NAMENDA XR) 28 MG CP24 1 tablet daily for  dementia 06/04/13   Alycia Rossetti, MD  metoprolol tartrate (LOPRESSOR) 25 MG tablet TAKE 1/2 TABLET BY MOUTH TWICE DAILY    Alycia Rossetti, MD  mirabegron ER (MYRBETRIQ) 50 MG TB24 Take 50 mg by mouth at bedtime.     Historical Provider, MD  Multiple Vitamins-Minerals (CENTRUM SILVER ULTRA WOMENS PO) Take 1 tablet by mouth every morning. At 700    Historical Provider, MD  potassium chloride (K-DUR) 10 MEQ tablet Take 20 mEq by mouth daily.  03/28/12   Alycia Rossetti, MD  protective barrier (RESTORE) CREA Apply 1 application topically 3 (three) times a week.     Historical Provider, MD  warfarin (COUMADIN) 4 MG tablet Take 4-8 mg by mouth See admin instructions. Takes 4mg  on Monday, Wednesday and Friday.  And 8mg  all other days.    Historical Provider, MD  warfarin (COUMADIN) 4 MG tablet TAKE 1 TO 2 TABLETS BY MOUTH DAILY AS DIRECTED 08/08/13   Mihai Croitoru, MD  zolpidem (AMBIEN) 5 MG tablet Take 5 mg by mouth at bedtime as needed for sleep.    Historical Provider, MD   BP 146/80  Pulse 60  Temp(Src) 99.4 F (37.4 C) (Rectal)  Resp 17  SpO2 96%   Physical Exam 1025: Physical examination:  Nursing notes reviewed; Vital signs and O2 SAT reviewed;  Constitutional: Well developed, Well nourished, In no acute distress; Head:  Normocephalic, atraumatic; Eyes: EOMI, PERRL, No scleral icterus; ENMT: Mouth and pharynx normal, Mucous membranes dry; Neck: Supple, Full range of motion, No lymphadenopathy; Cardiovascular: Irregular irregular rate and rhythm, No gallop; Respiratory: Breath sounds coarse & equal bilaterally, No wheezes.  Speaking full sentences with ease, Normal respiratory effort/excursion; Chest: Nontender, Movement normal; Abdomen: Soft, Nontender, Nondistended, Normal bowel sounds.; Genitourinary: No CVA tenderness; Extremities: Pulses normal, No tenderness, No edema, No calf edema or asymmetry.; Neuro: Awake, alert, confused re: events per hx dementia. Major CN grossly intact.  Speech  clear. No gross focal motor or sensory deficits in extremities. Pt able to stand and transfer from wheelchair to bed.; Skin: Color normal, Warm, Dry.   ED Course  Procedures     EKG Interpretation   Date/Time:  Wednesday August 14 2013 10:58:55 EDT Ventricular Rate:  59 PR Interval:    QRS Duration: 107 QT Interval:  453 QTC Calculation: 449 R Axis:   -51 Text Interpretation:  Atrial fibrillation Left axis deviation Left  anterior fascicular block Abnormal R-wave progression, late transition LVH  with secondary repolarization abnormality When compared with ECG of  11/19/2012 No significant change was found Confirmed by [facility]  MD,  Nunzio Cory [phone]) on 08/14/2013 11:03:47 AM      MDM  MDM Reviewed: previous chart, nursing note and vitals Reviewed previous: labs and ECG Interpretation: labs, ECG and x-ray    . Results for orders placed during the hospital encounter of 08/14/13  CBC WITH DIFFERENTIAL      Result Value Ref Range   WBC 9.0  4.0 - 10.5 K/uL   RBC 4.31  3.87 - 5.11 MIL/uL   Hemoglobin 13.4  12.0 - 15.0 g/dL   HCT 39.4  36.0 - 46.0 %   MCV 91.4  78.0 - 100.0 fL   MCH 31.1  26.0 - 34.0 pg   MCHC 34.0  30.0 - 36.0 g/dL   RDW 13.5  11.5 - 15.5 %   Platelets 204  150 - 400 K/uL   Neutrophils Relative % 83 (*) 43 - 77 %   Neutro Abs 7.4  1.7 - 7.7 K/uL   Lymphocytes Relative 12  12 - 46 %   Lymphs Abs 1.1  0.7 - 4.0 K/uL   Monocytes Relative 4  3 - 12 %   Monocytes Absolute 0.4  0.1 - 1.0 K/uL   Eosinophils Relative 1  0 - 5 %   Eosinophils Absolute 0.1  0.0 - 0.7 K/uL   Basophils Relative 0  0 - 1 %   Basophils Absolute 0.0  0.0 - 0.1 K/uL  LACTIC ACID, PLASMA      Result Value Ref Range   Lactic Acid, Venous 2.9 (*) 0.5 - 2.2 mmol/L  TROPONIN I      Result Value Ref Range   Troponin I <0.30  <0.30 ng/mL  PROTIME-INR      Result Value Ref Range   Prothrombin Time 23.7 (*) 11.6 - 15.2 seconds   INR 2.12 (*) 0.00 - 1.49  COMPREHENSIVE METABOLIC PANEL       Result Value Ref Range   Sodium 137  137 - 147 mEq/L   Potassium 4.2  3.7 - 5.3 mEq/L   Chloride 99  96 - 112 mEq/L   CO2 21  19 - 32 mEq/L   Glucose, Bld 235 (*) 70 - 99 mg/dL   BUN 11  6 - 23 mg/dL   Creatinine, Ser 0.89  0.50 - 1.10 mg/dL   Calcium 9.5  8.4 - 10.5 mg/dL   Total Protein 7.4  6.0 - 8.3 g/dL   Albumin 3.6  3.5 - 5.2 g/dL   AST 34  0 - 37 U/L   ALT 29  0 - 35 U/L   Alkaline Phosphatase 111  39 - 117 U/L   Total Bilirubin 0.4  0.3 - 1.2 mg/dL   GFR calc non Af Amer 57 (*) >90 mL/min   GFR calc Af Amer 66 (*) >90 mL/min   Anion gap 17 (*) 5 - 15  LIPASE, BLOOD      Result Value Ref Range   Lipase 31  11 - 59 U/L  POC OCCULT BLOOD, ED      Result Value Ref Range   Fecal Occult Bld POSITIVE (*) NEGATIVE    Dg Chest 2 View 08/14/2013   CLINICAL DATA:  Cough and chest pain.  EXAM: CHEST  2 VIEW  COMPARISON:  Chest x-ray 08/13/2013 ,07/23/2012, 07/30/2011.  FINDINGS: Mediastinum and hilar structures are normal. Stable cardiomegaly and normal pulmonary vascularity. Bibasilar pleural parenchymal thickening again noted consistent with scarring and/or atelectasis. Mild developing infiltrates in the lung bases cannot be excluded. Followup  PA lateral chest x-ray suggested. Stable cardiomegaly with normal pulmonary vascularity. No acute bony abnormality. Degenerative changes thoracic spine.  IMPRESSION: 1. Bibasilar pleural parenchymal thickening again noted consistent with scarring and/or atelectasis. 2. Developing superimposed pneumonia within the lung bases cannot be excluded. Follow-up chest x-ray suggested. 3. Stable cardiomegaly.  Normal pulmonary vascularity.   Electronically Signed   By: Marcello Moores  Register   On: 08/14/2013 11:16     1225:  Pt has stooled multiple times while in the ED: heme positive, GI pathogen panel pending. Will start IV abx for CAP. Dx and testing d/w pt and family.  Questions answered.  Verb understanding, agreeable to admit. T/C to [facility] Dr. Sarajane Jews,  case discussed, including:  HPI, pertinent PM/SHx, VS/PE, dx testing, ED course and treatment:  Agreeable to admit, requests to write temporary orders, obtain inpt medical bed to team 2.   Alfonzo Feller, DO 08/15/13 [phone]

## 2013-08-15 ENCOUNTER — Ambulatory Visit: Payer: Medicare Other | Admitting: Family Medicine

## 2013-08-15 DIAGNOSIS — D649 Anemia, unspecified: Secondary | ICD-10-CM

## 2013-08-15 DIAGNOSIS — Z7901 Long term (current) use of anticoagulants: Secondary | ICD-10-CM

## 2013-08-15 LAB — GLUCOSE, CAPILLARY
Glucose-Capillary: 168 mg/dL — ABNORMAL HIGH (ref 70–99)
Glucose-Capillary: 203 mg/dL — ABNORMAL HIGH (ref 70–99)

## 2013-08-15 LAB — CBC
HEMATOCRIT: 35.4 % — AB (ref 36.0–46.0)
HEMOGLOBIN: 12.1 g/dL (ref 12.0–15.0)
MCH: 30.9 pg (ref 26.0–34.0)
MCHC: 34.2 g/dL (ref 30.0–36.0)
MCV: 90.5 fL (ref 78.0–100.0)
Platelets: 189 10*3/uL (ref 150–400)
RBC: 3.91 MIL/uL (ref 3.87–5.11)
RDW: 13.6 % (ref 11.5–15.5)
WBC: 6.2 10*3/uL (ref 4.0–10.5)

## 2013-08-15 LAB — GI PATHOGEN PANEL BY PCR, STOOL
C difficile toxin A/B: NEGATIVE
Campylobacter by PCR: NEGATIVE
Cryptosporidium by PCR: NEGATIVE
E COLI 0157 BY PCR: NEGATIVE
E coli (ETEC) LT/ST: NEGATIVE
E coli (STEC): NEGATIVE
G lamblia by PCR: NEGATIVE
Norovirus GI/GII: NEGATIVE
Rotavirus A by PCR: NEGATIVE
Salmonella by PCR: NEGATIVE
Shigella by PCR: NEGATIVE

## 2013-08-15 LAB — BASIC METABOLIC PANEL
ANION GAP: 13 (ref 5–15)
BUN: 7 mg/dL (ref 6–23)
CO2: 22 meq/L (ref 19–32)
Calcium: 8.8 mg/dL (ref 8.4–10.5)
Chloride: 104 mEq/L (ref 96–112)
Creatinine, Ser: 0.73 mg/dL (ref 0.50–1.10)
GFR calc Af Amer: 87 mL/min — ABNORMAL LOW (ref >90)
GFR calc non Af Amer: 75 mL/min — ABNORMAL LOW (ref >90)
Glucose, Bld: 170 mg/dL — ABNORMAL HIGH (ref 70–99)
Potassium: 3.9 mEq/L (ref 3.7–5.3)
SODIUM: 139 meq/L (ref 137–147)

## 2013-08-15 LAB — PROTIME-INR
INR: 2.01 — ABNORMAL HIGH (ref 0.00–1.49)
Prothrombin Time: 22.8 seconds — ABNORMAL HIGH (ref 11.6–15.2)

## 2013-08-15 MED ORDER — CEFUROXIME AXETIL 500 MG PO TABS: 500.0000 mg | ORAL_TABLET | Freq: Two times a day (BID) | ORAL | Status: AC

## 2013-08-15 MED ORDER — GUAIFENESIN ER 600 MG PO TB12: 1200.0000 mg | ORAL_TABLET | Freq: Two times a day (BID) | ORAL | Status: DC | PRN

## 2013-08-15 MED ORDER — CEFUROXIME AXETIL 500 MG PO TABS: 500.0000 mg | ORAL_TABLET | Freq: Two times a day (BID) | ORAL | Status: DC

## 2013-08-15 MED ORDER — GUAIFENESIN ER 600 MG PO TB12: 1200.0000 mg | ORAL_TABLET | Freq: Two times a day (BID) | ORAL | Status: AC | PRN

## 2013-08-15 MED ORDER — AZITHROMYCIN 250 MG PO TABS: ORAL_TABLET | ORAL | Status: AC

## 2013-08-15 NOTE — Discharge Summary (Signed)
Patient seen, independently examined and chart reviewed. I agree with exam, assessment and plan discussed with Dyanne Carrel, NP.  Subjective: No issues charted overnight. Physical therapy assessment has recommended home health PT.  Feels good. Breathing fine. No abdominal pain. No nausea, vomiting.  Objective: Afebrile, vital signs stable. No hypoxia.  Gen. Appears calm and comfortable, sitting in chair.  Psych. Alert. Speech fluent and clear.  Cardiovascular. Regular rate and rhythm. No murmur, rub or gallop. No lower extremity edema.  Respiratory. Clear to auscultation bilaterally. No wheezes, rales or rhonchi. Normal respiratory effort.  Abdomen. Soft, nontender, nondistended.   I/O: Multiple voids. BM x2.  Chemistry: Basic metabolic panel unremarkable. CBC unremarkable. Hemoglobin A1c 7.2. TSH 1.93.   Heme: CBC unremarkable  ID: C. difficile PCR negative  Community acquired pneumonia without hypoxia. Clinically improved. Plan home on oral antibiotics today.  Nausea and vomiting? None apparent. No further evaluation suggested. History is somewhat unreliable secondary to dementia.  Chronic diarrhea at baseline. Heme positive but hemoglobin stable, likely related to hemorrhoids. Can be followed up in the outpatient setting as clinically indicated.  Home with home health PT.  July 13, 2:10 PM appointment with [facility] to closely follow INR while on antibiotics.  Murray Hodgkins, MD [facility] [phone]

## 2013-08-15 NOTE — Discharge Summary (Signed)
Physician Discharge Summary  [name]:[id number] DOB: 06/13/1924 DOA: 08/14/2013  PCP: Vic Blackbird, MD  Admit date: 08/14/2013 Discharge date: 08/15/2013  Time spent:  minutes  Recommendations for Outpatient Follow-up:  1. Has appointment with PCP 7/13 to follow up on pneumonia/cough/functionality/heme + stool and follow GI pathogen panel. 2.   Discharge Diagnoses:  Principal Problem:   CAP (community acquired pneumonia) Active Problems:   Dementia   DM type 2 (diabetes mellitus, type 2)   HTN (hypertension)   Paroxysmal atrial fibrillation   Hyperlipidemia   Anemia   Nausea, vomiting, and diarrhea   Discharge Condition: stable  Diet recommendation: carb modified heart healthy  Filed Weights   08/14/13 1327  Weight: 80.003 kg (176 lb 6 oz)    History of present illness:  Helen [name] is a 80 y.o. female with a past medical history that includes diabetes, A. fib, stroke, seizures, dementia presented to the emergency department with the chief complaint of diarrhea and persistent cough. She was seen by her PCP day prior to presentation for a four-day history of worsening cough and generalized weakness. She was treated with antibiotics for presumed pneumonia. She reported several episodes of diarrhea as well as nausea without vomiting. She denied fever chills chest pain palpitations. She denied shortness of breath dizziness syncope or near-syncope. She denied abdominal pain. The son with whom she resides at the bedside and reported that she has chronic diarrhea and has had this for the last 2 years. He acknowledged that  episodes were no worse than usual. Of note patient is wheelchair-bound and has been so for the last 2 years doe to bilateral knee weakness.  In the emergency department she had several episodes of loose stool. Heme + stool. She is hemodynamically stable afebrile and not hypoxic. Chest x-ray from 08/13/13 concerning for pneumonia. She was given azithromycin and  Rocephin.   Hospital Course:  CAP (community acquired pneumonia): Chest x-ray dated 08/13/13 concerning for pneumonia. Strep pneumo urinary antigen as well as Legionella urine antigen negative. Lactic acid only slightly elevated. Troponin negative. Provided with  azithromycin and Rocephin x 2 doses. She remained afebrile and nontoxic appearing. Oxygen saturation level remained >90% on room air. Will discharge with ceftin for 7 more days and azithromycin for 5 days. Recommend followup 2 view chest x-ray  Active Problems:   Nausea, vomiting, and diarrhea: in setting of reported chronic diarrhea. She denied any nausea vomiting and reported that diarrhea is chronic. Stool studies negative for cdiff. GI pathogen panel pending at discharge. She had no further stools during this hospitalization. Appetite good at discharge. Follow GI pathogen panel.    Dementia: remained at baseline.   DM type 2 (diabetes mellitus, type 2): Hg A1c 7.2. Stable during this hospitalization   HTN (hypertension) controlled. Continue home lisinopril, BB. Monitor closely   Paroxysmal atrial fibrillation: rate controlled.  INR 2.12 on admission  Hyperlipidemia: continue statin   Anemia: Hg 12.1 Chart review indicates baseline 12-13. Stool heme +. Colonoscopy 4/14 with polypectomy. GI note recommended surveillance colonoscopy but no documentation of follow up. Reports hx hemorrhoids.     Procedures: none Consultations:  none  Discharge Exam: Filed Vitals:   08/15/13 0515  BP: 177/69  Pulse: 62  Temp: 98.3 F (36.8 C)  Resp: 18    General: well nourished sitting in chair. Appears well Cardiovascular:  RRR No MGR No LE edema PPP Respiratory: normal effort BS slightly distant in bases but clear otherwise. No wheeze, no  crackles  Discharge Instructions You were cared for by a hospitalist during your hospital stay. If you have any questions about your discharge medications or the care you received while you were in  the hospital after you are discharged, you can call the unit and asked to speak with the hospitalist on call if the hospitalist that took care of you is not available. Once you are discharged, your primary care physician will handle any further medical issues. Please note that NO REFILLS for any discharge medications will be authorized once you are discharged, as it is imperative that you return to your primary care physician (or establish a relationship with a primary care physician if you do not have one) for your aftercare needs so that they can reassess your need for medications and monitor your lab values.     Medication List         acetaminophen 500 MG tablet  Commonly known as:  TYLENOL  Take 500 mg by mouth 2 (two) times daily as needed for pain.     atorvastatin 20 MG tablet  Commonly known as:  LIPITOR  Take 1 tablet (20 mg total) by mouth daily.     azithromycin 250 MG tablet  Commonly known as:  ZITHROMAX Z-PAK  Take 2 tabs 7/10 then take 1 tab 7/11, 7/12, 7/13, 7/14.     cefUROXime 500 MG tablet  Commonly known as:  CEFTIN  Take 1 tablet (500 mg total) by mouth 2 (two) times daily with a meal.     CENTRUM SILVER ULTRA WOMENS PO  Take 1 tablet by mouth every morning. At 700     clotrimazole 1 % cream  Commonly known as:  LOTRIMIN  Apply 1 application topically 2 (two) times daily.     donepezil 10 MG tablet  Commonly known as:  ARICEPT  Take 10 mg by mouth at bedtime.     escitalopram 5 MG tablet  Commonly known as:  LEXAPRO  Take 5 mg by mouth daily.     glipiZIDE 5 MG tablet  Commonly known as:  GLUCOTROL  Take 5 mg by mouth daily before breakfast.     guaiFENesin 600 MG 12 hr tablet  Commonly known as:  MUCINEX  Take 2 tablets (1,200 mg total) by mouth 2 (two) times daily as needed for cough or to loosen phlegm.     levETIRAcetam 500 MG tablet  Commonly known as:  KEPPRA  Take 500 mg by mouth 2 (two) times daily.     lisinopril 10 MG tablet  Commonly  known as:  PRINIVIL,ZESTRIL  Take 10 mg by mouth daily.     memantine 10 MG tablet  Commonly known as:  NAMENDA  Take 1 tablet (10 mg total) by mouth 2 (two) times daily.     metoprolol tartrate 25 MG tablet  Commonly known as:  LOPRESSOR  Take 12.5 mg by mouth 2 (two) times daily.     MYRBETRIQ 50 MG Tb24 tablet  Generic drug:  mirabegron ER  Take 50 mg by mouth at bedtime.     potassium chloride 10 MEQ tablet  Commonly known as:  K-DUR  Take 20 mEq by mouth daily.     protective barrier Crea  Apply 1 application topically 3 (three) times a week.     warfarin 4 MG tablet  Commonly known as:  COUMADIN  Take 4-8 mg by mouth See admin instructions. Takes 4mg  on Monday, Wednesday and Friday.  And 8mg  all other  days.     zolpidem 5 MG tablet  Commonly known as:  AMBIEN  Take 5 mg by mouth at bedtime as needed for sleep.       Allergies  Allergen Reactions  . Ciprofloxacin     Patient states that she may have had a stroke due to this medication  . Niacin And Related Hives  . Statins Other (See Comments)    Some but not all        Follow-up Information   Follow up with Vic Blackbird, MD. Schedule an appointment as soon as possible for a visit on 08/19/2013. (appointment with Orlie Pollen at 12:15 )    Specialty:  Family Medicine   Contact information:   [address] [place] [address] [phone]        The results of significant diagnostics from this hospitalization (including imaging, microbiology, ancillary and laboratory) are listed below for reference.    Significant Diagnostic Studies: Dg Chest 1 View  08/14/2013   CLINICAL DATA:  History of stroke.  Right lower lobe pneumonia.  EXAM: CHEST - 1 VIEW  COMPARISON:  Single view of the chest 07/23/2012 and 05/16/2012.  FINDINGS: Heart size is mildly enlarged but there is no pulmonary edema. No consolidative process is seen with subsegmental atelectasis seen in the left lung base. No pneumothorax or  pleural effusion is identified.  IMPRESSION: Subsegmental atelectasis left lung base.  No acute finding.   Electronically Signed   By: Inge Rise M.D.   On: 08/14/2013 08:38   Dg Chest 2 View  08/14/2013   CLINICAL DATA:  Cough and chest pain.  EXAM: CHEST  2 VIEW  COMPARISON:  Chest x-ray 08/13/2013 ,07/23/2012, 07/30/2011.  FINDINGS: Mediastinum and hilar structures are normal. Stable cardiomegaly and normal pulmonary vascularity. Bibasilar pleural parenchymal thickening again noted consistent with scarring and/or atelectasis. Mild developing infiltrates in the lung bases cannot be excluded. Followup PA lateral chest x-ray suggested. Stable cardiomegaly with normal pulmonary vascularity. No acute bony abnormality. Degenerative changes thoracic spine.  IMPRESSION: 1. Bibasilar pleural parenchymal thickening again noted consistent with scarring and/or atelectasis. 2. Developing superimposed pneumonia within the lung bases cannot be excluded. Follow-up chest x-ray suggested. 3. Stable cardiomegaly.  Normal pulmonary vascularity.   Electronically Signed   By: Marcello Moores  Register   On: 08/14/2013 11:16    Microbiology: Recent Results (from the past 240 hour(s))  CLOSTRIDIUM DIFFICILE BY PCR     Status: None   Collection Time    08/14/13 11:50 AM      Result Value Ref Range Status   C difficile by pcr NEGATIVE  NEGATIVE Final     Labs: Basic Metabolic Panel:  Recent Labs Lab 08/14/13 1043 08/15/13 0708  NA 137 139  K 4.2 3.9  CL 99 104  CO2 21 22  GLUCOSE 235* 170*  BUN 11 7  CREATININE 0.89 0.73  CALCIUM 9.5 8.8   Liver Function Tests:  Recent Labs Lab 08/14/13 1043  AST 34  ALT 29  ALKPHOS 111  BILITOT 0.4  PROT 7.4  ALBUMIN 3.6    Recent Labs Lab 08/14/13 1043  LIPASE 31   No results found for this basename: AMMONIA,  in the last 168 hours CBC:  Recent Labs Lab 08/14/13 1030 08/15/13 0708  WBC 9.0 6.2  NEUTROABS 7.4  --   HGB 13.4 12.1  HCT 39.4 35.4*  MCV  91.4 90.5  PLT 204 189   Cardiac Enzymes:  Recent Labs  Lab 08/14/13 1030  TROPONINI <0.30   BNP: BNP (last 3 results) No results found for this basename: PROBNP,  in the last 8760 hours CBG:  Recent Labs Lab 08/14/13 1608 08/14/13 2138 08/15/13 0721  GLUCAP 132* 165* 168*       Signed:  Maxcine Strong M  [facility] 08/15/2013, 9:24 AM

## 2013-08-15 NOTE — Evaluation (Signed)
Physical Therapy Evaluation Patient Details Name: Helen [name] MRN: [id number] DOB: 1924/05/21 Today's Date: 08/15/2013   History of Present Illness  Helen [name] is a 80 y.o. female with a past medical history that includes diabetes, A. fib, stroke, seizures, dementia presents to the emergency department with the chief complaint of diarrhea and persistent cough. She was seen by her PCP yesterday for a four-day history of worsening cough and generalized weakness. She was treated with antibiotics for presumed pneumonia. Today she reports several episodes of diarrhea as well as nausea without vomiting. She denies fever chills chest pain palpitations. She denies shortness of breath dizziness syncope or near-syncope. He denies abdominal pain. The son with whom she resides at the bedside and reports that she has chronic diarrhea and has had this for the last 2 years. He acknowledges that yesterday and today's episodes were no worse than usual. Of note patient is wheelchair-bound and has been so for the last 2 years do to bilateral knee weakness.  Clinical Impression  Pt is an 80 year old female who presents to physical therapy with dx of CAP.  Pt lives in a mobile home with ramped entrance, with her son and family friend.  Her son is available PRN, friend 24/7, and an aide M-F from 8-12.  Pt reports she is mod (I) with bed mobility skills, and squat pivot transfers to the commode.  Pt does not amb, though able to take 1-2 steps to commode while holding onto grab bar at home, and reports she is able to self propel W/C in the home for mobility.  During evaluation, bed mobility not assessed as pt up in chair prior to PT visit; pt reports she had 1 person with her to help as needed with bed mobility. Squat pivot transfers completed with min assist for balance.  Recommend continued PT while in the hospital to assess improvements for functional mobility skills to return to mod (I), with transition to HHPT to assess  safety in the home.  No DME recommendations at this time.     Follow Up Recommendations Home health PT    Equipment Recommendations  None recommended by PT       Precautions / Restrictions Precautions Precautions: Fall Restrictions Weight Bearing Restrictions: No      Mobility  Bed Mobility               General bed mobility comments: Not assessed as pt up in chair prior to PT visit.  Pt reports she is mod (I) though NSG aide stood by her this morning to assist if needed.   Transfers Overall transfer level: Needs assistance Equipment used: 1 person hand held assist Transfers: Sit to/from [employer] Sit to Stand: Min guard   Squat pivot transfers: Min assist     General transfer comment: Min assist required with stand pivot transfer for balance  Ambulation/Gait             General Gait Details: Pt does not amb per her reports.       Balance Overall balance assessment: Needs assistance         Standing balance support: During functional activity;Bilateral upper extremity supported Standing balance-Leahy Scale: Fair                               Pertinent Vitals/Pain No pain reported.     Home Living Family/patient expects to be discharged to:: Private  residence Living Arrangements: Children;Non-relatives/Friends Available Help at Discharge: Family;Friend(s);Personal care attendant (Son available PRN, Aide M-F from 6-12, friend available 24/7) Type of Home: Mobile home Home Access: Ramped entrance     Home Layout: One level Home Equipment: Bedside commode;Wheelchair - manual;Grab bars - tub/shower;Shower seat Additional Comments: Walk  In Shower    Prior Function Level of Independence: Needs assistance   Gait / Transfers Assistance Needed: Pt reports she is mod (I) with bed mobility skills and transfers.  Pt reports she does not amb, though she is able to take a step or two to the commode while holding onto W/C if  needed  ADL's / Homemaking Assistance Needed: Pt requires assistance with bathing        Hand Dominance   Dominant Hand: Right    Extremity/Trunk Assessment               Lower Extremity Assessment: Generalized weakness         Communication   Communication: No difficulties  Cognition Arousal/Alertness: Awake/alert Behavior During Therapy: WFL for tasks assessed/performed Overall Cognitive Status: Within Functional Limits for tasks assessed                        Assessment/Plan    PT Assessment Patient needs continued PT services  PT Diagnosis Generalized weakness   PT Problem List Decreased strength;Decreased activity tolerance;Decreased balance;Decreased mobility  PT Treatment Interventions Balance training;Neuromuscular re-education;Functional mobility training;Therapeutic activities;Therapeutic exercise   PT Goals (Current goals can be found in the Care Plan section) Acute Rehab PT Goals Patient Stated Goal: none stated    Frequency Min 3X/week    End of Session Equipment Utilized During Treatment: Gait belt Activity Tolerance: Patient tolerated treatment well Patient left: in chair;with call bell/phone within reach           Time: 3559-7416 PT Time Calculation (min): 12 min   Charges:   PT Evaluation $Initial PT Evaluation Tier I: 1 Procedure          [place] 08/15/2013, 10:06 AM

## 2013-08-15 NOTE — Care Management Note (Addendum)
    Page 1 of 1   08/15/2013     11:41:25 AM CARE MANAGEMENT NOTE 08/15/2013  Patient:  Helen [name], Helen [name]   Account Number:  [id number]  Date Initiated:  08/15/2013  Documentation initiated by:  Vladimir Creeks  Subjective/Objective Assessment:   Admitted with  PNA.  She lives at home with her son. Pt is fairly independent in her w/c at home. States she goes anywhere she wants to go in the house.     Action/Plan:   Pt refuses HH PT. States she stays in W/C and is independent with transfers. Has had HHPT several times in the past year and does not feel it has helped, so no need to do it again.   Anticipated DC Date:  08/15/2013   Anticipated DC Plan:  [place]  CM consult      Choice offered to / List presented to:          [facility] arranged  [place] - 11 Patient Refused      Status of service:  Completed, signed off Medicare Important Message given?  NA - LOS <3 / Initial given by admissions (If response is "NO", the following Medicare IM given date fields will be blank) Date Medicare IM given:   Medicare IM given by:   Date Additional Medicare IM given:   Additional Medicare IM given by:    Discharge Disposition:  HOME/SELF CARE  Per UR Regulation:  Reviewed for med. necessity/level of care/duration of stay  If discussed at [place] of Stay Meetings, dates discussed:    Comments:  08/15/13 [place] RN/CM

## 2013-08-15 NOTE — Progress Notes (Signed)
Patient states understanding of discharge instructions.  

## 2013-08-16 ENCOUNTER — Telehealth: Payer: Self-pay | Admitting: Pharmacist Clinician (PhC)/ Clinical Pharmacy Specialist

## 2013-08-16 ENCOUNTER — Ambulatory Visit (INDEPENDENT_AMBULATORY_CARE_PROVIDER_SITE_OTHER): Payer: Medicare Other | Admitting: Pharmacist

## 2013-08-16 DIAGNOSIS — I480 Paroxysmal atrial fibrillation: Secondary | ICD-10-CM

## 2013-08-16 DIAGNOSIS — I4891 Unspecified atrial fibrillation: Secondary | ICD-10-CM

## 2013-08-16 DIAGNOSIS — Z7901 Long term (current) use of anticoagulants: Secondary | ICD-10-CM

## 2013-08-16 LAB — POCT INR: INR: 1.8

## 2013-08-16 NOTE — Telephone Encounter (Signed)
Stat INR results----1.8.  Please call patient with instructions.

## 2013-08-16 NOTE — Telephone Encounter (Signed)
Spoke with pt's son.  See coumadin note for dosing details.

## 2013-08-19 ENCOUNTER — Inpatient Hospital Stay: Payer: Medicare Other | Admitting: Physician Assistant

## 2013-08-19 ENCOUNTER — Ambulatory Visit (INDEPENDENT_AMBULATORY_CARE_PROVIDER_SITE_OTHER): Payer: Medicare Other | Admitting: Pharmacist

## 2013-08-19 DIAGNOSIS — I480 Paroxysmal atrial fibrillation: Secondary | ICD-10-CM

## 2013-08-19 DIAGNOSIS — Z7901 Long term (current) use of anticoagulants: Secondary | ICD-10-CM

## 2013-08-19 DIAGNOSIS — I4891 Unspecified atrial fibrillation: Secondary | ICD-10-CM

## 2013-08-19 LAB — POCT INR: INR: 1.5

## 2013-08-21 ENCOUNTER — Encounter: Payer: Self-pay | Admitting: Physician Assistant

## 2013-08-21 ENCOUNTER — Ambulatory Visit (INDEPENDENT_AMBULATORY_CARE_PROVIDER_SITE_OTHER): Payer: Medicare Other | Admitting: Physician Assistant

## 2013-08-21 VITALS — BP 126/64 | HR 60 | Temp 97.4°F | Resp 16

## 2013-08-21 DIAGNOSIS — F0390 Unspecified dementia without behavioral disturbance: Secondary | ICD-10-CM

## 2013-08-21 DIAGNOSIS — I6340 Cerebral infarction due to embolism of unspecified cerebral artery: Secondary | ICD-10-CM

## 2013-08-21 DIAGNOSIS — J189 Pneumonia, unspecified organism: Secondary | ICD-10-CM

## 2013-08-21 DIAGNOSIS — R197 Diarrhea, unspecified: Secondary | ICD-10-CM

## 2013-08-21 DIAGNOSIS — R195 Other fecal abnormalities: Secondary | ICD-10-CM

## 2013-08-21 DIAGNOSIS — Z09 Encounter for follow-up examination after completed treatment for conditions other than malignant neoplasm: Secondary | ICD-10-CM

## 2013-08-21 DIAGNOSIS — I4891 Unspecified atrial fibrillation: Secondary | ICD-10-CM

## 2013-08-21 DIAGNOSIS — I480 Paroxysmal atrial fibrillation: Secondary | ICD-10-CM

## 2013-08-21 DIAGNOSIS — Z7901 Long term (current) use of anticoagulants: Secondary | ICD-10-CM

## 2013-08-21 NOTE — Progress Notes (Signed)
Patient ID: Helen [name] MRN: [id number], DOB: 1924/12/06, 80 y.o. Date of Encounter: @DATE @  Chief Complaint:  Chief Complaint  Patient presents with  . Hospitalization Follow-up    HPI: 80 y.o. year old female  presents with Hispanic Female, who is her caregiver.   She is here to followup after recent hospitalization. Hospitalized 08/14/13 through 08/15/13.  I have reviewed the discharge summary. The issues we are to follow up are as follows:  --Obtain followup chest x-ray--her caregiver states that patient is taking both antibiotics as directed. Discharge summary states that she was "discharged with Ceftin for 7 more days and azithromycin for 5 days. Recommend followup 2 view chest x-ray." --Follow up GI pathogen panel results--results are available now and they are all negative. --Follow up discussion regarding Hemoccult-positive stool--discussed with the caregiver today that the Hemoccult stools were positive. Also discussed that she had no anemia. Hemoglobin 12.1 hematocrit 35.4.  He reports that she had a colonoscopy last year which revealed some polyps.  Also says she has seen gastroenterologist regarding her diarrhea as well as having screening colonoscopy.  Also discussed that she is on Coumadin anticoagulation.  Defers further f/u of this at this time.       Past Medical History  Diagnosis Date  . Stroke 02/2011    large right frontal lobe hemispheric stroke  . Atrial fibrillation   . Diabetes mellitus   . Hypertension   . Coronary artery disease   . Hyperlipidemia 05/25/2011  . Anemia 05/25/2011  . Seizures     Noted at [facility] in Jan 2012, started on Keppra  . Depression   . Arthritis   . TIA (transient ischemic attack) 05/24/2011    2D Echo EF 55%-60%  . Vertigo   . Dementia      Home Meds: Outpatient Prescriptions Prior to Visit  Medication Sig Dispense Refill  . acetaminophen (TYLENOL) 500 MG tablet Take 500 mg by mouth 2 (two) times daily as needed for  pain.      Marland Kitchen atorvastatin (LIPITOR) 20 MG tablet Take 1 tablet (20 mg total) by mouth daily.  90 tablet  3  . azithromycin (ZITHROMAX Z-PAK) 250 MG tablet Take 2 tabs 7/10 then take 1 tab 7/11, 7/12, 7/13, 7/14.  6 each  0  . cefUROXime (CEFTIN) 500 MG tablet Take 1 tablet (500 mg total) by mouth 2 (two) times daily with a meal.  14 tablet  0  . clotrimazole (LOTRIMIN) 1 % cream Apply 1 application topically 2 (two) times daily.  30 g  0  . donepezil (ARICEPT) 10 MG tablet Take 10 mg by mouth at bedtime.      Marland Kitchen escitalopram (LEXAPRO) 5 MG tablet Take 5 mg by mouth daily.      Marland Kitchen glipiZIDE (GLUCOTROL) 5 MG tablet Take 5 mg by mouth daily before breakfast.      . guaiFENesin (MUCINEX) 600 MG 12 hr tablet Take 2 tablets (1,200 mg total) by mouth 2 (two) times daily as needed for cough or to loosen phlegm.      . levETIRAcetam (KEPPRA) 500 MG tablet Take 500 mg by mouth 2 (two) times daily.      Marland Kitchen lisinopril (PRINIVIL,ZESTRIL) 10 MG tablet Take 10 mg by mouth daily.      . memantine (NAMENDA) 10 MG tablet Take 1 tablet (10 mg total) by mouth 2 (two) times daily.  60 tablet  6  . metoprolol tartrate (LOPRESSOR) 25 MG tablet Take 12.5 mg  by mouth 2 (two) times daily.      . mirabegron ER (MYRBETRIQ) 50 MG TB24 Take 50 mg by mouth at bedtime.       . Multiple Vitamins-Minerals (CENTRUM SILVER ULTRA WOMENS PO) Take 1 tablet by mouth every morning. At 700      . potassium chloride (K-DUR) 10 MEQ tablet Take 20 mEq by mouth daily.       . protective barrier (RESTORE) CREA Apply 1 application topically 3 (three) times a week.       . warfarin (COUMADIN) 4 MG tablet Take 4-8 mg by mouth See admin instructions. Takes 4mg  on Monday, Wednesday and Friday.  And 8mg  all other days.      Marland Kitchen zolpidem (AMBIEN) 5 MG tablet Take 5 mg by mouth at bedtime as needed for sleep.       No facility-administered medications prior to visit.    Allergies:  Allergies  Allergen Reactions  . Ciprofloxacin     Patient states  that she may have had a stroke due to this medication  . Niacin And Related Hives  . Statins Other (See Comments)    Some but not all     History   Social History  . Marital Status: Widowed    Spouse Name: N/A    Number of Children: 1  . Years of Education: N/A   Occupational History  . retired, Press photographer    Social History Main Topics  . Smoking status: Never Smoker   . Smokeless tobacco: Not on file  . Alcohol Use: No  . Drug Use: No  . Sexual Activity: Yes    Birth Control/ Protection: Surgical   Other Topics Concern  . 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 Heart disease Father      Review of Systems:  See HPI for pertinent ROS. All other ROS negative.    Physical Exam: Blood pressure 126/64, pulse 60, temperature 97.4 F (36.3 C), temperature source Oral, resp. rate 16., There is no weight on file to calculate BMI. General: WNWD WF In Wheelchair. Appears in no acute distress. Lungs: Clear bilaterally to auscultation without wheezes, rales, or rhonchi. Breathing is unlabored. Heart: Irregular. Musculoskeletal:  Strength and tone normal for age. Extremities/Skin: Warm and dry.  Neuro: Alert and oriented X 3. Moves all extremities spontaneously. Gait is normal. CNII-XII grossly in tact. Psych:  Responds to questions appropriately with a normal affect.     ASSESSMENT AND PLAN:  80 y.o. year old female with  1. Hospital discharge follow-up 2. CAP (community acquired pneumonia) - DG Chest 2 View; Future 3. Heme positive stool 4. Diarrhea 5. Dementia, without behavioral disturbance 6. Anticoagulant long-term use 7. Paroxysmal atrial fibrillation  Complete antibiotics as directed. Obtain followup chest x-ray.   [address] [place], [place], [facility] 08/21/2013 12:01 PM

## 2013-08-22 LAB — POCT INR: INR: 2.1

## 2013-08-22 LAB — PROTIME-INR
INR: 2.1 — AB (ref 0.9–1.1)
INR: 2.1 — AB (ref 0.9–1.1)

## 2013-08-26 ENCOUNTER — Ambulatory Visit (HOSPITAL_COMMUNITY)
Admission: RE | Admit: 2013-08-26 | Discharge: 2013-08-26 | Disposition: A | Discharge status: Home / Self Care | Payer: Medicare Other | Source: Ambulatory Visit | Attending: Physician Assistant | Admitting: Physician Assistant

## 2013-08-26 ENCOUNTER — Other Ambulatory Visit: Payer: Self-pay | Admitting: Physician Assistant

## 2013-08-26 DIAGNOSIS — J189 Pneumonia, unspecified organism: Secondary | ICD-10-CM

## 2013-08-27 ENCOUNTER — Ambulatory Visit (INDEPENDENT_AMBULATORY_CARE_PROVIDER_SITE_OTHER): Payer: Medicare Other | Admitting: Pharmacist Clinician (PhC)/ Clinical Pharmacy Specialist

## 2013-08-27 DIAGNOSIS — I480 Paroxysmal atrial fibrillation: Secondary | ICD-10-CM

## 2013-08-27 DIAGNOSIS — I4891 Unspecified atrial fibrillation: Secondary | ICD-10-CM

## 2013-08-27 DIAGNOSIS — Z7901 Long term (current) use of anticoagulants: Secondary | ICD-10-CM

## 2013-08-30 LAB — POCT INR: INR: 2.2

## 2013-09-02 ENCOUNTER — Ambulatory Visit (INDEPENDENT_AMBULATORY_CARE_PROVIDER_SITE_OTHER): Payer: Medicare Other | Admitting: Pharmacist Clinician (PhC)/ Clinical Pharmacy Specialist

## 2013-09-02 DIAGNOSIS — Z7901 Long term (current) use of anticoagulants: Secondary | ICD-10-CM

## 2013-09-02 DIAGNOSIS — I480 Paroxysmal atrial fibrillation: Secondary | ICD-10-CM

## 2013-09-02 DIAGNOSIS — I4891 Unspecified atrial fibrillation: Secondary | ICD-10-CM

## 2013-09-05 LAB — POCT INR: INR: 2.6

## 2013-09-06 ENCOUNTER — Ambulatory Visit (INDEPENDENT_AMBULATORY_CARE_PROVIDER_SITE_OTHER): Payer: Medicare Other | Admitting: Pharmacist Clinician (PhC)/ Clinical Pharmacy Specialist

## 2013-09-06 DIAGNOSIS — I4891 Unspecified atrial fibrillation: Secondary | ICD-10-CM

## 2013-09-06 DIAGNOSIS — I480 Paroxysmal atrial fibrillation: Secondary | ICD-10-CM

## 2013-09-06 DIAGNOSIS — Z7901 Long term (current) use of anticoagulants: Secondary | ICD-10-CM

## 2013-09-12 LAB — POCT INR: INR: 2.5

## 2013-09-13 ENCOUNTER — Ambulatory Visit (INDEPENDENT_AMBULATORY_CARE_PROVIDER_SITE_OTHER): Payer: Medicare Other | Admitting: Pharmacist Clinician (PhC)/ Clinical Pharmacy Specialist

## 2013-09-13 DIAGNOSIS — I4891 Unspecified atrial fibrillation: Secondary | ICD-10-CM

## 2013-09-13 DIAGNOSIS — I480 Paroxysmal atrial fibrillation: Secondary | ICD-10-CM

## 2013-09-13 DIAGNOSIS — Z7901 Long term (current) use of anticoagulants: Secondary | ICD-10-CM

## 2013-09-19 ENCOUNTER — Telehealth: Payer: Self-pay | Admitting: Cardiovascular Disease

## 2013-09-19 LAB — POCT INR: INR: 3.1

## 2013-09-19 NOTE — Telephone Encounter (Signed)
Helen [name] is calling to give [facility] results for pt.

## 2013-09-19 NOTE — Telephone Encounter (Signed)
Helen [name] is calling to report an out of range INR//

## 2013-09-19 NOTE — Telephone Encounter (Signed)
Made aware that INR TODAY 3.1 Will forward to [place]

## 2013-09-23 ENCOUNTER — Ambulatory Visit (INDEPENDENT_AMBULATORY_CARE_PROVIDER_SITE_OTHER): Payer: Medicare Other | Admitting: Pharmacist Clinician (PhC)/ Clinical Pharmacy Specialist

## 2013-09-23 DIAGNOSIS — I480 Paroxysmal atrial fibrillation: Secondary | ICD-10-CM

## 2013-09-23 DIAGNOSIS — I4891 Unspecified atrial fibrillation: Secondary | ICD-10-CM

## 2013-09-23 DIAGNOSIS — Z7901 Long term (current) use of anticoagulants: Secondary | ICD-10-CM

## 2013-09-26 LAB — POCT INR: INR: 2.4

## 2013-09-27 ENCOUNTER — Ambulatory Visit (INDEPENDENT_AMBULATORY_CARE_PROVIDER_SITE_OTHER): Payer: Medicare Other | Admitting: Pharmacist Clinician (PhC)/ Clinical Pharmacy Specialist

## 2013-09-27 DIAGNOSIS — I4891 Unspecified atrial fibrillation: Secondary | ICD-10-CM

## 2013-09-27 DIAGNOSIS — Z7901 Long term (current) use of anticoagulants: Secondary | ICD-10-CM

## 2013-09-27 DIAGNOSIS — I480 Paroxysmal atrial fibrillation: Secondary | ICD-10-CM

## 2013-10-02 LAB — POCT INR: INR: 2.9

## 2013-10-04 ENCOUNTER — Ambulatory Visit (INDEPENDENT_AMBULATORY_CARE_PROVIDER_SITE_OTHER): Payer: Medicare Other | Admitting: Pharmacist Clinician (PhC)/ Clinical Pharmacy Specialist

## 2013-10-04 DIAGNOSIS — I480 Paroxysmal atrial fibrillation: Secondary | ICD-10-CM

## 2013-10-04 DIAGNOSIS — Z7901 Long term (current) use of anticoagulants: Secondary | ICD-10-CM

## 2013-10-04 DIAGNOSIS — I4891 Unspecified atrial fibrillation: Secondary | ICD-10-CM

## 2013-10-10 LAB — POCT INR: INR: 3.7

## 2013-10-11 ENCOUNTER — Ambulatory Visit (INDEPENDENT_AMBULATORY_CARE_PROVIDER_SITE_OTHER): Payer: Medicare Other | Admitting: Pharmacist Clinician (PhC)/ Clinical Pharmacy Specialist

## 2013-10-11 DIAGNOSIS — I480 Paroxysmal atrial fibrillation: Secondary | ICD-10-CM

## 2013-10-11 DIAGNOSIS — Z7901 Long term (current) use of anticoagulants: Secondary | ICD-10-CM

## 2013-10-11 DIAGNOSIS — I4891 Unspecified atrial fibrillation: Secondary | ICD-10-CM

## 2013-11-15 ENCOUNTER — Ambulatory Visit: Payer: Medicare Other | Admitting: Family Medicine

## 2014-02-07 DEATH — deceased

## 2015-11-06 ENCOUNTER — Ambulatory Visit: Payer: Self-pay | Admitting: Pharmacist Clinician (PhC)/ Clinical Pharmacy Specialist

## 2015-11-06 DIAGNOSIS — Z7901 Long term (current) use of anticoagulants: Secondary | ICD-10-CM

## 2015-11-06 DIAGNOSIS — I480 Paroxysmal atrial fibrillation: Secondary | ICD-10-CM
